# Patient Record
Sex: Male | Born: 1940 | Race: White | Hispanic: No | Smoking: Former smoker
Health system: Southern US, Community
[De-identification: ages and names within clinical notes are randomized; demographics above are authoritative.]

## PROBLEM LIST (undated history)

## (undated) DIAGNOSIS — I219 Acute myocardial infarction, unspecified: Secondary | ICD-10-CM

## (undated) DIAGNOSIS — I739 Peripheral vascular disease, unspecified: Secondary | ICD-10-CM

## (undated) DIAGNOSIS — I639 Cerebral infarction, unspecified: Secondary | ICD-10-CM

## (undated) DIAGNOSIS — T82218A Other mechanical complication of coronary artery bypass graft, initial encounter: Secondary | ICD-10-CM

## (undated) DIAGNOSIS — J449 Chronic obstructive pulmonary disease, unspecified: Secondary | ICD-10-CM

## (undated) DIAGNOSIS — F17200 Nicotine dependence, unspecified, uncomplicated: Secondary | ICD-10-CM

## (undated) DIAGNOSIS — J309 Allergic rhinitis, unspecified: Secondary | ICD-10-CM

## (undated) DIAGNOSIS — I10 Essential (primary) hypertension: Secondary | ICD-10-CM

## (undated) DIAGNOSIS — I4891 Unspecified atrial fibrillation: Secondary | ICD-10-CM

## (undated) DIAGNOSIS — R51 Headache: Secondary | ICD-10-CM

## (undated) DIAGNOSIS — K5909 Other constipation: Secondary | ICD-10-CM

## (undated) DIAGNOSIS — I499 Cardiac arrhythmia, unspecified: Secondary | ICD-10-CM

## (undated) DIAGNOSIS — E785 Hyperlipidemia, unspecified: Secondary | ICD-10-CM

## (undated) DIAGNOSIS — M1990 Unspecified osteoarthritis, unspecified site: Secondary | ICD-10-CM

## (undated) DIAGNOSIS — E871 Hypo-osmolality and hyponatremia: Secondary | ICD-10-CM

## (undated) DIAGNOSIS — R0602 Shortness of breath: Secondary | ICD-10-CM

## (undated) DIAGNOSIS — I2510 Atherosclerotic heart disease of native coronary artery without angina pectoris: Secondary | ICD-10-CM

## (undated) DIAGNOSIS — C801 Malignant (primary) neoplasm, unspecified: Secondary | ICD-10-CM

## (undated) DIAGNOSIS — M549 Dorsalgia, unspecified: Secondary | ICD-10-CM

## (undated) DIAGNOSIS — K219 Gastro-esophageal reflux disease without esophagitis: Secondary | ICD-10-CM

## (undated) DIAGNOSIS — N200 Calculus of kidney: Secondary | ICD-10-CM

## (undated) HISTORY — DX: Hypo-osmolality and hyponatremia: E87.1

## (undated) HISTORY — DX: Atherosclerotic heart disease of native coronary artery without angina pectoris: I25.10

## (undated) HISTORY — DX: Hyperlipidemia, unspecified: E78.5

## (undated) HISTORY — DX: Unspecified atrial fibrillation: I48.91

## (undated) HISTORY — DX: Other constipation: K59.09

## (undated) HISTORY — DX: Cerebral infarction, unspecified: I63.9

## (undated) HISTORY — DX: Peripheral vascular disease, unspecified: I73.9

## (undated) HISTORY — PX: HEMORROIDECTOMY: SUR656

## (undated) HISTORY — DX: Nicotine dependence, unspecified, uncomplicated: F17.200

## (undated) HISTORY — DX: Headache: R51

## (undated) HISTORY — DX: Allergic rhinitis, unspecified: J30.9

## (undated) HISTORY — DX: Dorsalgia, unspecified: M54.9

## (undated) HISTORY — DX: Calculus of kidney: N20.0

---

## 1979-06-19 HISTORY — PX: MULTIPLE TOOTH EXTRACTIONS: SHX2053

## 1989-06-18 HISTORY — PX: KNEE SURGERY: SHX244

## 2008-08-22 ENCOUNTER — Inpatient Hospital Stay: Payer: Self-pay | Admitting: Internal Medicine

## 2010-04-07 ENCOUNTER — Ambulatory Visit: Payer: Self-pay | Admitting: Family Medicine

## 2012-08-17 DIAGNOSIS — E7800 Pure hypercholesterolemia, unspecified: Secondary | ICD-10-CM | POA: Insufficient documentation

## 2012-08-17 DIAGNOSIS — R319 Hematuria, unspecified: Secondary | ICD-10-CM | POA: Insufficient documentation

## 2012-08-17 DIAGNOSIS — Z0189 Encounter for other specified special examinations: Secondary | ICD-10-CM | POA: Insufficient documentation

## 2012-08-17 DIAGNOSIS — Z720 Tobacco use: Secondary | ICD-10-CM | POA: Insufficient documentation

## 2012-08-17 DIAGNOSIS — M549 Dorsalgia, unspecified: Secondary | ICD-10-CM | POA: Insufficient documentation

## 2012-08-17 DIAGNOSIS — J301 Allergic rhinitis due to pollen: Secondary | ICD-10-CM | POA: Insufficient documentation

## 2012-08-17 DIAGNOSIS — J449 Chronic obstructive pulmonary disease, unspecified: Secondary | ICD-10-CM | POA: Insufficient documentation

## 2012-08-17 DIAGNOSIS — I10 Essential (primary) hypertension: Secondary | ICD-10-CM | POA: Insufficient documentation

## 2012-08-17 DIAGNOSIS — I639 Cerebral infarction, unspecified: Secondary | ICD-10-CM | POA: Insufficient documentation

## 2012-08-17 DIAGNOSIS — N529 Male erectile dysfunction, unspecified: Secondary | ICD-10-CM | POA: Insufficient documentation

## 2012-10-18 HISTORY — PX: CARDIAC CATHETERIZATION: SHX172

## 2013-03-07 ENCOUNTER — Ambulatory Visit: Payer: Self-pay | Admitting: Family Medicine

## 2013-07-16 ENCOUNTER — Ambulatory Visit (INDEPENDENT_AMBULATORY_CARE_PROVIDER_SITE_OTHER): Payer: Medicare Other | Admitting: Cardiovascular Disease

## 2013-07-16 ENCOUNTER — Encounter: Payer: Self-pay | Admitting: Cardiovascular Disease

## 2013-07-16 VITALS — BP 106/80 | HR 84 | Ht 69.0 in | Wt 160.5 lb

## 2013-07-16 DIAGNOSIS — I739 Peripheral vascular disease, unspecified: Secondary | ICD-10-CM | POA: Insufficient documentation

## 2013-07-16 DIAGNOSIS — F17200 Nicotine dependence, unspecified, uncomplicated: Secondary | ICD-10-CM

## 2013-07-16 DIAGNOSIS — R079 Chest pain, unspecified: Secondary | ICD-10-CM | POA: Insufficient documentation

## 2013-07-16 DIAGNOSIS — I10 Essential (primary) hypertension: Secondary | ICD-10-CM

## 2013-07-16 DIAGNOSIS — R0602 Shortness of breath: Secondary | ICD-10-CM

## 2013-07-16 DIAGNOSIS — E785 Hyperlipidemia, unspecified: Secondary | ICD-10-CM | POA: Insufficient documentation

## 2013-07-16 NOTE — Assessment & Plan Note (Signed)
Known lower extremity PAD. Followed by Dr. Lorretta Harp. Recommended smoking cessation

## 2013-07-16 NOTE — Patient Instructions (Addendum)
[facility] Cardiac Cath Instructions   You are scheduled for a Cardiac Cath on:_____THURSDAY, OCT 2____________________  Please arrive at __10:30___am on the day of your procedure  You will need to pre-register prior to the day of your procedure.  Enter through the [employer] at [facility].  Registration is the first desk on your right.  Please take the procedure order we have given you in order to be registered appropriately  Do not eat/drink anything after midnight  Someone will need to drive you home  It is recommended someone be with you for the first 24 hours after your procedure  Wear clothes that are easy to get on/off and wear slip on shoes if possible   Medications bring a current list of all medications with you   ___ Do not take these medications before your procedure:___LASIX_____   Day of your procedure: Arrive at the [facility] entrance.  Free valet service is available.  After entering the [facility] please check-in at the registration desk (1st desk on your right) to receive your armband. After receiving your armband someone will escort you to the cardiac cath/special procedures waiting area.  The usual length of stay after your procedure is about 2 to 3 hours.  This can vary.  If you have any questions, please call our office at [phone], or you may call the cardiac cath lab at [facility] directly at [phone]

## 2013-07-16 NOTE — Assessment & Plan Note (Signed)
We have encouraged him to continue to work on weaning his cigarettes and smoking cessation. He will continue to work on this and does not want any assistance with chantix.  

## 2013-07-16 NOTE — Assessment & Plan Note (Addendum)
Chest pain concerning for ischemia also in light of his long smoking history, known PAD, EKG abnormality. We have discussed the various options. He would prefer heart attack catheterization over a stress test. He is unable to treadmill. Catheterization will be scheduled for later this week. Risks and benefits were discussed with the patient. Chest x-ray lab work ordered today in preparation for catheterization.

## 2013-07-16 NOTE — Progress Notes (Signed)
Patient ID: Jerry Davenport, male    DOB: Sep 08, 1941, 72 y.o.   MRN: [id number]  HPI Comments: Jerry Davenport is a very pleasant 72 year old gentleman with a long history of smoking for more than 60 years,  hyperlipidemia, chronic headaches, PAD in the legs, works as a Hospital doctor, significant home stress (issues with his son ), who presents by referral from Dr. Juanetta Gosling for symptoms of chest pain and abnormal EKG.   Reports having episodes of chest pain lasting for a few seconds at a time dating back several weeks to months. He is uncertain if they come on with rest or stress. In general he is not very active secondary to underlying shortness of breath. He continues to smoke one pack per day. He is limited in his ability to exert himself secondary to PAD in his legs. He reports that his legs give out. He is followed by Dr. Lorretta Harp.   Chest pain is in his mediastinum, typically does not radiate down his arms or in his neck. Symptoms will resolve if he rests. 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 acetaminophen (TYLENOL) 500 MG tablet Take 500 mg by mouth as needed for pain.      Marland Kitchen aspirin 81 MG tablet Take 81 mg by mouth daily.      . furosemide (LASIX) 20 MG tablet Take 10 mg by mouth daily.      Marland Kitchen HYDROcodone-acetaminophen (NORCO/VICODIN) 5-325 MG per tablet Take 1 tablet by mouth as needed for pain.      . Loratadine (CLARITIN) 10 MG CAPS Take 5 mg by mouth daily.       . nitroGLYCERIN (NITROSTAT) 0.4 MG SL tablet Place 0.4 mg under  the tongue every 5 (five) minutes as needed for chest pain.      . pravastatin (PRAVACHOL) 40 MG tablet Take 40 mg by mouth daily.       No facility-administered encounter medications on file as of 07/16/2013.     Review of Systems  Constitutional: Negative.   HENT: Negative.   Eyes: Negative.   Respiratory: Positive for chest tightness and shortness of breath.   Cardiovascular: Positive for chest pain.  Gastrointestinal: Negative.   Endocrine: Negative.   Musculoskeletal: Negative.   Skin: Negative.   Allergic/Immunologic: Negative.   Neurological: Negative.   Hematological: Negative.   Psychiatric/Behavioral: Negative.   All other systems reviewed and are negative.    BP 106/80  Pulse 84  Ht 5\' 9"  (1.753 m)  Wt 160 lb 8 oz (72.802 kg)  BMI 23.69 kg/m2  Physical Exam  Nursing note and vitals reviewed. Constitutional: He is oriented to person, place, and time. He appears well-developed and well-nourished.  HENT:  Head: Normocephalic.  Nose: Nose normal.  Mouth/Throat: Oropharynx is clear and moist.  Eyes: Conjunctivae are normal. Pupils are equal, round, and reactive to light.  Neck: Normal range of motion. Neck supple. No JVD present.  Cardiovascular: Normal rate, regular rhythm, S1 normal, S2 normal,  normal heart sounds and intact distal pulses.  Exam reveals no gallop and no friction rub.   No murmur heard. Pulses:      Dorsalis pedis pulses are 0 on the right side, and 0 on the left side.       Posterior tibial pulses are 0 on the right side, and 0 on the left side.  Pulmonary/Chest: Effort normal. No respiratory distress. He has decreased breath sounds. He has no wheezes. He has no rales. He exhibits no tenderness.  Abdominal: Soft. Bowel sounds are normal. He exhibits no distension. There is no tenderness.  Musculoskeletal: Normal range of motion. He exhibits no edema and no tenderness.  Lymphadenopathy:    He has no cervical adenopathy.  Neurological: He is  alert and oriented to person, place, and time. Coordination normal.  Skin: Skin is warm and dry. No rash noted. No erythema.  Psychiatric: He has a normal mood and affect. His behavior is normal. Judgment and thought content normal.      Assessment and Plan

## 2013-07-16 NOTE — Assessment & Plan Note (Signed)
Encouraged him to stay on his pravastatin 

## 2013-07-17 ENCOUNTER — Other Ambulatory Visit: Payer: Self-pay

## 2013-07-17 DIAGNOSIS — R079 Chest pain, unspecified: Secondary | ICD-10-CM

## 2013-07-17 DIAGNOSIS — R0602 Shortness of breath: Secondary | ICD-10-CM

## 2013-07-17 LAB — CBC WITH DIFFERENTIAL/PLATELET
Basophils Absolute: 0.1 10*3/uL (ref 0.0–0.2)
Basos: 1 %
Immature Grans (Abs): 0 10*3/uL (ref 0.0–0.1)
Immature Granulocytes: 0 %
Lymphs: 24 %
MCHC: 35.7 g/dL (ref 31.5–35.7)
Monocytes: 4 %
Neutrophils Relative %: 66 %
RDW: 15.8 % — ABNORMAL HIGH (ref 12.3–15.4)

## 2013-07-17 LAB — BASIC METABOLIC PANEL
BUN/Creatinine Ratio: 16 (ref 10–22)
BUN: 15 mg/dL (ref 8–27)
CO2: 24 mmol/L (ref 18–29)
Calcium: 9.2 mg/dL (ref 8.6–10.2)
Chloride: 104 mmol/L (ref 97–108)
Creatinine, Ser: 0.93 mg/dL (ref 0.76–1.27)
GFR calc non Af Amer: 81 mL/min/{1.73_m2} (ref >59)
Glucose: 114 mg/dL — ABNORMAL HIGH (ref 65–99)

## 2013-07-17 LAB — PROTIME-INR
INR: 1.1 (ref 0.8–1.2)
Prothrombin Time: 10.8 s (ref 9.1–12.0)

## 2013-07-18 DIAGNOSIS — T82218A Other mechanical complication of coronary artery bypass graft, initial encounter: Secondary | ICD-10-CM

## 2013-07-18 HISTORY — DX: Other mechanical complication of coronary artery bypass graft, initial encounter: T82.218A

## 2013-07-19 ENCOUNTER — Ambulatory Visit: Payer: Self-pay | Admitting: Cardiovascular Disease

## 2013-07-19 ENCOUNTER — Encounter: Payer: Self-pay | Admitting: Cardiovascular Disease

## 2013-07-19 DIAGNOSIS — I2510 Atherosclerotic heart disease of native coronary artery without angina pectoris: Secondary | ICD-10-CM

## 2013-07-19 DIAGNOSIS — I739 Peripheral vascular disease, unspecified: Secondary | ICD-10-CM

## 2013-07-26 ENCOUNTER — Encounter: Payer: Self-pay | Admitting: Cardiovascular Disease

## 2013-07-26 ENCOUNTER — Ambulatory Visit (INDEPENDENT_AMBULATORY_CARE_PROVIDER_SITE_OTHER): Payer: Medicare Other | Admitting: Cardiovascular Disease

## 2013-07-26 VITALS — BP 120/74 | HR 72 | Ht 69.0 in | Wt 158.5 lb

## 2013-07-26 DIAGNOSIS — I2510 Atherosclerotic heart disease of native coronary artery without angina pectoris: Secondary | ICD-10-CM

## 2013-07-26 DIAGNOSIS — E785 Hyperlipidemia, unspecified: Secondary | ICD-10-CM

## 2013-07-26 DIAGNOSIS — I739 Peripheral vascular disease, unspecified: Secondary | ICD-10-CM

## 2013-07-26 DIAGNOSIS — R0602 Shortness of breath: Secondary | ICD-10-CM

## 2013-07-26 DIAGNOSIS — F17200 Nicotine dependence, unspecified, uncomplicated: Secondary | ICD-10-CM

## 2013-07-26 DIAGNOSIS — R079 Chest pain, unspecified: Secondary | ICD-10-CM

## 2013-07-26 MED ORDER — METOPROLOL TARTRATE 25 MG PO TABS: 25.0000 mg | ORAL_TABLET | Freq: Two times a day (BID) | ORAL | Status: DC

## 2013-07-26 NOTE — Assessment & Plan Note (Signed)
Recommended he continue on a statin. Goal LDL less than 70.

## 2013-07-26 NOTE — Assessment & Plan Note (Signed)
Severe CAD on recent cardiac catheterization. We have discussed this with him and his various options. He would likely best be served by evaluation from cardiothoracic surgery for bypass surgery. We'll try to obtain his most recent carotid ultrasound from Dr. Lorretta Harp. He is interested in getting back to work as he supports his family. We will start metoprolol 12.5 mg twice a day, continue aspirin and statin.

## 2013-07-26 NOTE — Patient Instructions (Addendum)
Please start metoprolol 1/2 pill twice a day  We will set up an appt with the cardiothorasic surgeons for possible bypass surgery  Please call us if you have new issues that need to be addressed before your next appt.  Your physician wants you to follow-up in: 6 weeks  You will receive a reminder letter in the mail two months in advance. If you don't receive a letter, please call our office to schedule the follow-up appointment.

## 2013-07-26 NOTE — Assessment & Plan Note (Signed)
Followed by Dr. Schneir 

## 2013-07-26 NOTE — Assessment & Plan Note (Addendum)
Strongly recommended he quit smoking. He realizes this and will continue to try.

## 2013-07-26 NOTE — Assessment & Plan Note (Addendum)
Underlying shortness of breath from COPD and underlying ischemia. Currently not on inhalers.

## 2013-07-26 NOTE — Progress Notes (Signed)
Patient ID: Jerry Davenport, male    DOB: November 19, 1940, 72 y.o.   MRN: [id number]  HPI Comments: Jerry Davenport is a very pleasant 72 year old gentleman with a long history of smoking for more than 60 years, who continues to smoke (though trying to quit),  hyperlipidemia, chronic headaches, PAD in the legs followed by Dr. 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 Juanetta Gosling who initially presented with symptoms of chest pain and abnormal EKG.   Jerry Davenport reported having episodes of chest pain lasting for a few seconds at a time dating back several weeks to months. In general Jerry Davenport is not very active secondary to underlying shortness of breath. Chest pain is in his mediastinum, typically does not radiate down his arms or in his neck. Symptoms will resolve if Jerry Davenport rests.   Recent cardiac catheterization for his symptoms confirmed severe three-vessel disease. Jerry Davenport had occluded mid RCA, occluded mid left circumflex, patent ramus and patent LAD with severe stenotic lesion in the proximal to mid LAD estimated at 80%. It collaterals extending to the distal RCA and left circumflex. Images were shown to Dr. Kirke Corin and after a long discussion, decision was made for referral to cardiothoracic surgery. At the time of the catheterization Jerry Davenport was unsure and wanted to think about the decision.  Jerry Davenport presents today for further discussion.   Jerry Davenport recovered well from his cardiac catheterization. No significant groin bruising.  EKG shows normal sinus rhythm with rate 72 beats per minute, nonspecific ST abnormality in V5, V6, T wave abnormality 2, 3, aVF    Outpatient Encounter Prescriptions as of 07/26/2013  Medication Sig Dispense Refill  . acetaminophen (TYLENOL) 500 MG tablet Take 500 mg by mouth as needed for pain.      Marland Kitchen aspirin 81 MG tablet Take 81 mg by mouth daily.      . furosemide (LASIX) 20 MG tablet Take 10 mg by mouth daily.      Marland Kitchen HYDROcodone-acetaminophen  (NORCO/VICODIN) 5-325 MG per tablet Take 1 tablet by mouth as needed for pain.      . Loratadine (CLARITIN) 10 MG CAPS Take 5 mg by mouth daily.       . nitroGLYCERIN (NITROSTAT) 0.4 MG SL tablet Place 0.4 mg under the tongue every 5 (five) minutes as needed for chest pain.      . pravastatin (PRAVACHOL) 40 MG tablet Take 40 mg by mouth daily.       No facility-administered encounter medications on file as of 07/26/2013.     Review of Systems  Constitutional: Negative.   HENT: Negative.   Eyes: Negative.   Respiratory: Positive for chest tightness and shortness of breath.   Cardiovascular: Positive for chest pain.  Gastrointestinal: Negative.   Endocrine: Negative.   Musculoskeletal: Negative.   Skin: Negative.   Allergic/Immunologic: Negative.   Neurological: Negative.   Hematological: Negative.   Psychiatric/Behavioral: Negative.   All other systems reviewed and are negative.    BP 120/74  Pulse 72  Ht 5\' 9"  (1.753 m)  Wt 158 lb 8 oz (71.895 kg)  BMI 23.4 kg/m2  Physical Exam  Nursing note and vitals reviewed. Constitutional: Jerry Davenport is oriented to person, place, and time. Jerry Davenport appears well-developed and well-nourished.  HENT:  Head: Normocephalic.  Nose: Nose normal.  Mouth/Throat: Oropharynx is clear and moist.  Eyes: Conjunctivae are normal. Pupils are equal, round, and reactive to light.  Neck: Normal range of motion. Neck supple.  No JVD present.  Cardiovascular: Normal rate, regular rhythm, S1 normal, S2 normal, normal heart sounds and intact distal pulses.  Exam reveals no gallop and no friction rub.   No murmur heard. Pulses:      Dorsalis pedis pulses are 0 on the right side, and 0 on the left side.       Posterior tibial pulses are 0 on the right side, and 0 on the left side.  Pulmonary/Chest: Effort normal. No respiratory distress. Jerry Davenport has decreased breath sounds. Jerry Davenport has no wheezes. Jerry Davenport has no rales. Jerry Davenport exhibits no tenderness.  Abdominal: Soft. Bowel sounds are  normal. Jerry Davenport exhibits no distension. There is no tenderness.  Musculoskeletal: Normal range of motion. Jerry Davenport exhibits no edema and no tenderness.  Lymphadenopathy:    Jerry Davenport has no cervical adenopathy.  Neurological: Jerry Davenport is alert and oriented to person, place, and time. Coordination normal.  Skin: Skin is warm and dry. No rash noted. No erythema.  Psychiatric: Jerry Davenport has a normal mood and affect. His behavior is normal. Judgment and thought content normal.      Assessment and Plan

## 2013-07-26 NOTE — Assessment & Plan Note (Signed)
He continues to have chest pain symptoms from underlying ischemia. Will refer to CT surgery.

## 2013-08-07 ENCOUNTER — Encounter: Payer: Self-pay | Admitting: Thoracic Surgery (Cardiothoracic Vascular Surgery)

## 2013-08-07 ENCOUNTER — Other Ambulatory Visit: Payer: Self-pay

## 2013-08-07 ENCOUNTER — Institutional Professional Consult (permissible substitution) (INDEPENDENT_AMBULATORY_CARE_PROVIDER_SITE_OTHER): Payer: Medicare Other | Admitting: Thoracic Surgery (Cardiothoracic Vascular Surgery)

## 2013-08-07 VITALS — BP 113/72 | HR 71 | Resp 16 | Ht 69.0 in | Wt 158.0 lb

## 2013-08-07 DIAGNOSIS — I2510 Atherosclerotic heart disease of native coronary artery without angina pectoris: Secondary | ICD-10-CM

## 2013-08-07 DIAGNOSIS — R319 Hematuria, unspecified: Secondary | ICD-10-CM | POA: Insufficient documentation

## 2013-08-07 DIAGNOSIS — J449 Chronic obstructive pulmonary disease, unspecified: Secondary | ICD-10-CM

## 2013-08-07 NOTE — Progress Notes (Signed)
PCP is HAWKINS,JAMES H, MD Referring Provider is Gollan, Timothy J, MD  Chief Complaint  Patient presents with  . Coronary Artery Disease    Surgical eval for severe 3 vessel disease, Cardiac Cath 07/19/13    HPI: 72-year-old gentleman presents for evaluation of three-vessel coronary disease.  Jerry Davenport is a 72-year-old gentleman who recently had a DOT physical and was found to have been initiated on. He went to Dr. Hawkins and as part of his evaluation had an EKG. 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 He was referred to Dr. Gollan and a cardiac catheterization. That revealed severe three-vessel coronary disease with an 80% LAD stenosis and total occlusion of the right coronary and left circumflex, which filled via collaterals from the LAD. Left ventriculogram showed inferior akinesis.  Jerry Davenport is a poor historian. He says that he has occasional chest pain. He describes this as a sharp pain just to the left of the sternum that comes on unpredictably in the last up to 15 minutes. He does get short of breath with exertion and sometimes has to stop while walking into the grocery store. He smoked about a pack a day for 60 years but says he has been trying to cut back. He currently is smoking 4-5 cigarettes a day.     Past Medical History  Diagnosis Date  . Hyposmolality and/or hyponatremia   . Other and unspecified hyperlipidemia   . Headache(784.0)   . Backache, unspecified   . Allergic rhinitis, cause unspecified   . Peripheral vascular disease, unspecified   . Tobacco use disorder   . Other constipation   . Stroke   . Coronary artery disease     Past Surgical History  Procedure Laterality Date  . Hemorroidectomy    . Cardiac catheterization  2014    [facility]    Family History  Problem Relation Age of Onset  . Hypertension Mother     Social History History  Substance Use Topics  . Smoking status: Current Every Day Smoker -- 1.00 packs/day for 60 years    Types: Cigarettes  .  Smokeless tobacco: Never Used     Comment: down to 2-4 cigs a day now  . Alcohol Use: No    Current Outpatient Prescriptions  Medication Sig Dispense Refill  . acetaminophen (TYLENOL) 500 MG tablet Take 500 mg by mouth as needed for pain.      . aspirin 81 MG tablet Take 81 mg by mouth daily.      . furosemide (LASIX) 20 MG tablet Take 10 mg by mouth daily.      . HYDROcodone-acetaminophen (NORCO/VICODIN) 5-325 MG per tablet Take 1 tablet by mouth as needed for pain.      . Loratadine (CLARITIN) 10 MG CAPS Take 5 mg by mouth daily as needed.       . metoprolol tartrate (LOPRESSOR) 25 MG tablet Take 12.5 mg by mouth 2 (two) times daily.      . nitroGLYCERIN (NITROSTAT) 0.4 MG SL tablet Place 0.4 mg under the tongue every 5 (five) minutes as needed for chest pain.      . pravastatin (PRAVACHOL) 40 MG tablet Take 40 mg by mouth daily.       No current facility-administered medications for this visit.    No Known Allergies  Review of Systems  Constitutional: Positive for fatigue.  Respiratory: Positive for cough, shortness of breath and wheezing.   Cardiovascular: Positive for chest pain.       "blocked artery   right leg"  Genitourinary: Positive for hematuria.  Musculoskeletal:       Right hip pain  Neurological: Negative.   Hematological: Does not bruise/bleed easily.  All other systems reviewed and are negative.    BP 113/72  Pulse 71  Resp 16  Ht 5' 9" (1.753 m)  Wt 158 lb (71.668 kg)  BMI 23.32 kg/m2  SpO2 98% Physical Exam  Vitals reviewed. Constitutional: He is oriented to person, place, and time. He appears well-developed and well-nourished. No distress.  HENT:  Head: Normocephalic and atraumatic.  Eyes: Pupils are equal, round, and reactive to light.  Neck: Normal range of motion. Neck supple. No thyromegaly present.  No carotid bruits  Cardiovascular: Normal rate and regular rhythm.   Difficult to auscultate HS, no murmur noted  Pulmonary/Chest: Effort normal.  He has no wheezes. He has no rales.  Diminished BS bilaterally  Abdominal: Soft. There is no tenderness.  Musculoskeletal:  +clubbing, no cyanosis or edema  Lymphadenopathy:    He has no cervical adenopathy.  Neurological: He is alert and oriented to person, place, and time. No cranial nerve deficit.  No focal motor deficit  Skin: Skin is warm and dry.     Diagnostic Tests: Cardiac catheterization as previously noted  CBC    Component Value Date/Time   WBC 6.0 07/16/2013 0907   RBC 3.35* 07/16/2013 0907   HGB 13.0 07/16/2013 0907   HCT 36.4* 07/16/2013 0907   MCV 109* 07/16/2013 0907   MCH 38.8* 07/16/2013 0907   MCHC 35.7 07/16/2013 0907   RDW 15.8* 07/16/2013 0907   LYMPHSABS 1.4 07/16/2013 0907   EOSABS 0.3 07/16/2013 0907   BASOSABS 0.1 07/16/2013 0907    BMET    Component Value Date/Time   NA 140 07/16/2013 0907   K 4.8 07/16/2013 0907   CL 104 07/16/2013 0907   CO2 24 07/16/2013 0907   GLUCOSE 114* 07/16/2013 0907   BUN 15 07/16/2013 0907   CREATININE 0.93 07/16/2013 0907   CALCIUM 9.2 07/16/2013 0907   GFRNONAA 81 07/16/2013 0907   GFRAA 94 07/16/2013 0907     Impression: Jerry Davenport is a 72-year-old gentleman who has a history of tobacco abuse and hypertension and who has severe three-vessel coronary disease with total occlusion of the circumflex and right coronary and 80% stenosis in his LAD. 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  Coronary bypass grafting is indicated for survival benefit. Relief of symptoms is a little harder to predict as he is a poor historian. I'm not sure that the chest pain he describes anginal in nature. There is a high likelihood however that his coronary disease is contributing to his shortness of breath.  I did counsel him on smoking cessation.  I discussed with the patient and his wife the general nature of the procedure, the need for general anesthesia, and the incisions to be used. I discussed the expected hospital stay,  overall recovery and short and long term outcomes. They understand the risks include, but are not limited to, death, stroke, MI, DVT/PE, bleeding, possible need for transfusion, infections, cardiac arrhythmias, and other organ system dysfunction including respiratory, renal, or GI complications. He accepts these risks and agrees to proceed.   We will attempt to harvest the vein from his left leg. I'm not sure the symptoms he describes with his left hip his claudication, so if necessary we will take vein from the right leg as well.      Plan: Coronary bypass grafting x4 on Monday, October 27  He does need pulmonary function tests preoperatively so we can assess his COPD.  

## 2013-08-08 ENCOUNTER — Encounter (HOSPITAL_COMMUNITY): Payer: Self-pay | Admitting: Pharmacy Technician

## 2013-08-09 ENCOUNTER — Encounter (HOSPITAL_COMMUNITY)
Admission: RE | Admit: 2013-08-09 | Discharge: 2013-08-09 | Disposition: A | Discharge status: Home / Self Care | Payer: Medicare Other | Source: Ambulatory Visit | Attending: Thoracic Surgery (Cardiothoracic Vascular Surgery) | Admitting: Thoracic Surgery (Cardiothoracic Vascular Surgery)

## 2013-08-09 ENCOUNTER — Ambulatory Visit (HOSPITAL_COMMUNITY)
Admission: RE | Admit: 2013-08-09 | Discharge: 2013-08-09 | Disposition: A | Discharge status: Home / Self Care | Payer: Medicare Other | Source: Ambulatory Visit | Attending: Thoracic Surgery (Cardiothoracic Vascular Surgery) | Admitting: Thoracic Surgery (Cardiothoracic Vascular Surgery)

## 2013-08-09 ENCOUNTER — Encounter (HOSPITAL_COMMUNITY): Payer: Self-pay

## 2013-08-09 VITALS — BP 130/81 | HR 66 | Temp 97.7°F | Resp 20 | Ht 69.0 in | Wt 156.5 lb

## 2013-08-09 DIAGNOSIS — Z01818 Encounter for other preprocedural examination: Secondary | ICD-10-CM | POA: Insufficient documentation

## 2013-08-09 DIAGNOSIS — Z01810 Encounter for preprocedural cardiovascular examination: Secondary | ICD-10-CM | POA: Insufficient documentation

## 2013-08-09 DIAGNOSIS — R0609 Other forms of dyspnea: Secondary | ICD-10-CM | POA: Insufficient documentation

## 2013-08-09 DIAGNOSIS — I2510 Atherosclerotic heart disease of native coronary artery without angina pectoris: Secondary | ICD-10-CM | POA: Insufficient documentation

## 2013-08-09 DIAGNOSIS — R0989 Other specified symptoms and signs involving the circulatory and respiratory systems: Secondary | ICD-10-CM | POA: Insufficient documentation

## 2013-08-09 DIAGNOSIS — R9431 Abnormal electrocardiogram [ECG] [EKG]: Secondary | ICD-10-CM | POA: Insufficient documentation

## 2013-08-09 DIAGNOSIS — I6529 Occlusion and stenosis of unspecified carotid artery: Secondary | ICD-10-CM | POA: Insufficient documentation

## 2013-08-09 DIAGNOSIS — I739 Peripheral vascular disease, unspecified: Secondary | ICD-10-CM | POA: Insufficient documentation

## 2013-08-09 DIAGNOSIS — F17200 Nicotine dependence, unspecified, uncomplicated: Secondary | ICD-10-CM | POA: Insufficient documentation

## 2013-08-09 DIAGNOSIS — R079 Chest pain, unspecified: Secondary | ICD-10-CM | POA: Insufficient documentation

## 2013-08-09 DIAGNOSIS — K219 Gastro-esophageal reflux disease without esophagitis: Secondary | ICD-10-CM | POA: Insufficient documentation

## 2013-08-09 DIAGNOSIS — I10 Essential (primary) hypertension: Secondary | ICD-10-CM | POA: Insufficient documentation

## 2013-08-09 DIAGNOSIS — J438 Other emphysema: Secondary | ICD-10-CM | POA: Insufficient documentation

## 2013-08-09 DIAGNOSIS — Z01812 Encounter for preprocedural laboratory examination: Secondary | ICD-10-CM | POA: Insufficient documentation

## 2013-08-09 DIAGNOSIS — E785 Hyperlipidemia, unspecified: Secondary | ICD-10-CM | POA: Insufficient documentation

## 2013-08-09 HISTORY — DX: Acute myocardial infarction, unspecified: I21.9

## 2013-08-09 HISTORY — DX: Shortness of breath: R06.02

## 2013-08-09 HISTORY — DX: Unspecified osteoarthritis, unspecified site: M19.90

## 2013-08-09 HISTORY — DX: Essential (primary) hypertension: I10

## 2013-08-09 HISTORY — DX: Gastro-esophageal reflux disease without esophagitis: K21.9

## 2013-08-09 LAB — PULMONARY FUNCTION TEST

## 2013-08-09 LAB — URINALYSIS, ROUTINE W REFLEX MICROSCOPIC
Bilirubin Urine: NEGATIVE
Glucose, UA: NEGATIVE mg/dL
Ketones, ur: NEGATIVE mg/dL
Leukocytes, UA: NEGATIVE
Nitrite: NEGATIVE
Protein, ur: NEGATIVE mg/dL
Specific Gravity, Urine: 1.007 (ref 1.005–1.030)
Urobilinogen, UA: 1 mg/dL (ref 0.0–1.0)
pH: 7 (ref 5.0–8.0)

## 2013-08-09 LAB — COMPREHENSIVE METABOLIC PANEL
ALT: 11 U/L (ref 0–53)
AST: 13 U/L (ref 0–37)
Albumin: 4 g/dL (ref 3.5–5.2)
Alkaline Phosphatase: 90 U/L (ref 39–117)
CO2: 23 mEq/L (ref 19–32)
Chloride: 101 mEq/L (ref 96–112)
GFR calc non Af Amer: 69 mL/min — ABNORMAL LOW (ref >90)
Potassium: 4 mEq/L (ref 3.5–5.1)
Sodium: 137 mEq/L (ref 135–145)
Total Bilirubin: 0.5 mg/dL (ref 0.3–1.2)

## 2013-08-09 LAB — BLOOD GAS, ARTERIAL
Acid-base deficit: 1 mmol/L (ref 0.0–2.0)
Bicarbonate: 22.5 meq/L (ref 20.0–24.0)
Drawn by: 344381
FIO2: 0.21 %
O2 Saturation: 80.3 %
Patient temperature: 98.6
TCO2: 23.6 mmol/L (ref 0–100)
pCO2 arterial: 33.4 mmHg — ABNORMAL LOW (ref 35.0–45.0)
pH, Arterial: 7.444 (ref 7.350–7.450)
pO2, Arterial: 42.7 mmHg — ABNORMAL LOW (ref 80.0–100.0)

## 2013-08-09 LAB — APTT: aPTT: 37 seconds (ref 24–37)

## 2013-08-09 LAB — PROTIME-INR: INR: 1.01 (ref 0.00–1.49)

## 2013-08-09 LAB — CBC
Hemoglobin: 13.4 g/dL (ref 13.0–17.0)
MCH: 40.6 pg — ABNORMAL HIGH (ref 26.0–34.0)
Platelets: 120 10*3/uL — ABNORMAL LOW (ref 150–400)
RBC: 3.3 MIL/uL — ABNORMAL LOW (ref 4.22–5.81)
RDW: 14 % (ref 11.5–15.5)
WBC: 6.5 10*3/uL (ref 4.0–10.5)

## 2013-08-09 LAB — URINE MICROSCOPIC-ADD ON

## 2013-08-09 LAB — SURGICAL PCR SCREEN: Staphylococcus aureus: NEGATIVE

## 2013-08-09 LAB — ABO/RH: ABO/RH(D): A POS

## 2013-08-09 MED ORDER — ALBUTEROL SULFATE (5 MG/ML) 0.5% IN NEBU
2.5000 mg | INHALATION_SOLUTION | Freq: Once | RESPIRATORY_TRACT | Status: AC
  Administered 2013-08-09: 2.5 mg via RESPIRATORY_TRACT

## 2013-08-09 NOTE — Pre-Procedure Instructions (Signed)
Kyden Potash  08/09/2013   Your procedure is scheduled on:  08/13/2013, MONDAY  Report to [facility]  2 * 3  ENTRANCE A at   5:30 AM.  Call this number if you have problems the morning of surgery: [phone]   Remember:   Do not eat food or drink liquids after midnight.  On Sunday   Take these medicines the morning of surgery with A SIP OF WATER:  Metoprolol   Do not wear jewelry   Do not wear lotions, powders, or perfumes. You may wear deodorant.   Men may shave face and neck.   Do not bring valuables to the hospital.  [facility] is not responsible                  for any belongings or valuables.               Contacts, dentures or bridgework may not be worn into surgery.  Leave suitcase in the car. After surgery it may be brought to your room.  For patients admitted to the hospital, discharge time is determined by your                treatment team.               Patients discharged the day of surgery will not be allowed to drive  home.  Name and phone number of your driver:  family  Special Instructions: Shower using CHG 2 nights before surgery and the night before surgery.  If you shower the day of surgery use CHG.  Use special wash - you have one bottle of CHG for all showers.  You should use approximately 1/3 of the bottle for each shower.   Please read over the following fact sheets that you were given: Pain Booklet, Coughing and Deep Breathing, Blood Transfusion Information, Open Heart Packet, MRSA Information and Surgical Site Infection Prevention

## 2013-08-09 NOTE — Progress Notes (Addendum)
Anesthesia PAT Evaluation:  Patient is a 72 year old male scheduled for CABG on 08/13/13 by Dr. Dorris Fetch.  History includes CAD, smoking,HLD, HTN, CVA, PAD (Dr. Lorretta Harp), GERD, arthritis, hyponatremia. He was recently referred to cardiologist Dr. Mariah Milling for chest pain with an abnormal EKG.  Cardiac cath showed 3VCAD and patient was referred to [facility] for CABG.  PCP is listed as Dr. Venora Maples. I evaluated him during his PAT visit on 08/09/13.  Cardiac cath on 07/19/13 [facility]; scanned under Media tab) showed severe three-vessel disease. He had occluded mid RCA, occluded mid left circumflex, patent ramus and patent LAD with severe stenotic lesion in the proximal to mid LAD estimated at 80%. It collaterals extending to the distal RCA and left circumflex. Images were shown to Dr. Kirke Corin and after a long discussion, decision was made for referral to cardiothoracic surgery.  EKG on 08/09/13 showed SB, inferior infract (age undetermined), cannot rule out anterior infarct (age undetermined).  He had two EKGs that were done minutes prior but he was shivering and baseline artifact made those too difficult to accurately interpret.     Patient denied current or recent chest pain.  No new SOB.  Heart RRR with distant heart sounds.  Lungs clear but diminished.  He carries his Nitro with him, but has not had to use it.   CXR on 08/09/13 showed: Advanced emphysematous changes but no acute overlying pulmonary process.   PFTs on 08/09/13 showed FVC 3.40 (82%), FEV1 1.98 (65%), DLCOunc 13.26 (42%).  Carotid duplex on 08/09/13 showed bilateral 1-39% ICA stenosis, antegrade vertebral flow.    ABI's on 08/09/13 showed: Patient has known PAD. ABI indicates a moderate reduction in arterial flow on the right and a severe reduction on the left. The anterior tibial artery bilaterally could not be evaluated due to barely audible signals  Preoperative labs noted.  ABG and PFT results called to [facility] RN Ryan.  She will have Dr.  Dorris Fetch review for additional recommendations, if any. (Update 4:30 PM:  Dr. Dorris Fetch felt ABG was likely a venous stick.  He plans to proceed with surgery as planned.)  Velna Ochs [facility] Short Stay Center/Anesthesiology Phone [phone] 08/10/2013 9:40 AM

## 2013-08-09 NOTE — Progress Notes (Signed)
VASCULAR LAB PRELIMINARY  PRELIMINARY  PRELIMINARY  PRELIMINARY  Pre-op Cardiac Surgery  Carotid Findings:  Bilateral 1% to 39% ICA stenosis. Vertebral artery flow is antegrade.  Upper Extremity Right Left  Brachial Pressures 125 Triphasic 135 Triphasic  Radial Waveforms Triphasic Triphasic  Ulnar Waveforms Triphasic Triphasic  Palmar Arch (Allen's Test) Normal Normal   Findings:  Doppler waveforms remained normal bilaterally with both radial and ulnar compressions    Lower  Extremity Right Left  Anterior Tibial Barely audible Barely audible  Posterior Tibial 80 Monophasic 52 Dampened monophasic  Ankle/Brachial Indices 0.59 0.39    Findings: Patient has a known PAD with rest pain. ABI on the right indicates a moderate reduction in artery flow. Left ABI indicates a severe reduction in arterial flow. Posterior tibial artery bilaterally could not be evaluated due to barely audible Doppler signals.   Reggie Welge, RVS 08/09/2013, 6:46 PM

## 2013-08-09 NOTE — Progress Notes (Signed)
Call to A. Zelenak,PAC to review ekg.  First 2 tracings of  EKG recorded while pt. was shivering & feeling very cold.

## 2013-08-10 LAB — HEMOGLOBIN A1C: Mean Plasma Glucose: 111 mg/dL (ref <117)

## 2013-08-12 MED ORDER — CHLORHEXIDINE GLUCONATE 4 % EX LIQD: 30.0000 mL | CUTANEOUS | Status: DC

## 2013-08-12 MED ORDER — PAPAVERINE HCL 30 MG/ML IJ SOLN
INTRAMUSCULAR | Status: AC
  Administered 2013-08-13: 07:00:00
  Filled 2013-08-12: qty 2.5

## 2013-08-12 MED ORDER — POTASSIUM CHLORIDE 2 MEQ/ML IV SOLN
80.0000 meq | INTRAVENOUS | Status: DC
  Filled 2013-08-12: qty 40

## 2013-08-12 MED ORDER — VANCOMYCIN HCL 10 G IV SOLR
1250.0000 mg | INTRAVENOUS | Status: AC
  Administered 2013-08-13: 1250 mg via INTRAVENOUS
  Filled 2013-08-12: qty 1250

## 2013-08-12 MED ORDER — METOPROLOL TARTRATE 12.5 MG HALF TABLET: 12.5000 mg | ORAL_TABLET | Freq: Once | ORAL | Status: DC

## 2013-08-12 MED ORDER — HEPARIN SODIUM (PORCINE) 1000 UNIT/ML IJ SOLN
INTRAMUSCULAR | Status: DC
  Filled 2013-08-12: qty 30

## 2013-08-12 MED ORDER — DEXTROSE 5 % IV SOLN
1.5000 g | INTRAVENOUS | Status: AC
  Administered 2013-08-13: .75 g via INTRAVENOUS
  Administered 2013-08-13: 1.5 g via INTRAVENOUS
  Filled 2013-08-12: qty 1.5

## 2013-08-12 MED ORDER — DEXTROSE 5 % IV SOLN
750.0000 mg | INTRAVENOUS | Status: DC
  Filled 2013-08-12: qty 750

## 2013-08-12 MED ORDER — MAGNESIUM SULFATE 50 % IJ SOLN
40.0000 meq | INTRAMUSCULAR | Status: DC
  Filled 2013-08-12: qty 10

## 2013-08-12 MED ORDER — NITROGLYCERIN IN D5W 200-5 MCG/ML-% IV SOLN
2.0000 ug/min | INTRAVENOUS | Status: AC
  Administered 2013-08-13: 16 ug/min via INTRAVENOUS
  Filled 2013-08-12: qty 250

## 2013-08-12 MED ORDER — DEXMEDETOMIDINE HCL IN NACL 400 MCG/100ML IV SOLN
0.1000 ug/kg/h | INTRAVENOUS | Status: AC
  Administered 2013-08-13: 0.2 ug/kg/h via INTRAVENOUS
  Filled 2013-08-12: qty 100

## 2013-08-12 MED ORDER — EPINEPHRINE HCL 1 MG/ML IJ SOLN
0.5000 ug/min | INTRAVENOUS | Status: DC
  Filled 2013-08-12: qty 4

## 2013-08-12 MED ORDER — DOPAMINE-DEXTROSE 3.2-5 MG/ML-% IV SOLN
2.0000 ug/kg/min | INTRAVENOUS | Status: AC
  Administered 2013-08-13: 3 ug/kg/min via INTRAVENOUS
  Filled 2013-08-12 (×2): qty 250

## 2013-08-12 MED ORDER — AMINOCAPROIC ACID 250 MG/ML IV SOLN
INTRAVENOUS | Status: AC
  Administered 2013-08-13: 69 mL/h via INTRAVENOUS
  Filled 2013-08-12: qty 40

## 2013-08-12 MED ORDER — SODIUM CHLORIDE 0.9 % IV SOLN
INTRAVENOUS | Status: AC
  Administered 2013-08-13: 1.4 [IU]/h via INTRAVENOUS
  Filled 2013-08-12: qty 1

## 2013-08-12 MED ORDER — PHENYLEPHRINE HCL 10 MG/ML IJ SOLN
30.0000 ug/min | INTRAVENOUS | Status: AC
  Administered 2013-08-13: 5 ug/min via INTRAVENOUS
  Filled 2013-08-12: qty 2

## 2013-08-13 ENCOUNTER — Inpatient Hospital Stay (HOSPITAL_COMMUNITY): Payer: Medicare Other | Admitting: Anesthesiology

## 2013-08-13 ENCOUNTER — Inpatient Hospital Stay (HOSPITAL_COMMUNITY): Payer: Medicare Other

## 2013-08-13 ENCOUNTER — Encounter (HOSPITAL_COMMUNITY): Payer: Medicare Other | Admitting: Vascular Surgery

## 2013-08-13 ENCOUNTER — Encounter (HOSPITAL_COMMUNITY): Payer: Self-pay | Admitting: *Deleted

## 2013-08-13 ENCOUNTER — Encounter (HOSPITAL_COMMUNITY)
Admission: RE | Disposition: A | Discharge status: Home / Self Care | Payer: Medicare Other | Source: Ambulatory Visit | Attending: Thoracic Surgery (Cardiothoracic Vascular Surgery)

## 2013-08-13 ENCOUNTER — Inpatient Hospital Stay (HOSPITAL_COMMUNITY)
Admission: RE | Admit: 2013-08-13 | Discharge: 2013-08-20 | DRG: 236 | Disposition: A | Discharge status: Home / Self Care | Payer: Medicare Other | Source: Ambulatory Visit | Attending: Thoracic Surgery (Cardiothoracic Vascular Surgery) | Admitting: Thoracic Surgery (Cardiothoracic Vascular Surgery)

## 2013-08-13 DIAGNOSIS — I252 Old myocardial infarction: Secondary | ICD-10-CM

## 2013-08-13 DIAGNOSIS — D62 Acute posthemorrhagic anemia: Secondary | ICD-10-CM | POA: Diagnosis not present

## 2013-08-13 DIAGNOSIS — R0602 Shortness of breath: Secondary | ICD-10-CM | POA: Diagnosis present

## 2013-08-13 DIAGNOSIS — I739 Peripheral vascular disease, unspecified: Secondary | ICD-10-CM | POA: Diagnosis present

## 2013-08-13 DIAGNOSIS — E8779 Other fluid overload: Secondary | ICD-10-CM | POA: Diagnosis not present

## 2013-08-13 DIAGNOSIS — D6959 Other secondary thrombocytopenia: Secondary | ICD-10-CM | POA: Diagnosis not present

## 2013-08-13 DIAGNOSIS — I2510 Atherosclerotic heart disease of native coronary artery without angina pectoris: Secondary | ICD-10-CM

## 2013-08-13 DIAGNOSIS — I4891 Unspecified atrial fibrillation: Secondary | ICD-10-CM | POA: Diagnosis not present

## 2013-08-13 DIAGNOSIS — I498 Other specified cardiac arrhythmias: Secondary | ICD-10-CM | POA: Diagnosis not present

## 2013-08-13 DIAGNOSIS — R0902 Hypoxemia: Secondary | ICD-10-CM | POA: Diagnosis not present

## 2013-08-13 DIAGNOSIS — I517 Cardiomegaly: Secondary | ICD-10-CM | POA: Diagnosis present

## 2013-08-13 DIAGNOSIS — Z8673 Personal history of transient ischemic attack (TIA), and cerebral infarction without residual deficits: Secondary | ICD-10-CM

## 2013-08-13 DIAGNOSIS — I10 Essential (primary) hypertension: Secondary | ICD-10-CM | POA: Diagnosis present

## 2013-08-13 DIAGNOSIS — Z951 Presence of aortocoronary bypass graft: Secondary | ICD-10-CM

## 2013-08-13 DIAGNOSIS — I059 Rheumatic mitral valve disease, unspecified: Secondary | ICD-10-CM | POA: Diagnosis present

## 2013-08-13 DIAGNOSIS — E785 Hyperlipidemia, unspecified: Secondary | ICD-10-CM | POA: Diagnosis present

## 2013-08-13 DIAGNOSIS — F17200 Nicotine dependence, unspecified, uncomplicated: Secondary | ICD-10-CM | POA: Diagnosis present

## 2013-08-13 DIAGNOSIS — J9819 Other pulmonary collapse: Secondary | ICD-10-CM | POA: Diagnosis not present

## 2013-08-13 HISTORY — PX: CORONARY ARTERY BYPASS GRAFT: SHX141

## 2013-08-13 LAB — CBC
HCT: 26.3 % — ABNORMAL LOW (ref 39.0–52.0)
Hemoglobin: 9.5 g/dL — ABNORMAL LOW (ref 13.0–17.0)
MCH: 40.1 pg — ABNORMAL HIGH (ref 26.0–34.0)
MCH: 40.7 pg — ABNORMAL HIGH (ref 26.0–34.0)
MCHC: 36.8 g/dL — ABNORMAL HIGH (ref 30.0–36.0)
MCV: 111 fL — ABNORMAL HIGH (ref 78.0–100.0)
Platelets: 104 10*3/uL — ABNORMAL LOW (ref 150–400)
RBC: 2.09 MIL/uL — ABNORMAL LOW (ref 4.22–5.81)
RDW: 13.9 % (ref 11.5–15.5)
RDW: 13.9 % (ref 11.5–15.5)
WBC: 8.2 10*3/uL (ref 4.0–10.5)

## 2013-08-13 LAB — POCT I-STAT 4, (NA,K, GLUC, HGB,HCT)
Glucose, Bld: 106 mg/dL — ABNORMAL HIGH (ref 70–99)
Glucose, Bld: 91 mg/dL (ref 70–99)
Glucose, Bld: 100 mg/dL — ABNORMAL HIGH (ref 70–99)
Glucose, Bld: 136 mg/dL — ABNORMAL HIGH (ref 70–99)
Glucose, Bld: 125 mg/dL — ABNORMAL HIGH (ref 70–99)
HCT: 27 % — ABNORMAL LOW (ref 39.0–52.0)
HCT: 21 % — ABNORMAL LOW (ref 39.0–52.0)
HCT: 22 % — ABNORMAL LOW (ref 39.0–52.0)
HCT: 22 % — ABNORMAL LOW (ref 39.0–52.0)
HCT: 27 % — ABNORMAL LOW (ref 39.0–52.0)
Hemoglobin: 9.2 g/dL — ABNORMAL LOW (ref 13.0–17.0)
Hemoglobin: 7.1 g/dL — ABNORMAL LOW (ref 13.0–17.0)
Hemoglobin: 7.5 g/dL — ABNORMAL LOW (ref 13.0–17.0)
Hemoglobin: 9.2 g/dL — ABNORMAL LOW (ref 13.0–17.0)
Potassium: 4.1 mEq/L (ref 3.5–5.1)
Potassium: 4 mEq/L (ref 3.5–5.1)
Potassium: 4.2 mEq/L (ref 3.5–5.1)
Potassium: 5.5 mEq/L — ABNORMAL HIGH (ref 3.5–5.1)
Potassium: 4.8 mEq/L (ref 3.5–5.1)
Potassium: 3.9 mEq/L (ref 3.5–5.1)
Sodium: 140 mEq/L (ref 135–145)
Sodium: 138 mEq/L (ref 135–145)

## 2013-08-13 LAB — GLUCOSE, CAPILLARY
Glucose-Capillary: 99 mg/dL (ref 70–99)
Glucose-Capillary: 141 mg/dL — ABNORMAL HIGH (ref 70–99)
Glucose-Capillary: 126 mg/dL — ABNORMAL HIGH (ref 70–99)
Glucose-Capillary: 145 mg/dL — ABNORMAL HIGH (ref 70–99)
Glucose-Capillary: 129 mg/dL — ABNORMAL HIGH (ref 70–99)
Glucose-Capillary: 116 mg/dL — ABNORMAL HIGH (ref 70–99)
Glucose-Capillary: 104 mg/dL — ABNORMAL HIGH (ref 70–99)

## 2013-08-13 LAB — POCT I-STAT, CHEM 8
BUN: 10 mg/dL (ref 6–23)
Calcium, Ion: 1.19 mmol/L (ref 1.13–1.30)
Chloride: 105 mEq/L (ref 96–112)
Hemoglobin: 7.5 g/dL — ABNORMAL LOW (ref 13.0–17.0)
TCO2: 21 mmol/L (ref 0–100)

## 2013-08-13 LAB — POCT I-STAT 3, ART BLOOD GAS (G3+)
Acid-base deficit: 2 mmol/L (ref 0.0–2.0)
Acid-base deficit: 3 mmol/L — ABNORMAL HIGH (ref 0.0–2.0)
Bicarbonate: 23.7 mEq/L (ref 20.0–24.0)
Bicarbonate: 22.4 mEq/L (ref 20.0–24.0)
O2 Saturation: 91 %
O2 Saturation: 94 %
Patient temperature: 36.4
Patient temperature: 37.6
TCO2: 25 mmol/L (ref 0–100)
TCO2: 24 mmol/L (ref 0–100)
TCO2: 24 mmol/L (ref 0–100)
pCO2 arterial: 45.2 mmHg — ABNORMAL HIGH (ref 35.0–45.0)
pH, Arterial: 7.327 — ABNORMAL LOW (ref 7.350–7.450)
pH, Arterial: 7.353 (ref 7.350–7.450)
pO2, Arterial: 405 mmHg — ABNORMAL HIGH (ref 80.0–100.0)

## 2013-08-13 LAB — CREATININE, SERUM
Creatinine, Ser: 0.78 mg/dL (ref 0.50–1.35)
GFR calc Af Amer: >90 mL/min (ref >90)
GFR calc non Af Amer: 87 mL/min — ABNORMAL LOW (ref >90)

## 2013-08-13 LAB — PLATELET COUNT: Platelets: 79 10*3/uL — ABNORMAL LOW (ref 150–400)

## 2013-08-13 LAB — HEMOGLOBIN AND HEMATOCRIT, BLOOD: Hemoglobin: 8 g/dL — ABNORMAL LOW (ref 13.0–17.0)

## 2013-08-13 LAB — APTT: aPTT: 41 seconds — ABNORMAL HIGH (ref 24–37)

## 2013-08-13 LAB — PROTIME-INR
INR: 1.53 — ABNORMAL HIGH (ref 0.00–1.49)
Prothrombin Time: 18 seconds — ABNORMAL HIGH (ref 11.6–15.2)

## 2013-08-13 LAB — MAGNESIUM: Magnesium: 2.9 mg/dL — ABNORMAL HIGH (ref 1.5–2.5)

## 2013-08-13 SURGERY — CORONARY ARTERY BYPASS GRAFTING (CABG)
Anesthesia: General | Site: Chest | Wound class: Clean

## 2013-08-13 MED ORDER — INSULIN REGULAR BOLUS VIA INFUSION
0.0000 [IU] | Freq: Three times a day (TID) | INTRAVENOUS | Status: DC
  Filled 2013-08-13: qty 10

## 2013-08-13 MED ORDER — PHENYLEPHRINE HCL 10 MG/ML IJ SOLN
0.0000 ug/min | INTRAVENOUS | Status: DC
  Filled 2013-08-13: qty 2

## 2013-08-13 MED ORDER — DOCUSATE SODIUM 100 MG PO CAPS
200.0000 mg | ORAL_CAPSULE | Freq: Every day | ORAL | Status: DC
  Administered 2013-08-14 – 2013-08-20 (×6): 200 mg via ORAL
  Filled 2013-08-13 (×7): qty 2

## 2013-08-13 MED ORDER — METOPROLOL TARTRATE 1 MG/ML IV SOLN: 2.5000 mg | INTRAVENOUS | Status: DC | PRN

## 2013-08-13 MED ORDER — LIDOCAINE HCL (CARDIAC) 20 MG/ML IV SOLN
INTRAVENOUS | Status: DC | PRN
  Administered 2013-08-13: 100 mg via INTRAVENOUS

## 2013-08-13 MED ORDER — HEMOSTATIC AGENTS (NO CHARGE) OPTIME
TOPICAL | Status: DC | PRN
  Administered 2013-08-13: 1 via TOPICAL

## 2013-08-13 MED ORDER — FAMOTIDINE IN NACL 20-0.9 MG/50ML-% IV SOLN
20.0000 mg | Freq: Two times a day (BID) | INTRAVENOUS | Status: DC
  Administered 2013-08-13: 20 mg via INTRAVENOUS

## 2013-08-13 MED ORDER — OXYCODONE HCL 5 MG PO TABS
5.0000 mg | ORAL_TABLET | ORAL | Status: DC | PRN
  Administered 2013-08-14: 5 mg via ORAL
  Administered 2013-08-15: 10 mg via ORAL
  Administered 2013-08-20 (×2): 5 mg via ORAL
  Filled 2013-08-13 (×3): qty 1
  Filled 2013-08-13: qty 2

## 2013-08-13 MED ORDER — VANCOMYCIN HCL IN DEXTROSE 1-5 GM/200ML-% IV SOLN
1000.0000 mg | Freq: Once | INTRAVENOUS | Status: AC
  Administered 2013-08-13: 1000 mg via INTRAVENOUS
  Filled 2013-08-13: qty 200

## 2013-08-13 MED ORDER — LEVALBUTEROL HCL 0.63 MG/3ML IN NEBU
0.6300 mg | INHALATION_SOLUTION | Freq: Four times a day (QID) | RESPIRATORY_TRACT | Status: DC
  Administered 2013-08-13 – 2013-08-17 (×18): 0.63 mg via RESPIRATORY_TRACT
  Filled 2013-08-13 (×31): qty 3

## 2013-08-13 MED ORDER — DOPAMINE-DEXTROSE 3.2-5 MG/ML-% IV SOLN: 2.0000 ug/kg/min | INTRAVENOUS | Status: DC

## 2013-08-13 MED ORDER — DEXTROSE 5 % IV SOLN
1.5000 g | Freq: Two times a day (BID) | INTRAVENOUS | Status: AC
  Administered 2013-08-13 – 2013-08-15 (×4): 1.5 g via INTRAVENOUS
  Filled 2013-08-13 (×4): qty 1.5

## 2013-08-13 MED ORDER — MAGNESIUM SULFATE 40 MG/ML IJ SOLN
4.0000 g | Freq: Once | INTRAMUSCULAR | Status: AC
  Administered 2013-08-13: 4 g via INTRAVENOUS
  Filled 2013-08-13: qty 100

## 2013-08-13 MED ORDER — ACETAMINOPHEN 650 MG RE SUPP
650.0000 mg | Freq: Once | RECTAL | Status: AC
  Administered 2013-08-13: 650 mg via RECTAL

## 2013-08-13 MED ORDER — LACTATED RINGERS IV SOLN
INTRAVENOUS | Status: DC | PRN
  Administered 2013-08-13 (×2): via INTRAVENOUS

## 2013-08-13 MED ORDER — ACETAMINOPHEN 160 MG/5ML PO SOLN
1000.0000 mg | Freq: Four times a day (QID) | ORAL | Status: AC
  Filled 2013-08-13: qty 40

## 2013-08-13 MED ORDER — POTASSIUM CHLORIDE 10 MEQ/50ML IV SOLN
10.0000 meq | Freq: Once | INTRAVENOUS | Status: AC
  Administered 2013-08-13: 10 meq via INTRAVENOUS

## 2013-08-13 MED ORDER — PANTOPRAZOLE SODIUM 40 MG PO TBEC
40.0000 mg | DELAYED_RELEASE_TABLET | Freq: Every day | ORAL | Status: DC
  Administered 2013-08-15 – 2013-08-20 (×6): 40 mg via ORAL
  Filled 2013-08-13 (×6): qty 1

## 2013-08-13 MED ORDER — SODIUM CHLORIDE 0.9 % IJ SOLN: 3.0000 mL | INTRAMUSCULAR | Status: DC | PRN

## 2013-08-13 MED ORDER — SODIUM CHLORIDE 0.9 % IV SOLN
INTRAVENOUS | Status: DC
  Administered 2013-08-13 (×2): via INTRAVENOUS

## 2013-08-13 MED ORDER — SODIUM CHLORIDE 0.9 % IJ SOLN
3.0000 mL | Freq: Two times a day (BID) | INTRAMUSCULAR | Status: DC
  Administered 2013-08-14 – 2013-08-15 (×3): 3 mL via INTRAVENOUS

## 2013-08-13 MED ORDER — METOCLOPRAMIDE HCL 5 MG/ML IJ SOLN
10.0000 mg | Freq: Four times a day (QID) | INTRAMUSCULAR | Status: AC
  Administered 2013-08-13 – 2013-08-14 (×4): 10 mg via INTRAVENOUS
  Filled 2013-08-13 (×4): qty 2

## 2013-08-13 MED ORDER — SODIUM CHLORIDE 0.9 % IV SOLN
INTRAVENOUS | Status: DC
  Administered 2013-08-13: 20:00:00 via INTRAVENOUS
  Filled 2013-08-13 (×2): qty 1

## 2013-08-13 MED ORDER — METOPROLOL TARTRATE 25 MG/10 ML ORAL SUSPENSION
12.5000 mg | Freq: Two times a day (BID) | ORAL | Status: DC
  Filled 2013-08-13 (×7): qty 5

## 2013-08-13 MED ORDER — ARTIFICIAL TEARS OP OINT
TOPICAL_OINTMENT | OPHTHALMIC | Status: DC | PRN
  Administered 2013-08-13: 1 via OPHTHALMIC

## 2013-08-13 MED ORDER — FENTANYL CITRATE 0.05 MG/ML IJ SOLN
INTRAMUSCULAR | Status: DC | PRN
  Administered 2013-08-13: 250 ug via INTRAVENOUS
  Administered 2013-08-13 (×2): 50 ug via INTRAVENOUS
  Administered 2013-08-13: 250 ug via INTRAVENOUS
  Administered 2013-08-13: 50 ug via INTRAVENOUS
  Administered 2013-08-13: 100 ug via INTRAVENOUS
  Administered 2013-08-13 (×3): 250 ug via INTRAVENOUS

## 2013-08-13 MED ORDER — ACETAMINOPHEN 500 MG PO TABS
1000.0000 mg | ORAL_TABLET | Freq: Four times a day (QID) | ORAL | Status: AC
  Administered 2013-08-14 – 2013-08-18 (×15): 1000 mg via ORAL
  Filled 2013-08-13 (×21): qty 2

## 2013-08-13 MED ORDER — METOPROLOL TARTRATE 12.5 MG HALF TABLET
12.5000 mg | ORAL_TABLET | Freq: Two times a day (BID) | ORAL | Status: DC
  Administered 2013-08-14 – 2013-08-15 (×4): 12.5 mg via ORAL
  Filled 2013-08-13 (×7): qty 1

## 2013-08-13 MED ORDER — ROCURONIUM BROMIDE 100 MG/10ML IV SOLN
INTRAVENOUS | Status: DC | PRN
  Administered 2013-08-13 (×2): 50 mg via INTRAVENOUS

## 2013-08-13 MED ORDER — PROTAMINE SULFATE 10 MG/ML IV SOLN
INTRAVENOUS | Status: DC | PRN
  Administered 2013-08-13: 230 mg via INTRAVENOUS

## 2013-08-13 MED ORDER — 0.9 % SODIUM CHLORIDE (POUR BTL) OPTIME
TOPICAL | Status: DC | PRN
  Administered 2013-08-13: 6000 mL

## 2013-08-13 MED ORDER — SODIUM CHLORIDE 0.45 % IV SOLN
INTRAVENOUS | Status: DC
  Administered 2013-08-13: 14:00:00 via INTRAVENOUS

## 2013-08-13 MED ORDER — ACETAMINOPHEN 160 MG/5ML PO SOLN: 650.0000 mg | Freq: Once | ORAL | Status: AC

## 2013-08-13 MED ORDER — ASPIRIN EC 325 MG PO TBEC
325.0000 mg | DELAYED_RELEASE_TABLET | Freq: Every day | ORAL | Status: DC
  Administered 2013-08-15 – 2013-08-20 (×6): 325 mg via ORAL
  Filled 2013-08-13 (×7): qty 1

## 2013-08-13 MED ORDER — SODIUM CHLORIDE 0.9 % IV SOLN: 250.0000 mL | INTRAVENOUS | Status: DC

## 2013-08-13 MED ORDER — LACTATED RINGERS IV SOLN
INTRAVENOUS | Status: DC | PRN
  Administered 2013-08-13: 07:00:00 via INTRAVENOUS

## 2013-08-13 MED ORDER — ASPIRIN 81 MG PO CHEW
324.0000 mg | CHEWABLE_TABLET | Freq: Every day | ORAL | Status: DC
  Administered 2013-08-14: 324 mg
  Filled 2013-08-13: qty 4

## 2013-08-13 MED ORDER — VECURONIUM BROMIDE 10 MG IV SOLR
INTRAVENOUS | Status: DC | PRN
  Administered 2013-08-13 (×2): 5 mg via INTRAVENOUS

## 2013-08-13 MED ORDER — DEXMEDETOMIDINE HCL IN NACL 200 MCG/50ML IV SOLN
0.1000 ug/kg/h | INTRAVENOUS | Status: DC
  Administered 2013-08-13: 0.5 ug/kg/h via INTRAVENOUS
  Filled 2013-08-13: qty 50

## 2013-08-13 MED ORDER — NITROGLYCERIN IN D5W 200-5 MCG/ML-% IV SOLN: 0.0000 ug/min | INTRAVENOUS | Status: DC

## 2013-08-13 MED ORDER — ONDANSETRON HCL 4 MG/2ML IJ SOLN
4.0000 mg | Freq: Four times a day (QID) | INTRAMUSCULAR | Status: DC | PRN
  Administered 2013-08-13 – 2013-08-17 (×6): 4 mg via INTRAVENOUS
  Filled 2013-08-13 (×7): qty 2

## 2013-08-13 MED ORDER — PROPOFOL 10 MG/ML IV BOLUS
INTRAVENOUS | Status: DC | PRN
  Administered 2013-08-13: 100 mg via INTRAVENOUS

## 2013-08-13 MED ORDER — SODIUM CHLORIDE 0.9 % IJ SOLN
OROMUCOSAL | Status: DC | PRN
  Administered 2013-08-13 (×3): via TOPICAL

## 2013-08-13 MED ORDER — SIMVASTATIN 20 MG PO TABS
20.0000 mg | ORAL_TABLET | Freq: Every day | ORAL | Status: DC
  Administered 2013-08-14 – 2013-08-19 (×6): 20 mg via ORAL
  Filled 2013-08-13 (×8): qty 1

## 2013-08-13 MED ORDER — MIDAZOLAM HCL 2 MG/2ML IJ SOLN: 2.0000 mg | INTRAMUSCULAR | Status: DC | PRN

## 2013-08-13 MED ORDER — BISACODYL 5 MG PO TBEC
10.0000 mg | DELAYED_RELEASE_TABLET | Freq: Every day | ORAL | Status: DC
  Administered 2013-08-14 – 2013-08-20 (×5): 10 mg via ORAL
  Filled 2013-08-13 (×5): qty 2

## 2013-08-13 MED ORDER — MIDAZOLAM HCL 5 MG/5ML IJ SOLN
INTRAMUSCULAR | Status: DC | PRN
  Administered 2013-08-13 (×2): 2 mg via INTRAVENOUS
  Administered 2013-08-13: 3 mg via INTRAVENOUS
  Administered 2013-08-13: 2 mg via INTRAVENOUS
  Administered 2013-08-13 (×2): 4 mg via INTRAVENOUS

## 2013-08-13 MED ORDER — LACTATED RINGERS IV SOLN: 500.0000 mL | Freq: Once | INTRAVENOUS | Status: AC | PRN

## 2013-08-13 MED ORDER — LACTATED RINGERS IV SOLN
INTRAVENOUS | Status: DC
  Administered 2013-08-13: 18:00:00 via INTRAVENOUS

## 2013-08-13 MED ORDER — HEPARIN SODIUM (PORCINE) 1000 UNIT/ML IJ SOLN
INTRAMUSCULAR | Status: DC | PRN
  Administered 2013-08-13: 5000 [IU] via INTRAVENOUS
  Administered 2013-08-13: 8000 [IU] via INTRAVENOUS
  Administered 2013-08-13: 5000 [IU] via INTRAVENOUS
  Administered 2013-08-13: 2000 [IU] via INTRAVENOUS
  Administered 2013-08-13: 5000 [IU] via INTRAVENOUS

## 2013-08-13 MED ORDER — ALBUMIN HUMAN 5 % IV SOLN
250.0000 mL | INTRAVENOUS | Status: AC | PRN
  Administered 2013-08-13 – 2013-08-14 (×4): 250 mL via INTRAVENOUS
  Filled 2013-08-13 (×2): qty 250

## 2013-08-13 MED ORDER — MORPHINE SULFATE 2 MG/ML IJ SOLN
2.0000 mg | INTRAMUSCULAR | Status: DC | PRN
  Administered 2013-08-13 – 2013-08-14 (×3): 2 mg via INTRAVENOUS
  Filled 2013-08-13 (×5): qty 1

## 2013-08-13 MED ORDER — MORPHINE SULFATE 2 MG/ML IJ SOLN: 1.0000 mg | INTRAMUSCULAR | Status: AC | PRN

## 2013-08-13 MED ORDER — POTASSIUM CHLORIDE 10 MEQ/50ML IV SOLN
10.0000 meq | INTRAVENOUS | Status: AC
  Administered 2013-08-13 (×3): 10 meq via INTRAVENOUS

## 2013-08-13 MED ORDER — SODIUM CHLORIDE 0.9 % IV SOLN
INTRAVENOUS | Status: DC | PRN
  Administered 2013-08-13: 12:00:00 via INTRAVENOUS

## 2013-08-13 MED ORDER — BISACODYL 10 MG RE SUPP: 10.0000 mg | Freq: Every day | RECTAL | Status: DC

## 2013-08-13 MED FILL — Potassium Chloride Inj 2 mEq/ML: INTRAVENOUS | Qty: 40 | Status: AC

## 2013-08-13 MED FILL — Electrolyte-R (PH 7.4) Solution: INTRAVENOUS | Qty: 3000 | Status: AC

## 2013-08-13 MED FILL — Magnesium Sulfate Inj 50%: INTRAMUSCULAR | Qty: 10 | Status: AC

## 2013-08-13 MED FILL — Mannitol IV Soln 20%: INTRAVENOUS | Qty: 500 | Status: AC

## 2013-08-13 MED FILL — Heparin Sodium (Porcine) Inj 1000 Unit/ML: INTRAMUSCULAR | Qty: 30 | Status: AC

## 2013-08-13 MED FILL — Sodium Bicarbonate IV Soln 8.4%: INTRAVENOUS | Qty: 50 | Status: AC

## 2013-08-13 MED FILL — Lidocaine HCl IV Inj 20 MG/ML: INTRAVENOUS | Qty: 5 | Status: AC

## 2013-08-13 MED FILL — Sodium Chloride Irrigation Soln 0.9%: Qty: 3000 | Status: AC

## 2013-08-13 MED FILL — Sodium Chloride IV Soln 0.9%: INTRAVENOUS | Qty: 1000 | Status: AC

## 2013-08-13 MED FILL — Heparin Sodium (Porcine) Inj 1000 Unit/ML: INTRAMUSCULAR | Qty: 10 | Status: AC

## 2013-08-13 SURGICAL SUPPLY — 90 items
ATTRACTOMAT 16X20 MAGNETIC DRP (DRAPES) ×2 IMPLANT
BAG DECANTER FOR FLEXI CONT (MISCELLANEOUS) ×2 IMPLANT
BANDAGE ELASTIC 4 VELCRO ST LF (GAUZE/BANDAGES/DRESSINGS) ×2 IMPLANT
BANDAGE ELASTIC 6 VELCRO ST LF (GAUZE/BANDAGES/DRESSINGS) ×2 IMPLANT
BANDAGE GAUZE ELAST BULKY 4 IN (GAUZE/BANDAGES/DRESSINGS) ×2 IMPLANT
BASKET HEART (ORDER IN 25'S) (MISCELLANEOUS) ×1
BASKET HEART (ORDER IN 25S) (MISCELLANEOUS) ×1 IMPLANT
BENZOIN TINCTURE PRP APPL 2/3 (GAUZE/BANDAGES/DRESSINGS) ×2 IMPLANT
BLADE STERNUM SYSTEM 6 (BLADE) ×2 IMPLANT
BLADE SURG 11 STRL SS (BLADE) ×2 IMPLANT
CANISTER SUCTION 2500CC (MISCELLANEOUS) ×2 IMPLANT
CANNULA EZ GLIDE AORTIC 21FR (CANNULA) ×2 IMPLANT
CANNULA VENOUS LOW PROF 34X46 (CANNULA) IMPLANT
CARDIAC SUCTION (MISCELLANEOUS) ×2 IMPLANT
CATH CPB KIT HENDRICKSON (MISCELLANEOUS) ×2 IMPLANT
CATH ROBINSON RED A/P 18FR (CATHETERS) ×2 IMPLANT
CATH THORACIC 36FR (CATHETERS) ×2 IMPLANT
CATH THORACIC 36FR RT ANG (CATHETERS) ×2 IMPLANT
CLIP RETRACTION 3.0MM CORONARY (MISCELLANEOUS) ×2 IMPLANT
CLIP TI MEDIUM 24 (CLIP) IMPLANT
CLIP TI WIDE RED SMALL 24 (CLIP) ×6 IMPLANT
CLSR STERI-STRIP ANTIMIC 1/2X4 (GAUZE/BANDAGES/DRESSINGS) ×2 IMPLANT
COVER SURGICAL LIGHT HANDLE (MISCELLANEOUS) ×2 IMPLANT
CRADLE DONUT ADULT HEAD (MISCELLANEOUS) ×2 IMPLANT
DRAPE CARDIOVASCULAR INCISE (DRAPES) ×1
DRAPE SLUSH/WARMER DISC (DRAPES) ×2 IMPLANT
DRAPE SRG 135X102X78XABS (DRAPES) ×1 IMPLANT
DRSG COVADERM 4X14 (GAUZE/BANDAGES/DRESSINGS) ×2 IMPLANT
ELECT REM PT RETURN 9FT ADLT (ELECTROSURGICAL) ×4
ELECTRODE REM PT RTRN 9FT ADLT (ELECTROSURGICAL) ×2 IMPLANT
GLOVE BIO SURGEON STRL SZ 6.5 (GLOVE) ×14 IMPLANT
GLOVE EUDERMIC 7 POWDERFREE (GLOVE) ×6 IMPLANT
GOWN PREVENTION PLUS XLARGE (GOWN DISPOSABLE) ×4 IMPLANT
GOWN STRL NON-REIN LRG LVL3 (GOWN DISPOSABLE) ×12 IMPLANT
HEMOSTAT POWDER SURGIFOAM 1G (HEMOSTASIS) ×6 IMPLANT
HEMOSTAT SURGICEL 2X14 (HEMOSTASIS) ×2 IMPLANT
INSERT FOGARTY XLG (MISCELLANEOUS) ×2 IMPLANT
IV CATH 22GX1 FEP (IV SOLUTION) ×2 IMPLANT
KIT BASIN OR (CUSTOM PROCEDURE TRAY) ×2 IMPLANT
KIT ROOM TURNOVER OR (KITS) ×2 IMPLANT
KIT SUCTION CATH 14FR (SUCTIONS) ×4 IMPLANT
KIT VASOVIEW W/TROCAR VH 2000 (KITS) ×2 IMPLANT
MARKER GRAFT CORONARY BYPASS (MISCELLANEOUS) ×6 IMPLANT
NS IRRIG 1000ML POUR BTL (IV SOLUTION) ×12 IMPLANT
PACK OPEN HEART (CUSTOM PROCEDURE TRAY) ×2 IMPLANT
PAD ARMBOARD 7.5X6 YLW CONV (MISCELLANEOUS) ×4 IMPLANT
PAD ELECT DEFIB RADIOL ZOLL (MISCELLANEOUS) ×2 IMPLANT
PENCIL BUTTON HOLSTER BLD 10FT (ELECTRODE) ×2 IMPLANT
PUNCH AORTIC ROTATE 4.0MM (MISCELLANEOUS) ×2 IMPLANT
PUNCH AORTIC ROTATE 4.5MM 8IN (MISCELLANEOUS) IMPLANT
PUNCH AORTIC ROTATE 5MM 8IN (MISCELLANEOUS) IMPLANT
SET CARDIOPLEGIA MPS 5001102 (MISCELLANEOUS) ×2 IMPLANT
SPONGE GAUZE 4X4 12PLY (GAUZE/BANDAGES/DRESSINGS) ×4 IMPLANT
STRIP CLOSURE SKIN 1/2X4 (GAUZE/BANDAGES/DRESSINGS) ×2 IMPLANT
SUT BONE WAX W31G (SUTURE) ×2 IMPLANT
SUT MNCRL AB 4-0 PS2 18 (SUTURE) ×2 IMPLANT
SUT PROLENE 3 0 SH DA (SUTURE) ×2 IMPLANT
SUT PROLENE 4 0 RB 1 (SUTURE) ×1
SUT PROLENE 4 0 SH DA (SUTURE) IMPLANT
SUT PROLENE 4-0 RB1 .5 CRCL 36 (SUTURE) ×1 IMPLANT
SUT PROLENE 6 0 C 1 24 (SUTURE) ×2 IMPLANT
SUT PROLENE 6 0 C 1 30 (SUTURE) ×4 IMPLANT
SUT PROLENE 7 0 BV 1 (SUTURE) ×4 IMPLANT
SUT PROLENE 7 0 BV1 MDA (SUTURE) ×4 IMPLANT
SUT PROLENE 8 0 BV175 6 (SUTURE) IMPLANT
SUT SILK  1 MH (SUTURE)
SUT SILK 1 MH (SUTURE) IMPLANT
SUT STEEL 6MS V (SUTURE) ×2 IMPLANT
SUT STEEL STERNAL CCS#1 18IN (SUTURE) IMPLANT
SUT STEEL SZ 6 DBL 3X14 BALL (SUTURE) ×2 IMPLANT
SUT VIC AB 1 CTX 36 (SUTURE) ×2
SUT VIC AB 1 CTX36XBRD ANBCTR (SUTURE) ×2 IMPLANT
SUT VIC AB 2-0 CT1 27 (SUTURE) ×1
SUT VIC AB 2-0 CT1 TAPERPNT 27 (SUTURE) ×1 IMPLANT
SUT VIC AB 2-0 CTX 27 (SUTURE) IMPLANT
SUT VIC AB 3-0 SH 27 (SUTURE)
SUT VIC AB 3-0 SH 27X BRD (SUTURE) IMPLANT
SUT VIC AB 3-0 X1 27 (SUTURE) IMPLANT
SUT VICRYL 4-0 PS2 18IN ABS (SUTURE) IMPLANT
SUTURE E-PAK OPEN HEART (SUTURE) ×2 IMPLANT
SYSTEM SAHARA CHEST DRAIN ATS (WOUND CARE) ×2 IMPLANT
TAPE CLOTH SURG 4X10 WHT LF (GAUZE/BANDAGES/DRESSINGS) ×2 IMPLANT
TAPE PAPER 2X10 WHT MICROPORE (GAUZE/BANDAGES/DRESSINGS) ×2 IMPLANT
TOWEL OR 17X24 6PK STRL BLUE (TOWEL DISPOSABLE) ×4 IMPLANT
TOWEL OR 17X26 10 PK STRL BLUE (TOWEL DISPOSABLE) ×4 IMPLANT
TRAY FOLEY IC TEMP SENS 14FR (CATHETERS) ×2 IMPLANT
TUBING INSUFFLATION (TUBING) ×2 IMPLANT
TUBING INSUFFLATION 10FT LAP (TUBING) ×2 IMPLANT
UNDERPAD 30X30 INCONTINENT (UNDERPADS AND DIAPERS) ×2 IMPLANT
WATER STERILE IRR 1000ML POUR (IV SOLUTION) ×4 IMPLANT

## 2013-08-13 NOTE — Preoperative (Signed)
Beta Blockers   Reason not to administer Beta Blockers:Not Applicable 

## 2013-08-13 NOTE — Anesthesia Preprocedure Evaluation (Signed)
Anesthesia Evaluation  Patient identified by MRN, date of birth, ID band Patient awake    Reviewed: Allergy & Precautions, H&P , NPO status , Patient's Chart, lab work & pertinent test results  Airway Mallampati: II TM Distance: >3 FB Neck ROM: Full    Dental  (+) Edentulous Upper   Pulmonary  breath sounds clear to auscultation        Cardiovascular Rhythm:Regular Rate:Normal     Neuro/Psych    GI/Hepatic   Endo/Other    Renal/GU      Musculoskeletal   Abdominal   Peds  Hematology   Anesthesia Other Findings   Reproductive/Obstetrics                           Anesthesia Physical Anesthesia Plan  ASA: III  Anesthesia Plan: General   Post-op Pain Management:    Induction: Intravenous  Airway Management Planned: Oral ETT  Additional Equipment: Arterial line, 3D TEE and Ultrasound Guidance Line Placement  Intra-op Plan:   Post-operative Plan: Post-operative intubation/ventilation  Informed Consent: I have reviewed the patients History and Physical, chart, labs and discussed the procedure including the risks, benefits and alternatives for the proposed anesthesia with the patient or authorized representative who has indicated his/her understanding and acceptance.     Plan Discussed with: CRNA and Anesthesiologist  Anesthesia Plan Comments:         Anesthesia Quick Evaluation

## 2013-08-13 NOTE — Anesthesia Procedure Notes (Addendum)
Procedure Name: Intubation Date/Time: 08/13/2013 8:00 AM Performed by: Sherie Don Pre-anesthesia Checklist: Patient identified, Emergency Drugs available, Suction available, Patient being monitored and Timeout performed Patient Re-evaluated:Patient Re-evaluated prior to inductionOxygen Delivery Method: Circle system utilized Preoxygenation: Pre-oxygenation with 100% oxygen Intubation Type: IV induction Ventilation: Mask ventilation without difficulty and Oral airway inserted - appropriate to patient size Laryngoscope Size: Mac and 4 Grade View: Grade I Tube type: Oral Tube size: 8.0 mm Number of attempts: 1 Airway Equipment and Method: Stylet Placement Confirmation: ETT inserted through vocal cords under direct vision,  positive ETCO2 and breath sounds checked- equal and bilateral Secured at: 22 cm Tube secured with: Tape Dental Injury: Teeth and Oropharynx as per pre-operative assessment     The patient was identified and consent obtained.  TO was performed, and full barrier precautions were used.  The skin was anesthetized with lidocaine.  Once the vein was located with the 22 ga. needle using ultrasound guidance , the wire was inserted into the vein.  The wire location was confirmed with ultrasound.  The insertion site was dilated and the introducer was carefully inserted and sutured in place. The PAC was checked, and floated into the PA.  Once in the PA, the catheter was secured. The patient tolerated the procedure well.  CXR was ordered for PACU. Start: 0454 End: 0718 J. Claybon Jabs, MD

## 2013-08-13 NOTE — CV Procedure (Signed)
Intra-operative Transesophageal Echocardiography Report:  Mr. Avedis. 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 He subsequently underwent cardiac catheterization which revealed  severe three-vessel coronary disease with 80% LAD stenosis, 100% occlusion of both the right coronary artery and left circumflex coronary arteries. Both vessels  filled with collaterals. The left ventriculogram showed evidence of a previous inferior wall MI. He is now scheduled to undergo coronary artery bypass grafting by Dr. Dorris Fetch. Intraoperative transesophageal echocardiography was indicated to evaluate the right and left ventricular  function, to determine if any valvular pathology was present, and to serve as a monitor for intraoperative volume status and intracardiac air.  The patient was brought to the operating room at [facility] and general anesthesia was induced without difficulty. Following endotracheal intubation and orogastric suctioning, the transesophageal echocardiography probe was inserted into the esophagus without difficulty.  Impression: Pre-bypass findings:  1. Aortic valve: The aortic valve was trileaflet. The leaflets were thin and pliable and open normally and there was no aortic insufficiency.  2. Mitral valve: The mitral annulus was dilated and there was  apical displacement of the coaptation point. There was a central jet of mitral regurgitation which was graded as 1+. There were no prolapsing, flail, or billowing leaflets segments noted.  3. Left ventricle: Was moderate left ventricular dysfunction. 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 This area was akinetic and aneurysmal and thinned consistent with a previous myocardial infarction. There was moderate dilatation of the left ventricular cavity. Left ventricular end-diastolic diameter measured 5.1 cm at end  diastole at the mid-papillary level in the transgastric short axis view. There was moderate concentric left ventricular hypertrophy with left ventricular wall thickness measured 1.15-1.2 in the inferior and anterior walls. There was dyskinesis in the inferior basilar region there was mild hypokinesis noted in the remaining segments. The left ventricular ejection fraction was estimated at 45%. There was no thrombus noted in the left ventricular cavity or left ventricular apex.   3. Right ventricle: The right ventricular cavity  appeared normal in size. There was normal contractility of the right ventricular free wall.  5. Tricuspid valve: The tricuspid valve appeared structurally normal and there was trace tricuspid insufficiency.  6. Interatrial septum: The interatrial septum was intact without evidence of patent foramen ovale or atrial septal defect by color Doppler or bubble study.  7. Left atrium: There was no thrombus noted within the left atrial cavity or left atrial appendage.  8.Ascending aorta: There was a well-defined aortic root and sinotubular ridge. There was no aneurysmal dilatation of the aortic root or proximal ascending aorta. There was no effacement of the sinuses of Valsalva. However there was moderate calcification noted within the sinuses of Valsalva as well as  scattered calcification noted  within the walls of the proximal ascending aorta.  9. Descending aorta: There was severe atheromatous disease noted within the wall of the descending aorta. The plaques measured up to 0.65 cm in diameter and no mobile components were noted. There was no aneurysmal dilatation of the descending aorta. Descending aorta measured 2.2 cm in diameter.   Post bypass findings:  1. Aortic valve: The aortic valve appeared unchanged from the pre-bypass study. There was normal functioning of the valve and no aortic insufficiency.  2. Mitral valve: The mitral valve function appeared unchanged from the  pre-bypass study. There was 1+ mitral insufficiency with a central  jet.  3. 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 There was mild hypokinesis involving the remaining segments and the ejection fraction was estimated at 45-50%. There were no new regional  regional wall motion abnormalities noted. No intra-cardiac air was seen.  4. Right ventricle: The right ventricular cavity appeared unchanged from the pre-bypass study. There was good contractility of the right ventricular free wall.  5. Tricuspid valve: The tricuspid valve appeared structurally normal and there was trace tricuspid insufficiency.  Kipp Brood, M.D.

## 2013-08-13 NOTE — Anesthesia Postprocedure Evaluation (Signed)
  Anesthesia Post-op Note  Patient: Jerry Davenport  Procedure(s) Performed: Procedure(s) with comments: CORONARY ARTERY BYPASS GRAFTING (CABG) (N/A) - CABG x three,  using left internal mammary artery and right leg greater saphenous vein harvested endoscopically  Patient Location: PACU  Anesthesia Type:General  Level of Consciousness: sedated, unresponsive and Patient remains intubated per anesthesia plan  Airway and Oxygen Therapy: Patient remains intubated per anesthesia plan and Patient placed on Ventilator (see vital sign flow sheet for setting)  Post-op Pain: none  Post-op Assessment: Post-op Vital signs reviewed, Patient's Cardiovascular Status Stable and Respiratory Function Stable  Post-op Vital Signs: stable  Complications: No apparent anesthesia complications

## 2013-08-13 NOTE — Progress Notes (Signed)
Echocardiogram Echocardiogram Transesophageal has been performed.  Claribel Sachs 08/13/2013, 8:41 AM

## 2013-08-13 NOTE — H&P (View-Only) (Signed)
PCP is Park Pope, MD Referring Provider is Antonieta Iba, MD  Chief Complaint  Patient presents with  . Coronary Artery Disease    Surgical eval for severe 3 vessel disease, Cardiac Cath 07/19/13    HPI: 72 year old gentleman presents for evaluation of three-vessel coronary disease.  Jerry Davenport is a 72 year old gentleman who recently had a DOT physical and was found to have been initiated on. He went to Dr. Juanetta Gosling and as part of his evaluation had an EKG. 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 He was referred to Dr. Mariah Milling and a cardiac catheterization. That revealed severe three-vessel coronary disease with an 80% LAD stenosis and total occlusion of the right coronary and left circumflex, which filled via collaterals from the LAD. Left ventriculogram showed inferior akinesis.  Jerry Davenport is a poor historian. He says that he has occasional chest pain. He describes this as a sharp pain just to the left of the sternum that comes on unpredictably in the last up to 15 minutes. He does get short of breath with exertion and sometimes has to stop while walking into the grocery store. He smoked about a pack a day for 60 years but says he has been trying to cut back. He currently is smoking 4-5 cigarettes a day.     Past Medical History  Diagnosis Date  . Hyposmolality and/or hyponatremia   . Other and unspecified hyperlipidemia   . Headache(784.0)   . Backache, unspecified   . Allergic rhinitis, cause unspecified   . Peripheral vascular disease, unspecified   . Tobacco use disorder   . Other constipation   . Stroke   . Coronary artery disease     Past Surgical History  Procedure Laterality Date  . Hemorroidectomy    . Cardiac catheterization  2014    [facility]    Family History  Problem Relation Age of Onset  . Hypertension Mother     Social History History  Substance Use Topics  . Smoking status: Current Every Day Smoker -- 1.00 packs/day for 60 years    Types: Cigarettes  .  Smokeless tobacco: Never Used     Comment: down to 2-4 cigs a day now  . Alcohol Use: No    Current Outpatient Prescriptions  Medication Sig Dispense Refill  . acetaminophen (TYLENOL) 500 MG tablet Take 500 mg by mouth as needed for pain.      Marland Kitchen aspirin 81 MG tablet Take 81 mg by mouth daily.      . furosemide (LASIX) 20 MG tablet Take 10 mg by mouth daily.      Marland Kitchen HYDROcodone-acetaminophen (NORCO/VICODIN) 5-325 MG per tablet Take 1 tablet by mouth as needed for pain.      . Loratadine (CLARITIN) 10 MG CAPS Take 5 mg by mouth daily as needed.       . metoprolol tartrate (LOPRESSOR) 25 MG tablet Take 12.5 mg by mouth 2 (two) times daily.      . nitroGLYCERIN (NITROSTAT) 0.4 MG SL tablet Place 0.4 mg under the tongue every 5 (five) minutes as needed for chest pain.      . pravastatin (PRAVACHOL) 40 MG tablet Take 40 mg by mouth daily.       No current facility-administered medications for this visit.    No Known Allergies  Review of Systems  Constitutional: Positive for fatigue.  Respiratory: Positive for cough, shortness of breath and wheezing.   Cardiovascular: Positive for chest pain.       "blocked artery  right leg"  Genitourinary: Positive for hematuria.  Musculoskeletal:       Right hip pain  Neurological: Negative.   Hematological: Does not bruise/bleed easily.  All other systems reviewed and are negative.    BP 113/72  Pulse 71  Resp 16  Ht 5\' 9"  (1.753 m)  Wt 158 lb (71.668 kg)  BMI 23.32 kg/m2  SpO2 98% Physical Exam  Vitals reviewed. Constitutional: He is oriented to person, place, and time. He appears well-developed and well-nourished. No distress.  HENT:  Head: Normocephalic and atraumatic.  Eyes: Pupils are equal, round, and reactive to light.  Neck: Normal range of motion. Neck supple. No thyromegaly present.  No carotid bruits  Cardiovascular: Normal rate and regular rhythm.   Difficult to auscultate HS, no murmur noted  Pulmonary/Chest: Effort normal.  He has no wheezes. He has no rales.  Diminished BS bilaterally  Abdominal: Soft. There is no tenderness.  Musculoskeletal:  +clubbing, no cyanosis or edema  Lymphadenopathy:    He has no cervical adenopathy.  Neurological: He is alert and oriented to person, place, and time. No cranial nerve deficit.  No focal motor deficit  Skin: Skin is warm and dry.     Diagnostic Tests: Cardiac catheterization as previously noted  CBC    Component Value Date/Time   WBC 6.0 07/16/2013 0907   RBC 3.35* 07/16/2013 0907   HGB 13.0 07/16/2013 0907   HCT 36.4* 07/16/2013 0907   MCV 109* 07/16/2013 0907   MCH 38.8* 07/16/2013 0907   MCHC 35.7 07/16/2013 0907   RDW 15.8* 07/16/2013 0907   LYMPHSABS 1.4 07/16/2013 0907   EOSABS 0.3 07/16/2013 0907   BASOSABS 0.1 07/16/2013 0907    BMET    Component Value Date/Time   NA 140 07/16/2013 0907   K 4.8 07/16/2013 0907   CL 104 07/16/2013 0907   CO2 24 07/16/2013 0907   GLUCOSE 114* 07/16/2013 0907   BUN 15 07/16/2013 0907   CREATININE 0.93 07/16/2013 0907   CALCIUM 9.2 07/16/2013 0907   GFRNONAA 81 07/16/2013 0907   GFRAA 94 07/16/2013 0907     Impression: Jerry Davenport is a 72 year old gentleman who has a history of tobacco abuse and hypertension and who has severe three-vessel coronary disease with total occlusion of the circumflex and right coronary and 80% stenosis in his LAD. 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  Coronary bypass grafting is indicated for survival benefit. Relief of symptoms is a little harder to predict as he is a poor historian. I'm not sure that the chest pain he describes anginal in nature. There is a high likelihood however that his coronary disease is contributing to his shortness of breath.  I did counsel him on smoking cessation.  I discussed with the patient and his wife the general nature of the procedure, the need for general anesthesia, and the incisions to be used. I discussed the expected hospital stay,  overall recovery and short and long term outcomes. They understand the risks include, but are not limited to, death, stroke, MI, DVT/PE, bleeding, possible need for transfusion, infections, cardiac arrhythmias, and other organ system dysfunction including respiratory, renal, or GI complications. He accepts these risks and agrees to proceed.   We will attempt to harvest the vein from his left leg. I'm not sure the symptoms he describes with his left hip his claudication, so if necessary we will take vein from the right leg as well.  Plan: Coronary bypass grafting x4 on Monday, October 27  He does need pulmonary function tests preoperatively so we can assess his COPD.

## 2013-08-13 NOTE — Interval H&P Note (Signed)
History and Physical Interval Note: ABG showed hypoxemia, otherwise OK  08/13/2013 7:19 AM  Jerry Davenport  has presented today for surgery, with the diagnosis of CAD  The various methods of treatment have been discussed with the patient and family. After consideration of risks, benefits and other options for treatment, the patient has consented to  Procedure(s) with comments: CORONARY ARTERY BYPASS GRAFTING (CABG) (N/A) - CABG x   using left internal mammary artery and right leg greater saphenous vein harvested endoscopically as a surgical intervention .  The patient's history has been reviewed, patient examined, no change in status, stable for surgery.  I have reviewed the patient's chart and labs.  Questions were answered to the patient's satisfaction.     Raymundo Rout C

## 2013-08-13 NOTE — Transfer of Care (Signed)
Immediate Anesthesia Transfer of Care Note  Patient: Jerry Davenport  Procedure(s) Performed: Procedure(s) with comments: CORONARY ARTERY BYPASS GRAFTING (CABG) (N/A) - CABG x three,  using left internal mammary artery and right leg greater saphenous vein harvested endoscopically  Patient Location: PACU and SICU  Anesthesia Type:General  Level of Consciousness: Patient remains intubated per anesthesia plan  Airway & Oxygen Therapy: Patient remains intubated per anesthesia plan  Post-op Assessment: Report given to SICU RN  Post vital signs: Reviewed and stable  Complications: No apparent anesthesia complications

## 2013-08-13 NOTE — Progress Notes (Signed)
PM ROUNDS  Intubated  BP 105/59  Pulse 75  Temp(Src) 98.4 F (36.9 C) (Oral)  Resp 14  Ht 5\' 9"  (1.753 m)  Wt 156 lb 4.9 oz (70.9 kg)  BMI 23.07 kg/m2  SpO2 100%  CI= 2.5   Intake/Output Summary (Last 24 hours) at 08/13/13 1715 Last data filed at 08/13/13 1700  Gross per 24 hour  Intake 3888.52 ml  Output   3705 ml  Net 183.52 ml   Minimal CT output  Required increase in PEEP to 8 initially for hypoxemia, now down to 5- wean vent

## 2013-08-13 NOTE — OR Nursing (Signed)
11:55am - 1st call to SICU,  12:15 - call to vol. Desk to inform family off pump and closing , 12:10 - 2nd call to SICU.

## 2013-08-13 NOTE — Progress Notes (Signed)
Utilization Review Completed.Tatsuo Musial T10/27/2014  

## 2013-08-13 NOTE — Procedures (Signed)
Extubation Procedure Note  Patient Details:   Name: Jerry Davenport DOB: 1941-09-30 MRN: [id number]   Airway Documentation:  Pt extubated per Rapid Wean Protocol. Pre extubation: VC 1.1, NIF -25, positive cuff leak, ABG   PH 7.35, CO2 40.6, PaO2 75. Post Extubation: Pt is oriented and able to verbalize name/location. No stridor noted. Pt placed on 4L [place] Sats 95%. Pt resting at this time. RT will continue to monitor.  Evaluation  O2 sats: stable throughout Complications: No apparent complications Patient did tolerate procedure well. Bilateral Breath Sounds: Rhonchi Suctioning: Airway Yes  Elmer Picker 08/13/2013, 10:42 PM

## 2013-08-13 NOTE — Brief Op Note (Addendum)
08/13/2013  10:56 AM  PATIENT:  Jerry Davenport  72 y.o. male  PRE-OPERATIVE DIAGNOSIS:  CAD  POST-OPERATIVE DIAGNOSIS:  CAD  PROCEDURE:   CORONARY ARTERY BYPASS GRAFTING x 3 (LIMA-LAD, SVG-OM2, SVG-PD) ENDOSCOPIC VEIN HARVEST LEFT THIGH  SURGEON:  Loreli Slot, MD  ASSISTANT: Coral Ceo, PA-C  ANESTHESIA:   general  PATIENT CONDITION:  ICU - intubated and hemodynamically stable.  PRE-OPERATIVE WEIGHT: 70 kg  OM2 poor target, OM1 ungraftable LAD and PDA fair targets

## 2013-08-14 ENCOUNTER — Encounter (HOSPITAL_COMMUNITY): Payer: Self-pay | Admitting: Thoracic Surgery (Cardiothoracic Vascular Surgery)

## 2013-08-14 ENCOUNTER — Inpatient Hospital Stay (HOSPITAL_COMMUNITY): Payer: Medicare Other

## 2013-08-14 LAB — GLUCOSE, CAPILLARY
Glucose-Capillary: 113 mg/dL — ABNORMAL HIGH (ref 70–99)
Glucose-Capillary: 117 mg/dL — ABNORMAL HIGH (ref 70–99)
Glucose-Capillary: 122 mg/dL — ABNORMAL HIGH (ref 70–99)
Glucose-Capillary: 104 mg/dL — ABNORMAL HIGH (ref 70–99)
Glucose-Capillary: 108 mg/dL — ABNORMAL HIGH (ref 70–99)
Glucose-Capillary: 117 mg/dL — ABNORMAL HIGH (ref 70–99)
Glucose-Capillary: 120 mg/dL — ABNORMAL HIGH (ref 70–99)
Glucose-Capillary: 94 mg/dL (ref 70–99)
Glucose-Capillary: 97 mg/dL (ref 70–99)

## 2013-08-14 LAB — BASIC METABOLIC PANEL
CO2: 24 mEq/L (ref 19–32)
Calcium: 7.8 mg/dL — ABNORMAL LOW (ref 8.4–10.5)
Chloride: 105 mEq/L (ref 96–112)
Creatinine, Ser: 0.78 mg/dL (ref 0.50–1.35)
GFR calc non Af Amer: 87 mL/min — ABNORMAL LOW (ref >90)
Sodium: 134 mEq/L — ABNORMAL LOW (ref 135–145)

## 2013-08-14 LAB — CBC
HCT: 24.3 % — ABNORMAL LOW (ref 39.0–52.0)
Hemoglobin: 8.7 g/dL — ABNORMAL LOW (ref 13.0–17.0)
MCH: 39.9 pg — ABNORMAL HIGH (ref 26.0–34.0)
MCH: 37.2 pg — ABNORMAL HIGH (ref 26.0–34.0)
MCHC: 36.2 g/dL — ABNORMAL HIGH (ref 30.0–36.0)
MCV: 110.1 fL — ABNORMAL HIGH (ref 78.0–100.0)
MCV: 103.8 fL — ABNORMAL HIGH (ref 78.0–100.0)
Platelets: 79 10*3/uL — ABNORMAL LOW (ref 150–400)
Platelets: 80 10*3/uL — ABNORMAL LOW (ref 150–400)
RBC: 1.88 MIL/uL — ABNORMAL LOW (ref 4.22–5.81)
RDW: 13.8 % (ref 11.5–15.5)
RDW: 18.7 % — ABNORMAL HIGH (ref 11.5–15.5)
WBC: 5.3 10*3/uL (ref 4.0–10.5)
WBC: 6.3 10*3/uL (ref 4.0–10.5)

## 2013-08-14 LAB — POCT I-STAT 3, ART BLOOD GAS (G3+)
Bicarbonate: 25.2 mEq/L — ABNORMAL HIGH (ref 20.0–24.0)
Bicarbonate: 22.7 mEq/L (ref 20.0–24.0)
O2 Saturation: 90 %
Patient temperature: 37.4
Patient temperature: 37.3
pCO2 arterial: 40.8 mmHg (ref 35.0–45.0)
pH, Arterial: 7.37 (ref 7.350–7.450)
pO2, Arterial: 80 mmHg (ref 80.0–100.0)
pO2, Arterial: 61 mmHg — ABNORMAL LOW (ref 80.0–100.0)

## 2013-08-14 LAB — PREPARE PLATELET PHERESIS: Unit division: 0

## 2013-08-14 LAB — CREATININE, SERUM: GFR calc Af Amer: >90 mL/min (ref >90)

## 2013-08-14 LAB — POCT I-STAT, CHEM 8
Calcium, Ion: 1.21 mmol/L (ref 1.13–1.30)
Chloride: 102 mEq/L (ref 96–112)
Glucose, Bld: 126 mg/dL — ABNORMAL HIGH (ref 70–99)
HCT: 26 % — ABNORMAL LOW (ref 39.0–52.0)
Potassium: 4.2 mEq/L (ref 3.5–5.1)

## 2013-08-14 LAB — MAGNESIUM: Magnesium: 2.4 mg/dL (ref 1.5–2.5)

## 2013-08-14 LAB — PREPARE RBC (CROSSMATCH)

## 2013-08-14 MED ORDER — INSULIN ASPART 100 UNIT/ML ~~LOC~~ SOLN
0.0000 [IU] | SUBCUTANEOUS | Status: DC
  Administered 2013-08-14: 2 [IU] via SUBCUTANEOUS

## 2013-08-14 MED ORDER — FUROSEMIDE 10 MG/ML IJ SOLN
40.0000 mg | Freq: Once | INTRAMUSCULAR | Status: AC
  Administered 2013-08-14: 40 mg via INTRAVENOUS
  Filled 2013-08-14: qty 4

## 2013-08-14 MED ORDER — INSULIN DETEMIR 100 UNIT/ML ~~LOC~~ SOLN
25.0000 [IU] | Freq: Once | SUBCUTANEOUS | Status: AC
  Filled 2013-08-14 (×2): qty 0.25

## 2013-08-14 MED ORDER — INSULIN DETEMIR 100 UNIT/ML ~~LOC~~ SOLN
25.0000 [IU] | Freq: Every day | SUBCUTANEOUS | Status: DC
  Administered 2013-08-14: 25 [IU] via SUBCUTANEOUS
  Filled 2013-08-14 (×2): qty 0.25

## 2013-08-14 MED ORDER — GUAIFENESIN ER 600 MG PO TB12
1200.0000 mg | ORAL_TABLET | Freq: Two times a day (BID) | ORAL | Status: AC
  Administered 2013-08-14 – 2013-08-18 (×10): 1200 mg via ORAL
  Filled 2013-08-14 (×10): qty 2

## 2013-08-14 MED ORDER — POTASSIUM CHLORIDE 10 MEQ/50ML IV SOLN
10.0000 meq | INTRAVENOUS | Status: AC
  Administered 2013-08-14 (×3): 10 meq via INTRAVENOUS
  Filled 2013-08-14 (×3): qty 50

## 2013-08-14 NOTE — Progress Notes (Signed)
ANESTHESIA Follow-up  NOTE  Subjective: awake and alert sitting in chair, in good spirits, neuro intact, having incisional soreness with deep breathing.   Vital Signs:   08/14/13 1900  BP: 115/72  Pulse: 75 (SR)  Temp: 36.7  Resp: 24      Labs: Lab Results  Component Value Date   WBC 6.3 08/14/2013   HGB 8.8* 08/14/2013   HCT 26.0* 08/14/2013   MCV 103.8* 08/14/2013   PLT 80* 08/14/2013   Lab Results  Component Value Date   NA 139 08/14/2013   K 4.2 08/14/2013   CL 102 08/14/2013   CO2 24 08/14/2013   BUN 15 08/14/2013   CREATININE 1.10 08/14/2013   GLUCOSE 126* 08/14/2013   ABG    Component Value Date/Time   PHART 7.370 08/14/2013 0423   PCO2ART 39.3 08/14/2013 0423   PO2ART 61.0* 08/14/2013 0423   HCO3 22.7 08/14/2013 0423   TCO2 22 08/14/2013 1626   ACIDBASEDEF 2.0 08/14/2013 0423   O2SAT 90.0 08/14/2013 0423   Extubated 9 1/2 hours post-op.  72 y.o. male one day S/P CABG X 3  Now doing well with uneventtful post-op course   Kipp Brood, MD               Melonie Florida, MD 08/14/2013 7:47 PM

## 2013-08-14 NOTE — Care Management Note (Addendum)
    Page 1 of 2   08/20/2013     11:18:20 AM   CARE MANAGEMENT NOTE 08/20/2013  Patient:  Jerry Davenport,Jerry Davenport   Account Number:  [id number]  Date Initiated:  08/14/2013  Documentation initiated by:  MAYO,HENRIETTA  Subjective/Objective Assessment:   adm s/p CABG; lives with ex-wife, independent PTA     Action/Plan:   Anticipated DC Date:  08/19/2013   Anticipated DC Plan:  HOME W HOME HEALTH SERVICES      DC Planning Services  CM consult      [facility] Choice  HOME HEALTH   Choice offered to / List presented to:  C-1 Patient   DME arranged  [facility]      DME agency  [facility]     [facility] arranged  HH-1 RN      [facility] agency  [facility]   Status of service:  Completed, signed off Medicare Important Message given?   (If response is "NO", the following Medicare IM given date fields will be blank) Date Medicare IM given:   Date Additional Medicare IM given:    Discharge Disposition:  HOME W HOME HEALTH SERVICES  Per UR Regulation:  Reviewed for med. necessity/level of care/duration of stay  If discussed at Long Length of Stay Meetings, dates discussed:    Comments:  08/20/13 Waunetta Riggle,RN,BSN [phone] PT FOR DC HOME TODAY.  WILL NEED HH FOLLOW UP, RW AND HOME O2, AS HAS DESATURATED ON RA TO 84% LAST PM AT REST. REFERRAL TO [facility] FOR DME NEEDS.  REFERRAL TO [facility] FOR HH FOLLOW UP, PER PT CHOICE.  START OF CARE 24-48H POST DC DATE.  08/17/13 Reid Regas,RN,BSN [phone] NOTED ORDER FOR HOME O2, HOWEVER, AT THIS POINT, PT DOES NOT QUALIFY FOR HOME OXYGEN.  PT MAINTAINED ROOM AIR SATS TODAY ABOVE 93% WHILE AMBULATING AROUND UNIT.  NURSE STATES SHE WILL KEEP ON RA TODAY AND DO SPOT CHECKS.  WILL FOLLOW UP.  08/14/13 1138 Henrietta Mayo RN MSN BSN CCM Discussed discharge options with pt and ex-wife.  Pt was working PTA and very active, ex-wife will provide 24/7 assistance when pt discharged.

## 2013-08-14 NOTE — Op Note (Signed)
Jerry, Davenport NO.:  [id number]  MEDICAL RECORD NO.:  [id number]  LOCATION:  2S07C                        FACILITY:  [facility]  PHYSICIAN:  Jerry Davenport, M.D.DATE OF BIRTH:  10/09/1941  DATE OF PROCEDURE:  08/13/2013 DATE OF DISCHARGE:                              OPERATIVE REPORT   PREOPERATIVE DIAGNOSIS:  Three-vessel coronary artery disease.  POSTOPERATIVE DIAGNOSIS:  Three-vessel coronary artery disease.  PROCEDURES:  Median sternotomy, extracorporeal circulation, coronary artery bypass grafting x3 (left internal mammary artery to the left anterior descending, saphenous vein graft to obtuse marginal 2, saphenous vein graft to posterior descending), endoscopic vein harvest left thigh.  SURGEON:  Jerry Decent. Dorris Fetch, MD  ASSISTANTS:  Jerry Ceo, PA  ANESTHESIA:  General.  FINDINGS:  Large bleb in left upper lobe.  LAD and posterior descending fair quality targets.  OM-2 poor quality target.  OM-1 too small to graft.  CLINICAL NOTE:  Jerry Davenport is a 72 year old gentleman who has a history of heavy tobacco abuse and COPD.  He also has occasional chest pain as well as shortness of breath with exertion.  He recently had a physical where an EKG showed an inferior MI.  He was referred to Jerry Davenport, and underwent cardiac catheterization which revealed severe three-vessel coronary artery disease with an 80% LAD stenosis and total occlusion of the right coronary and left circumflex.  He was referred for coronary artery bypass grafting.  The indications, risks, benefits, and alternatives were discussed in detail with the patient.  He understood the risks, accepted them, and agreed to proceed.  OPERATIVE NOTE:  Jerry Davenport was brought to the preoperative holding area on August 13, 2013, there Anesthesia placed a Swan-Ganz catheter and an arterial blood pressure monitoring line.  Intravenous antibiotics were administered.  The patient was taken  to the operating room, anesthetized, and intubated.  A Foley catheter was placed. Transesophageal echocardiography was performed.  This showed an area of basal inferior akinesis.  The remainder of the left ventricular function was well preserved.  There was no significant valvular pathology.  The chest, abdomen, and legs were prepped and draped in usual sterile fashion.  Median sternotomy was performed and the left internal mammary artery was harvested using standard technique.  Simultaneously, an incision made in the medial aspect of the left leg at the level of the knee.  The greater saphenous vein was harvested from just below the knee to the groin.  It was a good quality conduit.  2000 units of Heparin  was administered during the vessel harvest.  Remainder of the full heparin dose was given prior to opening the pericardium.  After harvesting the conduits, the pericardium was opened.  After confirming adequate anticoagulation with the ACT measurement, the aorta was cannulated via concentric 2-0 Ethibond pledgeted pursestring sutures.  A dual-stage venous cannula was placed via a pursestring suture in the right atrial appendage.  Cardiopulmonary bypass was instituted and the patient was cooled to 32 degrees Celsius.  The coronary arteries were inspected and anastomotic sites were chosen.  Of note, OM-2 was noted to be very small vessel.  OM-1 was far too small to graft.  The posterior descending and LAD were good quality targets at the sites of anastomosis. The LAD was grafted more distally than planned due to plaque just proximal to the anastomosis, a probe did however pass this plaque after the vessel was opened.  The conduits were inspected and cut to length. A foam pad was placed in the pericardium to insulate the heart and protect the left phrenic nerve.  A temperature probe was placed in myocardial septum and a cardioplegia cannula was placed in the ascending aorta.  The  aorta was crossclamped.  The left ventricle was emptied via the aortic root vent.  Cardiac arrest then was achieved with a combination of cold antegrade blood cardioplegia and topical iced saline.  1.2 L of cardioplegia was administered.  There was a good diastolic arrest and septal cooling to 9 degrees Celsius.  The following distal anastomoses were performed.  First, a reversed saphenous vein graft was placed end-to-side to the posterior descending branch of the right coronary, this was a 1.5-mm target vessel, a probe passed easily distally.  The vein was of good quality, was anastomosed end-to-side with a running 7-0 Prolene suture. The probe passed easily proximally and distally.  At the completion of anastomosis, cardioplegia was administered through the graft, there was good flow and good hemostasis.  Next, a reversed saphenous vein graft was placed end-to-side to the obtuse marginal #2, this was difficult due to the vessel being very Small. It only accepted a 1-mm probe.  It was a poor quality target.  The vein was anastomosed end-to-side with a running 7-0 Prolene suture.  A 1- mm probe did pass easily proximally and distally through the anastomosis.  With cardioplegia administration, there was satisfactory flow and good hemostasis.  Additional cardioplegia was administered down the aortic root.  Next, the pericardium was incised to allow passage of the left mammary artery. The distal end of the LIMA was beveled.  It was then anastomosed end-to-side to the distal LAD.  Both of these vessels were 1.5 mm thin-walled vessels, both were good quality.  The end-to-side anastomosis was performed with a running 8-0 Prolene suture.  At the completion of the mammary to LAD anastomosis, the bulldog clamp was briefly removed.  Septal rewarming was noted.  The bulldog clamp was replaced and the mammary pedicle was tacked to the epicardial surface of the heart with 6-0 Prolene  sutures.  The cardioplegia cannula was removed from the ascending aorta.  The proximal vein graft anastomoses then were performed with 4.5-mm punch aortotomies with running 6-0 Prolene sutures.  At the completion of the final proximal anastomosis, lidocaine was administered.  The patient was placed in Trendelenburg position.  The bulldog clamp was removed from the left mammary artery.  The aortic root was de-aired and the aortic crossclamp was removed.  Total crossclamp time was 76 minutes.  The patient initially fibrillated, but spontaneously converted to a heart block and did not require defibrillation.  While rewarming was completed, all proximal and distal anastomoses were inspected for hemostasis.  Epicardial pacing wires were placed on the right ventricle and right atrium.  DDD pacing was initiated at 90 beats per minute.  A dopamine infusion was started at 3 mcg/kg/minute.  When the patient had rewarmed to 37 degrees Celsius, he was weaned from cardiopulmonary bypass on the first attempt without difficulty.  Total bypass time was 125 minutes.  The initial cardiac index was greater than 2 liters/minute/meter squared, and the patient remained hemodynamically stable throughout postbypass.  Postbypass transesophageal echocardiography initially showed septal dyskinesis with pacing. Later, after the patient converted to sinus rhythm and there was improvement, and the wall motion was unchanged from the prebypass study.  A test dose of protamine was administered and was well tolerated.  The atrial and aortic cannulae were removed.  The remainder of the protamine was administered without incident.  The chest was irrigated with warm saline.  Hemostasis was achieved.  Chest tubes were placed into the left pleura and mediastinum.  The pericardium was not closed.  The sternum was closed with interrupted heavy gauge stainless steel wires.  The pectoralis fascia, subcutaneous tissue, and  skin were closed in standard fashion.  All sponge, needle, and instrument counts were correct at the end of the procedure.  The patient was taken from the operating room to the surgical intensive care unit in good condition.     Jerry Decent Dorris Davenport, M.D.     SCH/MEDQ  D:  08/13/2013  T:  08/14/2013  Job:  [id number]

## 2013-08-14 NOTE — Progress Notes (Addendum)
TCTS DAILY ICU PROGRESS NOTE                   [address]            [place] [address]          [phone]   1 Day Post-Op Procedure(s) (LRB): CORONARY ARTERY BYPASS GRAFTING (CABG) (N/A)  Total Length of Stay:  LOS: 1 day   Subjective: Feels "rough" this am.  Sore at chest tube sites, some nausea overnight.   Objective: Vital signs in last 24 hours: Temp:  [97 F (36.1 C)-99.7 F (37.6 C)] 98.8 F (37.1 C) (10/28 0730) Pulse Rate:  [69-92] 90 (10/28 0730) Cardiac Rhythm:  [-] Atrial paced (10/28 0700) Resp:  [11-27] 19 (10/28 0730) BP: (88-131)/(50-63) 111/52 mmHg (10/28 0700) SpO2:  [91 %-100 %] 95 % (10/28 0730) FiO2 (%):  [40 %-60 %] 40 % (10/27 2215) Weight:  [156 lb 4.9 oz (70.9 kg)-166 lb 14.2 oz (75.7 kg)] 166 lb 14.2 oz (75.7 kg) (10/28 0500)  Filed Weights   08/12/13 1500 08/13/13 1305 08/14/13 0500  Weight: 156 lb 8 oz (70.988 kg) 156 lb 4.9 oz (70.9 kg) 166 lb 14.2 oz (75.7 kg)  PRE-OPERATIVE WEIGHT: 70 kg   Weight change: -3.1 oz (-0.088 kg)   Hemodynamic parameters for last 24 hours: PAP: (20-42)/(4-24) 34/20 mmHg CO:  [4.3 L/min-7.3 L/min] 6.1 L/min CI:  [2.3 L/min/m2-3.9 L/min/m2] 3.3 L/min/m2  Intake/Output from previous day: 10/27 0701 - 10/28 0700 In: 5778.1 [I.V.:3448.1; Blood:650; NG/GT:30; IV Piggyback:1650] Out: 5245 [Urine:3665; Blood:1050; Chest Tube:530]  CBGs 116-123-141-145-129-104-94-113-118-117-112-155-122    Current Meds: Scheduled Meds: . acetaminophen  1,000 mg Oral Q6H   Or  . acetaminophen (TYLENOL) oral liquid 160 mg/5 mL  1,000 mg Per Tube Q6H  . aspirin EC  325 mg Oral Daily   Or  . aspirin  324 mg Per Tube Daily  . bisacodyl  10 mg Oral Daily   Or  . bisacodyl  10 mg Rectal Daily  . cefUROXime (ZINACEF)  IV  1.5 g Intravenous Q12H  . docusate sodium  200 mg Oral Daily  . insulin regular  0-10 Units Intravenous TID WC  . levalbuterol  0.63 mg Nebulization Q6H  . metoCLOPramide (REGLAN)  injection  10 mg Intravenous Q6H  . metoprolol tartrate  12.5 mg Oral BID   Or  . metoprolol tartrate  12.5 mg Per Tube BID  . [START ON 08/15/2013] pantoprazole  40 mg Oral Daily  . simvastatin  20 mg Oral q1800  . sodium chloride  3 mL Intravenous Q12H   Continuous Infusions: . sodium chloride 20 mL/hr at 08/13/13 1400  . sodium chloride 20 mL/hr at 08/13/13 1805  . sodium chloride    . dexmedetomidine Stopped (08/13/13 1800)  . DOPamine 3 mcg/kg/min (08/13/13 2300)  . insulin (NOVOLIN-R) infusion 1.9 Units/hr (08/14/13 0500)  . lactated ringers 20 mL/hr at 08/13/13 1818  . nitroGLYCERIN Stopped (08/13/13 1400)  . phenylephrine (NEO-SYNEPHRINE) Adult infusion Stopped (08/14/13 0330)   PRN Meds:.metoprolol, morphine injection, ondansetron (ZOFRAN) IV, oxyCODONE, sodium chloride    Physical Exam: General appearance: alert, cooperative and no distress Heart: regular rate and rhythm Lungs: Coarse bilateral BS Extremities: No edema Wound: Dressed and dry    Lab Results: CBC: Recent Labs  08/13/13 1845 08/13/13 1849 08/14/13 0425  WBC 7.4  --  5.3  HGB 8.5* 7.5* 7.5*  HCT 23.1* 22.0* 20.7*  PLT 104*  --  79*  BMET:  Recent Labs  08/13/13 1849 08/14/13 0425  NA 139 134*  K 4.6 3.9  CL 105 105  CO2  --  24  GLUCOSE 146* 148*  BUN 10 12  CREATININE 0.90 0.78  CALCIUM  --  7.8*    PT/INR:  Recent Labs  08/13/13 1308  LABPROT 18.0*  INR 1.53*   Radiology: Dg Chest Portable 1 View  08/13/2013   CLINICAL DATA:  Coronary artery disease.  EXAM: PORTABLE CHEST - 1 VIEW  COMPARISON:  08/09/2013  FINDINGS: Endotracheal tube, NG tube, chest tubes, and Swan-Ganz catheter appear in good position. New CABG. Slight bilateral pulmonary edema. Small left effusion and left base atelectasis. Very tiny left apical medial pneumothorax.  IMPRESSION: Slight bilateral pulmonary edema. Left base atelectasis and small effusion. Very tiny left apical pneumothorax.   Electronically  Signed   By: Geanie Cooley M.D.   On: 08/13/2013 13:39     Assessment/Plan: S/P Procedure(s) (LRB): CORONARY ARTERY BYPASS GRAFTING (CABG) (N/A)  CV- SR, 85 under pacer.  Hopefully can d/c pacer this am.  On low dose Dopamine, SBPs around 100-110- will wean and d/c as able.  Endo- CBGs generally stable.  Will wean insulin gtt. No h/o DM (A1C=5.5).  Expected postop blood loss anemia- watch H/H.  Pulm- aggressive pulm toilet, IS.   Mobilize, d/c lines and tubes, routine POD#1 progression.    COLLINS,GINA H 08/14/2013 7:43 AM  Patient seen and examined, agree with above Will need aggressive pulmonary hygiene Dc CT, swan Symptomatic anemia- will transfuse

## 2013-08-14 NOTE — Clinical Documentation Improvement (Signed)
THIS DOCUMENT IS NOT A PERMANENT PART OF THE MEDICAL RECORD  Please update your documentation with the medical record to reflect your response to this query. If you need help knowing how to do this please call [phone].  08/14/13   Dear Dr. Dorris Fetch / Associates,  In a better effort to capture your patient's severity of illness, reflect appropriate length of stay and utilization of resources, a review of the patient medical record has revealed the following indicators.    Based on your clinical judgment, please clarify and document in a progress note and/or discharge summary the clinical condition associated with the following supporting information:  In responding to this query please exercise your independent judgment.  The fact that a query is asked, does not imply that any particular answer is desired or expected.   Possible Clinical Conditions?  "      Atelectasis   "      Pleural effusion  "      Other Condition  "      Cannot Clinically Determine     Diagnostics: CXR: 10/27:  Left base atelectasis and small effusion, very tiny left apical pneumothorax. 10/28:  Increased basilar atelectasis, stable interstitial edema.    You may use possible, probable, or suspect with inpatient documentation. possible, probable, suspected diagnoses MUST be documented at the time of discharge  Reviewed: additional documentation in the medical record  Thank You,  Marciano Sequin,  Clinical Documentation Specialist: [phone] Health Information Management [place]

## 2013-08-14 NOTE — Progress Notes (Signed)
Patient ID: Jerry Davenport, male   DOB: August 31, 1941, 72 y.o.   MRN: [id number]   SICU Evening Rounds:   Hemodynamically stable   Urine output good    CBC    Component Value Date/Time   WBC 6.3 08/14/2013 1600   WBC 6.0 07/16/2013 0907   RBC 2.34* 08/14/2013 1600   RBC 3.35* 07/16/2013 0907   HGB 8.8* 08/14/2013 1626   HCT 26.0* 08/14/2013 1626   PLT 80* 08/14/2013 1600   MCV 103.8* 08/14/2013 1600   MCH 37.2* 08/14/2013 1600   MCH 38.8* 07/16/2013 0907   MCHC 35.8 08/14/2013 1600   MCHC 35.7 07/16/2013 0907   RDW 18.7* 08/14/2013 1600   RDW 15.8* 07/16/2013 0907   LYMPHSABS 1.4 07/16/2013 0907   EOSABS 0.3 07/16/2013 0907   BASOSABS 0.1 07/16/2013 0907     BMET    Component Value Date/Time   NA 139 08/14/2013 1626   NA 140 07/16/2013 0907   K 4.2 08/14/2013 1626   CL 102 08/14/2013 1626   CO2 24 08/14/2013 0425   GLUCOSE 126* 08/14/2013 1626   GLUCOSE 114* 07/16/2013 0907   BUN 15 08/14/2013 1626   BUN 15 07/16/2013 0907   CREATININE 1.10 08/14/2013 1626   CALCIUM 7.8* 08/14/2013 0425   GFRNONAA 82* 08/14/2013 1600   GFRAA >90 08/14/2013 1600     A/P:  Stable day. Continue current plans

## 2013-08-15 ENCOUNTER — Inpatient Hospital Stay (HOSPITAL_COMMUNITY): Payer: Medicare Other

## 2013-08-15 ENCOUNTER — Other Ambulatory Visit: Payer: Self-pay

## 2013-08-15 LAB — CBC
HCT: 22.8 % — ABNORMAL LOW (ref 39.0–52.0)
MCV: 106 fL — ABNORMAL HIGH (ref 78.0–100.0)
Platelets: 74 10*3/uL — ABNORMAL LOW (ref 150–400)
RBC: 2.15 MIL/uL — ABNORMAL LOW (ref 4.22–5.81)
RDW: 18.8 % — ABNORMAL HIGH (ref 11.5–15.5)
WBC: 5.1 10*3/uL (ref 4.0–10.5)

## 2013-08-15 LAB — TYPE AND SCREEN
ABO/RH(D): A POS
Antibody Screen: NEGATIVE

## 2013-08-15 LAB — BASIC METABOLIC PANEL
CO2: 25 mEq/L (ref 19–32)
Chloride: 101 mEq/L (ref 96–112)
Creatinine, Ser: 0.87 mg/dL (ref 0.50–1.35)
GFR calc non Af Amer: 84 mL/min — ABNORMAL LOW (ref >90)

## 2013-08-15 LAB — GLUCOSE, CAPILLARY
Glucose-Capillary: 106 mg/dL — ABNORMAL HIGH (ref 70–99)
Glucose-Capillary: 107 mg/dL — ABNORMAL HIGH (ref 70–99)
Glucose-Capillary: 114 mg/dL — ABNORMAL HIGH (ref 70–99)
Glucose-Capillary: 128 mg/dL — ABNORMAL HIGH (ref 70–99)
Glucose-Capillary: 96 mg/dL (ref 70–99)

## 2013-08-15 MED ORDER — ALUM & MAG HYDROXIDE-SIMETH 200-200-20 MG/5ML PO SUSP: 15.0000 mL | ORAL | Status: DC | PRN

## 2013-08-15 MED ORDER — FUROSEMIDE 40 MG PO TABS
40.0000 mg | ORAL_TABLET | Freq: Every day | ORAL | Status: DC
  Administered 2013-08-15 – 2013-08-20 (×6): 40 mg via ORAL
  Filled 2013-08-15 (×6): qty 1

## 2013-08-15 MED ORDER — MAGNESIUM HYDROXIDE 400 MG/5ML PO SUSP: 30.0000 mL | Freq: Every day | ORAL | Status: DC | PRN

## 2013-08-15 MED ORDER — INSULIN ASPART 100 UNIT/ML ~~LOC~~ SOLN
0.0000 [IU] | Freq: Three times a day (TID) | SUBCUTANEOUS | Status: DC
  Administered 2013-08-16: 2 [IU] via SUBCUTANEOUS

## 2013-08-15 MED ORDER — AMIODARONE LOAD VIA INFUSION
150.0000 mg | Freq: Once | INTRAVENOUS | Status: AC
  Administered 2013-08-15: 150 mg via INTRAVENOUS
  Filled 2013-08-15: qty 83.34

## 2013-08-15 MED ORDER — AMIODARONE HCL IN DEXTROSE 360-4.14 MG/200ML-% IV SOLN
30.0000 mg/h | INTRAVENOUS | Status: DC
  Filled 2013-08-15 (×3): qty 200

## 2013-08-15 MED ORDER — SODIUM CHLORIDE 0.9 % IJ SOLN: 3.0000 mL | INTRAMUSCULAR | Status: DC | PRN

## 2013-08-15 MED ORDER — POTASSIUM CHLORIDE CRYS ER 20 MEQ PO TBCR
20.0000 meq | EXTENDED_RELEASE_TABLET | Freq: Two times a day (BID) | ORAL | Status: DC
  Administered 2013-08-15 – 2013-08-20 (×10): 20 meq via ORAL
  Filled 2013-08-15 (×13): qty 1

## 2013-08-15 MED ORDER — AMIODARONE HCL IN DEXTROSE 360-4.14 MG/200ML-% IV SOLN
60.0000 mg/h | INTRAVENOUS | Status: AC
  Administered 2013-08-15 – 2013-08-16 (×2): 60 mg/h via INTRAVENOUS
  Filled 2013-08-15 (×2): qty 200

## 2013-08-15 MED ORDER — SODIUM CHLORIDE 0.9 % IJ SOLN
3.0000 mL | Freq: Two times a day (BID) | INTRAMUSCULAR | Status: DC
  Administered 2013-08-15 – 2013-08-20 (×7): 3 mL via INTRAVENOUS

## 2013-08-15 MED ORDER — ALPRAZOLAM 0.25 MG PO TABS: 0.2500 mg | ORAL_TABLET | Freq: Four times a day (QID) | ORAL | Status: DC | PRN

## 2013-08-15 MED ORDER — MOVING RIGHT ALONG BOOK
Freq: Once | Status: AC
  Administered 2013-08-15: 12:00:00
  Filled 2013-08-15: qty 1

## 2013-08-15 MED ORDER — SODIUM CHLORIDE 0.9 % IV SOLN: 250.0000 mL | INTRAVENOUS | Status: DC | PRN

## 2013-08-15 NOTE — Progress Notes (Signed)
Received pt from [facility]. Pt is stable and resting in bed. Pt ambulated approx. 150 feet on way over here with rolling walker and 4L [place].

## 2013-08-15 NOTE — Progress Notes (Signed)
CARDIAC REHAB PHASE I   PRE:  Rate/Rhythm: 78 SR    BP: sitting 130/70    SaO2: 93 3L  MODE:  Ambulation: 350 ft   POST:  Rate/Rhythm: 83 SR    BP: sitting 120/70     SaO2: 93 4L Pt moving well but fatigues easily. DOE, SaO2 low normal on 4L. Very tired after long walk, return to bed. Used RW and 4L O2. Will f/u. Very congested, no production attempt. [id number]   Jerry Davenport CES, ACSM 08/15/2013 2:44 PM

## 2013-08-15 NOTE — Progress Notes (Addendum)
TCTS DAILY ICU PROGRESS NOTE                   [address]            [place] [address]          [phone]   2 Days Post-Op Procedure(s) (LRB): CORONARY ARTERY BYPASS GRAFTING (CABG) (N/A)  Total Length of Stay:  LOS: 2 days   Subjective: OOB in chair.  + cough, some thick sputum.  Not eating much. Nausea resolved.   Objective: Vital signs in last 24 hours: Temp:  [98 F (36.7 C)-98.6 F (37 C)] 98 F (36.7 C) (10/29 0749) Pulse Rate:  [31-279] 77 (10/29 0600) Cardiac Rhythm:  [-] Normal sinus rhythm (10/29 0600) Resp:  [15-31] 19 (10/29 0600) BP: (108-139)/(48-73) 128/73 mmHg (10/29 0600) SpO2:  [88 %-99 %] 90 % (10/29 0600) Weight:  [165 lb 2 oz (74.9 kg)] 165 lb 2 oz (74.9 kg) (10/29 0500)  Filed Weights   08/13/13 1305 08/14/13 0500 08/15/13 0500  Weight: 156 lb 4.9 oz (70.9 kg) 166 lb 14.2 oz (75.7 kg) 165 lb 2 oz (74.9 kg)  PRE-OPERATIVE WEIGHT: 70 kg   Weight change: 8 lb 13.1 oz (4 kg)   Hemodynamic parameters for last 24 hours: PAP: (34)/(17) 34/17 mmHg  Intake/Output from previous day: 10/28 0701 - 10/29 0700 In: 1198.1 [P.O.:60; I.V.:550.6; Blood:337.5; IV Piggyback:250] Out: 2225 [Urine:2085; Chest Tube:140]  CBGs  108-119-102-107-106-111-92    Current Meds: Scheduled Meds: . acetaminophen  1,000 mg Oral Q6H   Or  . acetaminophen (TYLENOL) oral liquid 160 mg/5 mL  1,000 mg Per Tube Q6H  . aspirin EC  325 mg Oral Daily   Or  . aspirin  324 mg Per Tube Daily  . bisacodyl  10 mg Oral Daily   Or  . bisacodyl  10 mg Rectal Daily  . docusate sodium  200 mg Oral Daily  . guaiFENesin  1,200 mg Oral BID  . insulin aspart  0-24 Units Subcutaneous Q4H  . insulin detemir  25 Units Subcutaneous Daily  . insulin regular  0-10 Units Intravenous TID WC  . levalbuterol  0.63 mg Nebulization Q6H  . metoprolol tartrate  12.5 mg Oral BID   Or  . metoprolol tartrate  12.5 mg Per Tube BID  . pantoprazole  40 mg Oral Daily  . simvastatin   20 mg Oral q1800  . sodium chloride  3 mL Intravenous Q12H   Continuous Infusions: . sodium chloride Stopped (08/14/13 1000)  . sodium chloride 20 mL/hr at 08/13/13 1805  . sodium chloride    . dexmedetomidine Stopped (08/13/13 1800)  . DOPamine Stopped (08/14/13 1000)  . insulin (NOVOLIN-R) infusion Stopped (08/14/13 1700)  . lactated ringers 20 mL/hr at 08/14/13 1600  . nitroGLYCERIN Stopped (08/13/13 1400)  . phenylephrine (NEO-SYNEPHRINE) Adult infusion Stopped (08/14/13 0330)   PRN Meds:.metoprolol, morphine injection, ondansetron (ZOFRAN) IV, oxyCODONE, sodium chloride   Physical Exam: General appearance: alert, cooperative and no distress Heart: regular rate and rhythm Lungs: few coarse BS bilaterally  Extremities: No significant LE edema Wound: Dressed and dry  Lab Results: CBC: Recent Labs  08/14/13 1600 08/14/13 1626 08/15/13 0440  WBC 6.3  --  5.1  HGB 8.7* 8.8* 8.3*  HCT 24.3* 26.0* 22.8*  PLT 80*  --  74*   BMET:  Recent Labs  08/14/13 0425  08/14/13 1626 08/15/13 0440  NA 134*  --  139 133*  K 3.9  --  4.2 4.1  CL 105  --  102 101  CO2 24  --   --  25  GLUCOSE 148*  --  126* 111*  BUN 12  --  15 17  CREATININE 0.78  < > 1.10 0.87  CALCIUM 7.8*  --   --  8.3*  < > = values in this interval not displayed.  PT/INR:  Recent Labs  08/13/13 1308  LABPROT 18.0*  INR 1.53*   Radiology: Dg Chest Port 1 View  08/15/2013   CLINICAL DATA:  Postop cardiac surgery, CABG.  EXAM: PORTABLE CHEST - 1 VIEW  COMPARISON:  08/14/2013.  FINDINGS: Right IJ Swan-Ganz catheter has been removed with catheter sheath remaining in place. Mediastinal drain and left chest tube have been removed as well. Epicardial pacer wires remain in place. Six intact sternotomy wires are unchanged in position.  There are changes of bullous emphysema with collapse/ consolidation in the left lower lobe. Improving aeration at the right lung base. Small bilateral pleural effusions. No  definite pneumothorax.  IMPRESSION: 1. Persistent left lower lobe collapse/ consolidation. 2. Residual, but improved, right basilar airspace disease. 3. Small bilateral effusions. 4. No definite pneumothorax. 5. Bullous emphysema.   Electronically Signed   By: Leanna Battles M.D.   On: 08/15/2013 07:51   Dg Chest Portable 1 View In Am  08/14/2013   CLINICAL DATA:  Coronary artery disease  EXAM: PORTABLE CHEST - 1 VIEW  COMPARISON:  Jeneen Montgomery  FINDINGS: Endotracheal and NG tubes removed. Left pneumothorax resolved. Lungs are less aerated with increase bibasilar atelectasis. Interstitial edema persists. Stable chest tubes. Stable Swan-Ganz catheter.  IMPRESSION: Extubated.  Increased basilar atelectasis.  Stable interstitial edema.  Left pneumothorax resolved.   Electronically Signed   By: Maryclare Bean M.D.   On: 08/14/2013 07:50   Dg Chest Portable 1 View  08/13/2013   CLINICAL DATA:  Coronary artery disease.  EXAM: PORTABLE CHEST - 1 VIEW  COMPARISON:  08/09/2013  FINDINGS: Endotracheal tube, NG tube, chest tubes, and Swan-Ganz catheter appear in good position. New CABG. Slight bilateral pulmonary edema. Small left effusion and left base atelectasis. Very tiny left apical medial pneumothorax.  IMPRESSION: Slight bilateral pulmonary edema. Left base atelectasis and small effusion. Very tiny left apical pneumothorax.   Electronically Signed   By: Geanie Cooley M.D.   On: 08/13/2013 13:39     Assessment/Plan: S/P Procedure(s) (LRB): CORONARY ARTERY BYPASS GRAFTING (CABG) (N/A)  CV- Maintaining SR, BPs stable.  Back on home dose of Lopressor.  Pulm- on 4L O2, coughing up thick sputum. CXR shows LLL consolidation/atelectasis.  Continue IS, Mucinex, nebs, will add flutter valve.  Vol overload- Continue diuresis.  Elevated CBGs- no h/o DM.  Continue low dose Levemir, SSI.  Expected postop blood loss anemia- H/H decreased slightly after transfusion yesterday.  Will watch.  Postoperative thrombocytopenia-  plts decreased today.  Watch.  If they decrease further, may need to consider HIT panel.  CRPI, ambulation.  Possibly can d/c Foley today and tx to stepdown.   COLLINS,GINA H 08/15/2013 8:16 AM  Bibasilar atelectasis and probable small left pleural effusion in setting of severe COPD/ bullous emphysema- continue nebs and IS, add flutter  No heparin secondary to thrombocytopenia  transfer to PTCU if bed available

## 2013-08-15 NOTE — Progress Notes (Signed)
Pt transferred to 2W23. Pt ambulated part of distance, then transferred via wheelchair. Pt on 4LNC and portable tele monitor for transfer. Tolerated well. Receiving RN at bedside on arrival, placed on receiving units tele monitor and 4LNC. Family present for transfer. Pt belongings sent with pt, will continue to monitor. Koren Bound

## 2013-08-15 NOTE — Plan of Care (Signed)
Problem: Phase III Progression Outcomes Goal: Time patient transferred to PCTU/Telemetry POD Outcome: Completed/Met Date Met:  08/15/13 Transfer ~08/15/13 @ 1120

## 2013-08-15 NOTE — Progress Notes (Signed)
Chaplain offered spiritual/emotional care to pt and family. Pt's family impressed by how "good" the patient looks, but pt says he feels "terrible." Pt expressed satisfaction to be moving to stepdown unit. Chaplain explained that chaplains are available for emotional support to patients and families and be contacted through pt's nurses.   Chaplain provided emotional/spiritual support, presence, and empathic listening.   Maurene Capes, [place] [phone]

## 2013-08-16 DIAGNOSIS — Z951 Presence of aortocoronary bypass graft: Secondary | ICD-10-CM

## 2013-08-16 LAB — BASIC METABOLIC PANEL
BUN: 18 mg/dL (ref 6–23)
CO2: 24 mEq/L (ref 19–32)
Chloride: 98 mEq/L (ref 96–112)
Creatinine, Ser: 0.8 mg/dL (ref 0.50–1.35)
GFR calc Af Amer: >90 mL/min (ref >90)
Glucose, Bld: 133 mg/dL — ABNORMAL HIGH (ref 70–99)
Potassium: 4.4 mEq/L (ref 3.5–5.1)

## 2013-08-16 LAB — CBC
HCT: 25.5 % — ABNORMAL LOW (ref 39.0–52.0)
Hemoglobin: 9.1 g/dL — ABNORMAL LOW (ref 13.0–17.0)
MCV: 105.8 fL — ABNORMAL HIGH (ref 78.0–100.0)
RBC: 2.41 MIL/uL — ABNORMAL LOW (ref 4.22–5.81)
RDW: 17.7 % — ABNORMAL HIGH (ref 11.5–15.5)
WBC: 6.5 10*3/uL (ref 4.0–10.5)

## 2013-08-16 MED ORDER — METOPROLOL TARTRATE 25 MG PO TABS
25.0000 mg | ORAL_TABLET | Freq: Two times a day (BID) | ORAL | Status: DC
  Administered 2013-08-16 – 2013-08-20 (×9): 25 mg via ORAL
  Filled 2013-08-16 (×11): qty 1

## 2013-08-16 MED ORDER — AMIODARONE HCL 200 MG PO TABS
400.0000 mg | ORAL_TABLET | Freq: Two times a day (BID) | ORAL | Status: DC
  Administered 2013-08-16 – 2013-08-20 (×9): 400 mg via ORAL
  Filled 2013-08-16 (×11): qty 2

## 2013-08-16 MED ORDER — LACTULOSE 10 GM/15ML PO SOLN
20.0000 g | Freq: Every day | ORAL | Status: DC | PRN
  Filled 2013-08-16: qty 30

## 2013-08-16 NOTE — Progress Notes (Signed)
CARDIAC REHAB PHASE I   PRE:  Rate/Rhythm: 66 sR    BP: sitting 128/66    SaO2: 98 4L  MODE:  Ambulation: 230 ft   POST:  Rate/Rhythm: 76 SR    BP: sitting 109/61     SaO2: 92-93 4L  First walk today. DOE, several rest stops. SaO2 hard to register, esp while walking. Began on 3L. Pt had episode of dizziness where he felt like he might pass out however he did not mention this at the time, just that he didn't feel good. Increased to 4L and pt able to return to room. To recliner, encouraged IS and flutter. Pt exerted. [phone]   Elissa Lovett [place] CES, ACSM 08/16/2013 2:22 PM

## 2013-08-16 NOTE — Progress Notes (Signed)
Patient on Atrial fibrillation with RVR as confirmed with 12 leads EKG,B/P is 138/76 , HR at 130s to 140s,asymptomatic.Dr. Cornelius Moras made aware with order to start amiodarone drip with bolus.will continue to monitor.

## 2013-08-16 NOTE — Progress Notes (Addendum)
      [address]       [place] [address]             [phone]      3 Days Post-Op Procedure(s) (LRB): CORONARY ARTERY BYPASS GRAFTING (CABG) (N/A)  Subjective:  Mr. Lua states he had a rough night last night.  He states he did not sleep very much.  He developed A. Fib last night but has since converted with Amiodarone drip. + Productive cough with clear sputum  No BM  Objective: Vital signs in last 24 hours: Temp:  [97.8 F (36.6 C)-98.7 F (37.1 C)] 98 F (36.7 C) (10/30 0437) Pulse Rate:  [73-142] 114 (10/30 0437) Cardiac Rhythm:  [-] Atrial fibrillation (10/29 2200) Resp:  [16-27] 20 (10/30 0437) BP: (110-156)/(58-77) 110/58 mmHg (10/30 0437) SpO2:  [91 %-98 %] 94 % (10/30 0437) Weight:  [164 lb 14.5 oz (74.8 kg)] 164 lb 14.5 oz (74.8 kg) (10/30 0437)  Intake/Output from previous day: 10/29 0701 - 10/30 0700 In: 1010 [P.O.:960; I.V.:50] Out: 925 [Urine:925]  General appearance: alert, cooperative and no distress Heart: regular rate and rhythm Lungs: clear to auscultation bilaterally Abdomen: soft, non-tender; bowel sounds normal; no masses,  no organomegaly Extremities: edema trace Wound: clean and dry  Lab Results:  Recent Labs  08/15/13 0440 08/16/13 0441  WBC 5.1 6.5  HGB 8.3* 9.1*  HCT 22.8* 25.5*  PLT 74* 84*   BMET:  Recent Labs  08/15/13 0440 08/16/13 0441  NA 133* 135  K 4.1 4.4  CL 101 98  CO2 25 24  GLUCOSE 111* 133*  BUN 17 18  CREATININE 0.87 0.80  CALCIUM 8.3* 8.8    PT/INR:  Recent Labs  08/13/13 1308  LABPROT 18.0*  INR 1.53*   ABG    Component Value Date/Time   PHART 7.370 08/14/2013 0423   HCO3 22.7 08/14/2013 0423   TCO2 22 08/14/2013 1626   ACIDBASEDEF 2.0 08/14/2013 0423   O2SAT 90.0 08/14/2013 0423   CBG (last 3)   Recent Labs  08/15/13 1151 08/15/13 1602 08/15/13 2055  GLUCAP 104* 96 114*    Assessment/Plan: S/P Procedure(s) (LRB): CORONARY ARTERY BYPASS GRAFTING (CABG)  (N/A)  1. CV- A. Fib with RVR last night- currently Sinus tach- will continue Lopressor at 12.5 mg BID, switch to PO Amiodarone 2. Pulm- wean oxygen as tolerated, + chest congestion encouraged use of flutter valve 3. Renal- creatinine WNL, mildly volume overloaded continue diuresis 4. Thrombocytopenia- remains stable 5. Dispo- patient with rough night, transition Amiodarone to PO, would benefit from increase in Lopressor dose once pressure allows, continue current care, hopefully discharge over the weekend   LOS: 3 days    BARRETT, ERIN 08/16/2013  Patient seen and examined. Agree with above i think he can tolerate an increase of his lopressor to 25 mg BID

## 2013-08-16 NOTE — Discharge Summary (Signed)
Physician Discharge Summary  Patient ID: Jerry Davenport MRN: [id number] DOB/AGE: 1941-09-25 72 y.o.  Admit date: 08/13/2013 Discharge date: 08/20/2013  Admission Diagnoses:  Patient Active Problem List   Diagnosis Date Noted  . 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 Hematuria 08/07/2013  . CAD (coronary artery disease) 07/26/2013  . Chest pain 07/16/2013  . SOB (shortness of breath) 07/16/2013  . Smoker 07/16/2013  . PAD (peripheral artery disease) 07/16/2013  . Hyperlipidemia 07/16/2013   Discharge Diagnoses:   Patient Active Problem List   Diagnosis Date Noted  . 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 Hematuria 08/07/2013  . CAD (coronary artery disease) 07/26/2013  . Chest pain 07/16/2013  . SOB (shortness of breath) 07/16/2013  . Smoker 07/16/2013  . PAD (peripheral artery disease) 07/16/2013  . Hyperlipidemia 07/16/2013   Discharged Condition: good  History of Present Illness:   Mr. Jerry Davenport is a 72 year old gentleman who recently had to undergo a DOT physical.  He went to Dr. Juanetta Davenport and as part of his evaluation had an EKG. 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  Due to this finding he was referred to Dr. Mariah Davenport and a cardiac catheterization. That revealed severe three-vessel coronary disease with an 80% LAD stenosis and total occlusion of the right coronary and left circumflex, which filled via collaterals from the LAD. Left ventriculogram showed inferior akinesis.  The patient was subsequently referred to Dr. Dorris Davenport for possible Coronary Bypass Grafting.  He was evaluated on 08/07/2013 at which time the patient admitted to experiencing episodic chest pain in the past.  He describes the pain as being sharp and can last up to 15 minutes.  He also states he has episodes of exertional of shortness of breath prompting him to stop walking to regain his breath.  It was felt the patient would benefit from Coronary Bypass Grafting procedure.  The risks and benefits of the procedure were explained to the  patient and he was agreeable to proceed.  Due to his long standing history of smoking he would require Pulmonary function studies prior to proceeding with surgery.  He was also counseled extensively on smoking cessation.   Hospital Course:   The patient presented to [facility] on 08/13/2013.  He was taken to the operating room and underwent CABG x 3 utilizing LIMA to LAD, SVG to OM2 and SVG to PDA.  The patient also underwent Endoscopic Saphenous vein harvest of his left thigh.  He tolerated the procedure well and was taken to the SICU in stable condition.  The patient was extubated the evening of surgery.  During his stay in the ICU the patient was weaned off all cardiac drips.  He was transfused for expected acute blood loss anemia.  His chest tubes and arterial lines were removed without difficulty.  The patient has severe COPD/Bullous Emphysema.   He was producing large amounts of thick sputum and was placed on nebulizer treatments in addition to aggressive flutter valve use.  He was transferred to the step down unit in stable condition.  The patient developed Atrial Fibrillation with RVR.  He was placed on IV Amiodarone with successful conversion to NSR.  The patient has continued to progress.  He has a history of tobacco abuse and COPD. He will require 2 liters of oxygen via [place]. He is ambulating with assistance of a walker.  He is tolerating a regular diet.  Should no issues arise we anticipate discharge home in the next 24-48 hours.  He will  need to follow up with Dr. Dorris Davenport on 09/11/2013 with a CXR prior to his appointment.  He will also need to follow up with Dr. Mariah Davenport in 2-4 weeks.  Significant Diagnostic Studies: angiography:   Severe three-vessel disease. He had occluded mid RCA, occluded mid left circumflex, patent ramus and patent LAD with severe stenotic lesion in the proximal to mid LAD estimated at 80%. It collaterals extending to the distal RCA and left  circumflex.  Treatments: surgery:   Median sternotomy, extracorporeal circulation, coronary artery bypass grafting x3 (left internal mammary artery to the left anterior descending, saphenous vein graft to obtuse marginal 2, saphenous vein graft to posterior descending), endoscopic vein harvest, left thigh.  Disposition: Home  Discharge Medications:    Medication List    STOP taking these medications       aspirin 81 MG tablet  Replaced by:  aspirin 325 MG EC tablet     nitroGLYCERIN 0.4 MG SL tablet  Commonly known as:  NITROSTAT      TAKE these medications       acetaminophen 500 MG tablet  Commonly known as:  TYLENOL  Take 1,000 mg by mouth every 6 (six) hours as needed for pain.     amiodarone 200 MG tablet  Commonly known as:  PACERONE  Take 1 tablet (200 mg total) by mouth 2 (two) times daily.     aspirin 325 MG EC tablet  Take 1 tablet (325 mg total) by mouth daily.     furosemide 40 MG tablet  Commonly known as:  LASIX  Take 1 tablet (40 mg total) by mouth daily. For 5 days then stop.     lisinopril 5 MG tablet  Commonly known as:  PRINIVIL,ZESTRIL  Take 1 tablet (5 mg total) by mouth daily.     metoprolol tartrate 25 MG tablet  Commonly known as:  LOPRESSOR  Take 1 tablet (25 mg total) by mouth 2 (two) times daily.     oxyCODONE 5 MG immediate release tablet  Commonly known as:  Oxy IR/ROXICODONE  Take 1 tablet (5 mg total) by mouth every 4 (four) hours as needed for pain.     potassium chloride 10 MEQ tablet  Commonly known as:  K-DUR  Take 1 tablet (10 mEq total) by mouth daily. For 5 days then stop.     pravastatin 40 MG tablet  Commonly known as:  PRAVACHOL  Take 40 mg by mouth daily with supper.        The patient has been discharged on:   1.Beta Blocker:  Yes [x   ]                              No   [   ]                              If No, reason:  2.Ace Inhibitor/ARB: Yes [  x ]                                     No  [   ]  If No, reason: Labile Blood Pressure  3.Statin:   Yes [ x  ]                  No  [   ]                  If No, reason:  4.Ecasa:  Yes  [ x  ]                  No   [   ]                  If No, reason:     Discharge Orders   Future Appointments Provider Department Dept Phone   09/07/2013 1:30 PM Antonieta Iba, MD [facility] [place] [phone]   09/11/2013 10:45 AM Loreli Slot, MD [facility] [phone]   Future Orders Complete By Expires   Amb Referral to Cardiac Rehabilitation  As directed    Comments:     To [place]     Follow-up Information   Follow up with Loreli Slot, MD On 09/11/2013. (Appointment is at 10:45)    Specialty:  Cardiothoracic Surgery   Contact information:   [address] [place] [address] [place] [address] [phone]       Follow up with  [facility] On 09/11/2013. (Please get CXR at 9:45)    Contact information:   [place]       Schedule an appointment as soon as possible for a visit with Julien Nordmann, MD. (Please contact office to set up 2- 4 week follow up)    Specialty:  Cardiology   Contact information:   [facility] [address] [place] [address] [phone]       Signed: Ardelle Balls 08/20/2013, 8:35 AM

## 2013-08-17 LAB — GLUCOSE, CAPILLARY
Glucose-Capillary: 102 mg/dL — ABNORMAL HIGH (ref 70–99)
Glucose-Capillary: 81 mg/dL (ref 70–99)

## 2013-08-17 MED ORDER — LISINOPRIL 5 MG PO TABS
5.0000 mg | ORAL_TABLET | Freq: Every day | ORAL | Status: DC
Start: 2013-08-17 — End: 2013-08-20
  Administered 2013-08-17 – 2013-08-20 (×4): 5 mg via ORAL
  Filled 2013-08-17 (×5): qty 1

## 2013-08-17 NOTE — Progress Notes (Signed)
Pt. assisted back to bed after large bowl movement.  Pt appeared short of breath and stated "I do not feel well, it is going to be a bad night".  RN assessed vital signs BP 106/66, heart rate 109, and Oxygen 88.  Pt put on 2L of Oxygen and oxygen level increased to 96.  Pt resting, call light in reach, will continue to monitor.   Thane Edu, RN

## 2013-08-17 NOTE — Progress Notes (Signed)
Ed completed with pt and family. Voiced understanding. Pt wants to quit smoking and sts he quit before with lemon drops. Discussed options and plan, gave resources. Needs significant diet change. Interested in [facility] and will send referral to [facility]. To watch video later.  [id number] Ethelda Chick CES, ACSM 12:21 PM 08/17/2013

## 2013-08-17 NOTE — Progress Notes (Signed)
EPW pulled per protocol. VSS. Pt informed of bedrest for one hour. Verbalized understanding. Will continue to monitor pt closely.

## 2013-08-17 NOTE — Progress Notes (Addendum)
      [address]       [place] [address]             [phone]      4 Days Post-Op Procedure(s) (LRB): CORONARY ARTERY BYPASS GRAFTING (CABG) (N/A)  Subjective:  Mr. Iorio states he is doing okay.  He ambulated yesterday with some dyspnea and decrease in oxygen saturations.  No BM  Objective: Vital signs in last 24 hours: Temp:  [98.1 F (36.7 C)-98.6 F (37 C)] 98.6 F (37 C) (10/31 0607) Pulse Rate:  [66-78] 73 (10/31 0607) Cardiac Rhythm:  [-] Normal sinus rhythm (10/30 1955) Resp:  [20] 20 (10/31 0607) BP: (119-136)/(63-76) 136/64 mmHg (10/31 0607) SpO2:  [94 %-100 %] 94 % (10/31 0607) Weight:  [162 lb 7.7 oz (73.7 kg)] 162 lb 7.7 oz (73.7 kg) (10/31 0607)  Intake/Output from previous day: 10/30 0701 - 10/31 0700 In: 480 [P.O.:480] Out: 1500 [Urine:1500]  General appearance: alert, cooperative and no distress Heart: regular rate and rhythm Lungs: diminished breath sounds bibasilar Abdomen: soft, non-tender; bowel sounds normal; no masses,  no organomegaly Extremities: edema none appreciated Wound: clean and dry  Lab Results:  Recent Labs  08/15/13 0440 08/16/13 0441  WBC 5.1 6.5  HGB 8.3* 9.1*  HCT 22.8* 25.5*  PLT 74* 84*   BMET:  Recent Labs  08/15/13 0440 08/16/13 0441  NA 133* 135  K 4.1 4.4  CL 101 98  CO2 25 24  GLUCOSE 111* 133*  BUN 17 18  CREATININE 0.87 0.80  CALCIUM 8.3* 8.8    PT/INR: No results found for this basename: LABPROT, INR,  in the last 72 hours ABG    Component Value Date/Time   PHART 7.370 08/14/2013 0423   HCO3 22.7 08/14/2013 0423   TCO2 22 08/14/2013 1626   ACIDBASEDEF 2.0 08/14/2013 0423   O2SAT 90.0 08/14/2013 0423   CBG (last 3)   Recent Labs  08/16/13 0556 08/16/13 2033 08/17/13 0605  GLUCAP 121* 102* 100*    Assessment/Plan: S/P Procedure(s) (LRB): CORONARY ARTERY BYPASS GRAFTING (CABG) (N/A)  1. CV- Previous A. Fib, currently NSR- continue Amiodarone, Lopressor, will  start low dose ACE for mild hypertension, decreased EF 2. Pulm- + COPD/Emphysema, hypoxemia currently on 3L with resting sats of 90 % will continue to attempt and wean oxygen, however may ultimately require home use, continue IS 3. Renal- mildly volume overloaded, continue diuresis 4. Dispo- will d/c EPW today, work on weaning oxygen, however will place care management consult for likely home use, still hopeful to d/c over the weekend    LOS: 4 days    BARRETT, ERIN 08/17/2013  Patient seen and examined, agree with above. He looks much better today. Continue to try to wean O2 Hopefully home in AM May need home O2

## 2013-08-17 NOTE — Progress Notes (Addendum)
Pt up in restroom upon checking on pt. Pt reeducated on importance of maintaining bedrest. Verbalized understanding. No arrhthymias noted on tele. VSS.PA notified. No new orders received. Will continue to monitor pt closely.

## 2013-08-17 NOTE — Progress Notes (Signed)
CARDIAC REHAB PHASE I   PRE:  Rate/Rhythm: 70 SR    BP: sitting 117/61    SaO2: 98 3L, 94 RA on forehead  MODE:  Ambulation: 350 ft   POST:  Rate/Rhythm: 86 SR    BP: sitting 109/60     SaO2: 94-95 Ra on forehead  Pt able to walk entire distance with RW on RA. Probe on forehead registered very well and SaO2 maintained >93. Pt doesn't seem as DOE or fatigued today although did require x2 rest for leg fatigue. Sts he does have PVD. Left on RA in room and notified RN. Will ed with wife later.  [id number]   Harriet Masson CES, ACSM 08/17/2013 9:53 AM

## 2013-08-17 NOTE — Progress Notes (Signed)
Pt walked 250 ft wiith a rolling walker and O2 @ 3L. Pt did not show any sign of distress. Pt said he felt much better than the previous walk. Pt returned to room and made comfortable in bed, call bell placed within reach, will continue plan of care.

## 2013-08-18 LAB — BASIC METABOLIC PANEL
BUN: 20 mg/dL (ref 6–23)
CO2: 24 mEq/L (ref 19–32)
Calcium: 8.3 mg/dL — ABNORMAL LOW (ref 8.4–10.5)
Chloride: 94 mEq/L — ABNORMAL LOW (ref 96–112)
Creatinine, Ser: 0.86 mg/dL (ref 0.50–1.35)
GFR calc Af Amer: >90 mL/min (ref >90)
GFR calc non Af Amer: 84 mL/min — ABNORMAL LOW (ref >90)
Glucose, Bld: 99 mg/dL (ref 70–99)
Potassium: 4.3 mEq/L (ref 3.5–5.1)
Sodium: 130 mEq/L — ABNORMAL LOW (ref 135–145)

## 2013-08-18 LAB — MAGNESIUM: Magnesium: 1.9 mg/dL (ref 1.5–2.5)

## 2013-08-18 MED ORDER — AMIODARONE HCL 200 MG PO TABS
200.0000 mg | ORAL_TABLET | Freq: Once | ORAL | Status: AC
  Administered 2013-08-18: 200 mg via ORAL
  Filled 2013-08-18: qty 1

## 2013-08-18 MED ORDER — LEVALBUTEROL HCL 0.63 MG/3ML IN NEBU
0.6300 mg | INHALATION_SOLUTION | Freq: Three times a day (TID) | RESPIRATORY_TRACT | Status: DC
  Administered 2013-08-18 – 2013-08-19 (×4): 0.63 mg via RESPIRATORY_TRACT
  Filled 2013-08-18 (×8): qty 3

## 2013-08-18 NOTE — Progress Notes (Signed)
Removed ct sutures. Pt tolerated well.steri- strips placed.

## 2013-08-18 NOTE — Progress Notes (Signed)
Pt's rhythm converted to atrial fib at 2115.  Pt stated that his chest "felt funny" but denies chest pain or shob.  He converted while taking his evening meds including amiodarone and beta blocker.  After 45 min pt still in atrial fib, rate 90- 110's. BP- 124/74, O2 sat 88% on room air.  O2 2L per nasal cannula applied with O2 sat up to 95%.  Dr. Donata Clay notified, order received for additional 200 mg Amiodarone to be given at 2300.

## 2013-08-18 NOTE — Progress Notes (Signed)
CARDIAC REHAB PHASE I   PRE:  Rate/Rhythm: 71 SR  BP:  Sitting: 121/60     SaO2: 96 3L, 95 RA, finger  MODE:  Ambulation: 400 ft   POST:  Rate/Rhythm: 81 SR  BP:  Sitting: 102/58    SaO2: 92 RA, finger  Pt walked 400 ft with RW at a steady pace and rest x2.  No c/o of pain or SOB with ambulation.  Pts SaO2 remained stable and >91% RA during ambulation. [id number]  Marvene Staff MS, ACSM RCEP 10:17 AM 08/18/2013

## 2013-08-18 NOTE — Progress Notes (Signed)
MD paged and made aware of pt. Rhythm change.  New orders given, pt. Resting with 2L of  oxygen, call light in reach. RN will continue to monitor.     Thane Edu, RN

## 2013-08-18 NOTE — Progress Notes (Signed)
Pt ambulated in hallway 350 ft with rolling walker with standby assist on room air. Pt tolerated activity well. Will continue to montior

## 2013-08-18 NOTE — Progress Notes (Addendum)
       [address]       [place] [address]             [phone]          5 Days Post-Op Procedure(s) (LRB): CORONARY ARTERY BYPASS GRAFTING (CABG) (N/A)  Subjective: Comfortable, no complaints.  +productive cough.   Objective: Vital signs in last 24 hours: Patient Vitals for the past 24 hrs:  BP Temp Temp src Pulse Resp SpO2 Weight  08/18/13 0851 - - - - - 95 % -  08/18/13 0516 111/80 mmHg 97.7 F (36.5 C) Oral 103 20 95 % 162 lb 7.7 oz (73.7 kg)  08/17/13 2232 - - - - 20 96 % -  08/17/13 2230 106/66 mmHg - - 109 20 88 % -  08/17/13 1957 127/77 mmHg 98 F (36.7 C) Oral 75 19 90 % -  08/17/13 1933 - - - - - 90 % -  08/17/13 1438 - - - - - 90 % -  08/17/13 1432 122/66 mmHg - - - - - -  08/17/13 1230 120/93 mmHg - - 67 - - -  08/17/13 1220 105/66 mmHg - - 71 - - -  08/17/13 1049 119/75 mmHg - - 71 - - -   Current Weight  08/18/13 162 lb 7.7 oz (73.7 kg)   PRE-OPERATIVE WEIGHT: 70 kg   Intake/Output from previous day: 10/31 0701 - 11/01 0700 In: 200 [P.O.:200] Out: 401 [Urine:401]    PHYSICAL EXAM:  Heart: RRR Lungs: Few exp wheezes bilaterally Wound: Clean and dry Extremities: Trace LE edema    Lab Results: CBC: Recent Labs  08/16/13 0441  WBC 6.5  HGB 9.1*  HCT 25.5*  PLT 84*      BMET:  Recent Labs  08/16/13 0441 08/18/13 0427  NA 135 130*  K 4.4 4.3  CL 98 94*  CO2 24 24  GLUCOSE 133* 99  BUN 18 20  CREATININE 0.80 0.86  CALCIUM 8.8 8.3*    PT/INR: No results found for this basename: LABPROT, INR,  in the last 72 hours    Assessment/Plan: S/P Procedure(s) (LRB): CORONARY ARTERY BYPASS GRAFTING (CABG) (N/A)  CV- Telemetry with short bursts AF overnight and this am, nonsustained, few beats VT.  BPs stable.  Continue Amio, Lopressor, ACE-I.  May need to increase Lopressor if he continues to have runs AF/VT, but currently SBPs borderline.  Pulm- still requiring 2L O2, so will arrange home O2.  Continue  pulm toilet, wean as able.  Vol overload- diurese.  Pt hoping to go home today,but with continued rhythm issues and hypoxemia, will keep one more day and watch. Hopefully home in am if remains stable.   LOS: 5 days    COLLINS,GINA H 08/18/2013  Wean oxygen Keep in  the hospital overnight do to recent recurrence of rapid atrial fibrillation patient examined and medical record reviewed,agree with above note. VAN TRIGT III,Jorrell Kuster 08/18/2013

## 2013-08-19 MED ORDER — DILTIAZEM HCL 100 MG IV SOLR
5.0000 mg/h | INTRAVENOUS | Status: DC
  Filled 2013-08-19: qty 100

## 2013-08-19 MED ORDER — DILTIAZEM HCL ER COATED BEADS 120 MG PO CP24
120.0000 mg | ORAL_CAPSULE | Freq: Every day | ORAL | Status: DC
  Administered 2013-08-19: 120 mg via ORAL
  Filled 2013-08-19: qty 1

## 2013-08-19 NOTE — Progress Notes (Signed)
Pt converted back to NSR. Dr Donata Clay on floor made aware. Will hold off on Cardizem gtt for right now per Dr. Donata Clay. Pt's vital signs stable. Will continue to monitor.

## 2013-08-19 NOTE — Progress Notes (Signed)
Pt ambulated 500 ft in hall with O2 and rolling walker. HR 72, NSR before and after walk. HR 80, NSR during walk. No shob during walk.

## 2013-08-19 NOTE — Progress Notes (Signed)
Pt ambulated in hallway 350 ft with rolling walker on 2 liters of oxygen. Pt tolerated activity well. Will continue to monitor.  

## 2013-08-19 NOTE — Progress Notes (Addendum)
       [address]       [place] [address]             [phone]          6 Days Post-Op Procedure(s) (LRB): CORONARY ARTERY BYPASS GRAFTING (CABG) (N/A)  Subjective: Went back into AF overnight. Given an additional dose of Amiodarone.  Currently in AF, rates 100s on telemetry. Denies symptoms.   Objective: Vital signs in last 24 hours: Patient Vitals for the past 24 hrs:  BP Temp Temp src Pulse Resp SpO2 Weight  08/19/13 0824 107/69 mmHg - - 110 - 93 % -  08/19/13 0820 - - - - - 84 % -  08/19/13 0745 108/51 mmHg - - 130 - 97 % -  08/19/13 0418 112/78 mmHg 98.1 F (36.7 C) Oral 109 18 98 % 158 lb 11.7 oz (72 kg)  08/18/13 2204 - - - - - 95 % -  08/18/13 2200 124/74 mmHg - - 95 - 88 % -  08/18/13 2123 - - - 90 18 - -  08/18/13 1953 113/62 mmHg 97.9 F (36.6 C) Oral 70 18 100 % -  08/18/13 1408 122/69 mmHg 97.4 F (36.3 C) Oral 65 20 96 % -  08/18/13 1016 102/58 mmHg - - 74 - - -  08/18/13 0851 - - - - - 95 % -   Current Weight  08/19/13 158 lb 11.7 oz (72 kg)  PRE-OPERATIVE WEIGHT: 70 kg    Intake/Output from previous day: 11/01 0701 - 11/02 0700 In: 1200 [P.O.:1200] Out: 930 [Urine:930]    PHYSICAL EXAM:  Heart: Irr irr Lungs:Exp wheezes bilaterally Wound: Clean and dry Extremities: Trace LE edema    Lab Results: CBC:No results found for this basename: WBC, HGB, HCT, PLT,  in the last 72 hours BMET:  Recent Labs  08/18/13 0427  NA 130*  K 4.3  CL 94*  CO2 24  GLUCOSE 99  BUN 20  CREATININE 0.86  CALCIUM 8.3*    PT/INR: No results found for this basename: LABPROT, INR,  in the last 72 hours    Assessment/Plan: S/P Procedure(s) (LRB): CORONARY ARTERY BYPASS GRAFTING (CABG) (N/A) CV- AF overnight and this am, rates 100-110s.  BPs stable. Continue Amio, Lopressor, ACE-I. Discussed with MD- will start po Cardizem and watch. Pulm- still requiring 2L O2, home O2 arranged. Continue pulm toilet, wean as able.  Vol  overload- diurese.  Home once rhythm issues resolved.   LOS: 6 days    COLLINS,GINA H 08/19/2013  Persistent recurrent episodes of atrial fibrillation We'll start IV Cardizem in order to convert to sinus and continue oral metoprolol and amiodarone  patient examined and medical record reviewed,agree with above note. VAN TRIGT III,Donnabelle Blanchard 08/19/2013

## 2013-08-20 LAB — BASIC METABOLIC PANEL
BUN: 19 mg/dL (ref 6–23)
CO2: 26 mEq/L (ref 19–32)
Calcium: 8.5 mg/dL (ref 8.4–10.5)
Chloride: 97 mEq/L (ref 96–112)
Creatinine, Ser: 0.95 mg/dL (ref 0.50–1.35)
GFR calc Af Amer: >90 mL/min (ref >90)
GFR calc non Af Amer: 81 mL/min — ABNORMAL LOW (ref >90)
Glucose, Bld: 101 mg/dL — ABNORMAL HIGH (ref 70–99)
Potassium: 4.4 mEq/L (ref 3.5–5.1)
Sodium: 133 mEq/L — ABNORMAL LOW (ref 135–145)

## 2013-08-20 LAB — CBC
HCT: 25 % — ABNORMAL LOW (ref 39.0–52.0)
Hemoglobin: 8.9 g/dL — ABNORMAL LOW (ref 13.0–17.0)
MCH: 38.5 pg — ABNORMAL HIGH (ref 26.0–34.0)
MCHC: 35.6 g/dL (ref 30.0–36.0)
MCV: 108.2 fL — ABNORMAL HIGH (ref 78.0–100.0)
Platelets: 149 10*3/uL — ABNORMAL LOW (ref 150–400)
RBC: 2.31 MIL/uL — ABNORMAL LOW (ref 4.22–5.81)
RDW: 18.1 % — ABNORMAL HIGH (ref 11.5–15.5)
WBC: 4.5 10*3/uL (ref 4.0–10.5)

## 2013-08-20 MED ORDER — POTASSIUM CHLORIDE ER 10 MEQ PO TBCR: 10.0000 meq | EXTENDED_RELEASE_TABLET | Freq: Every day | ORAL | Status: DC

## 2013-08-20 MED ORDER — METOPROLOL TARTRATE 25 MG PO TABS: 25.0000 mg | ORAL_TABLET | Freq: Two times a day (BID) | ORAL | Status: DC

## 2013-08-20 MED ORDER — AMIODARONE HCL 200 MG PO TABS: 200.0000 mg | ORAL_TABLET | Freq: Two times a day (BID) | ORAL | Status: DC

## 2013-08-20 MED ORDER — OXYCODONE HCL 5 MG PO TABS: 5.0000 mg | ORAL_TABLET | ORAL | Status: DC | PRN

## 2013-08-20 MED ORDER — AMIODARONE HCL 400 MG PO TABS: ORAL_TABLET | ORAL | Status: DC

## 2013-08-20 MED ORDER — FUROSEMIDE 40 MG PO TABS: 40.0000 mg | ORAL_TABLET | Freq: Every day | ORAL | Status: DC

## 2013-08-20 MED ORDER — LISINOPRIL 5 MG PO TABS: 5.0000 mg | ORAL_TABLET | Freq: Every day | ORAL | Status: DC

## 2013-08-20 MED ORDER — ASPIRIN 325 MG PO TBEC: 325.0000 mg | DELAYED_RELEASE_TABLET | Freq: Every day | ORAL | Status: DC

## 2013-08-20 NOTE — Progress Notes (Addendum)
SATURATION QUALIFICATIONS: (This note is used to comply with regulatory documentation for home oxygen)  Patient Saturations on Room Air at Rest = 84%   Patient Saturations on Room Air while Ambulating = NA%  Patient Saturations on 0 Liters of oxygen while Ambulating = NA%  Please briefly explain why patient needs home oxygen:

## 2013-08-20 NOTE — Progress Notes (Signed)
CARDIAC REHAB PHASE I   PRE:  Rate/Rhythm: 69 SR    BP: sitting 98/58    SaO2: 97 2L, 94 RA on finger  MODE:  Ambulation: 550 ft   POST:  Rate/Rhythm: 84     BP: sitting 110/60     SaO2: 100 RA forehead entire walk, 95 RA finger after walk  Pt on O2 upon entering room, apparently SaO2 low at night. Did well on RA walking and at rest. Only c/o is fatigued legs which required rest x1. Breathing sounds much better. To recliner. Pt would like RW for home.  [id number]   Harriet Masson CES, ACSM 08/20/2013 9:05 AM

## 2013-08-20 NOTE — Progress Notes (Addendum)
      [address]       [place] [address]             [phone]        7 Days Post-Op Procedure(s) (LRB): CORONARY ARTERY BYPASS GRAFTING (CABG) (N/A)  Subjective: Patient states he feels fairly well and he wants to go home.  Objective: Vital signs in last 24 hours: Temp:  [98 F (36.7 C)-98.4 F (36.9 C)] 98.3 F (36.8 C) (11/03 0437) Pulse Rate:  [66-118] 67 (11/03 0437) Cardiac Rhythm:  [-] Sinus bradycardia (11/03 0050) Resp:  [18-20] 20 (11/03 0437) BP: (95-118)/(58-74) 118/74 mmHg (11/03 0437) SpO2:  [84 %-98 %] 94 % (11/03 0437) Weight:  [72.53 kg (159 lb 14.4 oz)] 72.53 kg (159 lb 14.4 oz) (11/03 0617)  Pre op weight  70 kg Current Weight  08/20/13 72.53 kg (159 lb 14.4 oz)      Intake/Output from previous day: 11/02 0701 - 11/03 0700 In: 840 [P.O.:840] Out: -    Physical Exam:  Cardiovascular: RRR, no murmurs, gallops, or rubs. Pulmonary: Clear to auscultation bilaterally; no rales, wheezes, or rhonchi. Abdomen: Soft, non tender, bowel sounds present. Extremities: Mild bilateral lower extremity edema. Wounds: Clean and dry.  No erythema or signs of infection.  Lab Results: CBC: Recent Labs  08/20/13 0430  WBC 4.5  HGB 8.9*  HCT 25.0*  PLT 149*   BMET:  Recent Labs  08/18/13 0427 08/20/13 0430  NA 130* 133*  K 4.3 4.4  CL 94* 97  CO2 24 26  GLUCOSE 99 101*  BUN 20 19  CREATININE 0.86 0.95  CALCIUM 8.3* 8.5    PT/INR:  Lab Results  Component Value Date   INR 1.53* 08/13/2013   INR 1.01 08/09/2013   INR 1.1 07/16/2013   ABG:  INR: Will add last result for INR, ABG once components are confirmed Will add last 4 CBG results once components are confirmed  Assessment/Plan:  1. CV - A fib with RVR again yesterday. Cardizem gttp ordered, but converted to SR prior to starting gttp. On Amiodarone 400 bid, Lopressor 25 bid, and Lisinopril 5 daily. 2.  Pulmonary - Long history of tobacco abuse and has COPD.On 2  liters of oxygen via [place] as desat's with ambulation. Will arrange for home oxygen.Encourage incentive spirometer 3. Volume Overload - Continue lasix 40 daily 4.  Acute blood loss anemia - H and H 8.9 and 25 5. Discharge later today  ZIMMERMAN,DONIELLE MPA-C 08/20/2013,8:01 AM  I have seen and examined the patient and agree with the assessment and plan as outlined.  OWEN,CLARENCE H 08/20/2013 8:44 AM

## 2013-08-24 ENCOUNTER — Inpatient Hospital Stay: Payer: Self-pay | Admitting: Family Medicine

## 2013-08-24 ENCOUNTER — Telehealth: Payer: Self-pay

## 2013-08-24 LAB — TROPONIN I
Troponin-I: <0.02 ng/mL
Troponin-I: <0.02 ng/mL

## 2013-08-24 LAB — COMPREHENSIVE METABOLIC PANEL
Albumin: 3.6 g/dL (ref 3.4–5.0)
Bilirubin,Total: 0.9 mg/dL (ref 0.2–1.0)
Calcium, Total: 9 mg/dL (ref 8.5–10.1)
Creatinine: 1.2 mg/dL (ref 0.60–1.30)
EGFR (African American): >60
Glucose: 111 mg/dL — ABNORMAL HIGH (ref 65–99)
Osmolality: 265 (ref 275–301)
Potassium: 4.5 mmol/L (ref 3.5–5.1)
SGOT(AST): 19 U/L (ref 15–37)
SGPT (ALT): 18 U/L (ref 12–78)
Sodium: 131 mmol/L — ABNORMAL LOW (ref 136–145)
Total Protein: 7.2 g/dL (ref 6.4–8.2)

## 2013-08-24 LAB — URINALYSIS, COMPLETE
Bilirubin,UR: NEGATIVE
Hyaline Cast: 6
Ketone: NEGATIVE
Leukocyte Esterase: NEGATIVE
Nitrite: NEGATIVE
Ph: 7 (ref 4.5–8.0)
RBC,UR: 1 /HPF (ref 0–5)
Squamous Epithelial: 1

## 2013-08-24 LAB — CK TOTAL AND CKMB (NOT AT ARMC)
CK, Total: 25 U/L — ABNORMAL LOW (ref 35–232)
CK, Total: 20 U/L — ABNORMAL LOW (ref 35–232)

## 2013-08-24 LAB — CBC
HGB: 12.7 g/dL — ABNORMAL LOW (ref 13.0–18.0)
MCHC: 35.1 g/dL (ref 32.0–36.0)
Platelet: 177 10*3/uL (ref 150–440)
RDW: 19.7 % — ABNORMAL HIGH (ref 11.5–14.5)

## 2013-08-24 LAB — PRO B NATRIURETIC PEPTIDE: B-Type Natriuretic Peptide: 4627 pg/mL — ABNORMAL HIGH (ref 0–125)

## 2013-08-24 NOTE — Telephone Encounter (Signed)
Pt's wife called again stating that pt is still "sick on his stomach". C/o being "hot one minute and cold the next". Wife would like to give him dulcolax, as he "needs to move his bowels, but can't". Advised pt that I would make Dr. Mariah Milling aware. Advised wife to call PCP in the meantime to address GI symptoms. She would appreciate a call before the end of the day, as she is worried about him not sleeping all weekend. Pt to call Dr. Juanetta Gosling' office, but would still like Dr. Mariah Milling to address pt's sob.

## 2013-08-24 NOTE — Telephone Encounter (Signed)
Spoke w/ pt's wife.  She states that pt was feeling so bad that he asked her to call EMS, who transported him to [facility] for eval. Instructed her that if they did not keep him for observation, he would need an appt on Monday, and I will all and check on him at that time.

## 2013-08-24 NOTE — Telephone Encounter (Signed)
Pt wife called and states pt had a rough night, states he was vomiting, and couldn't get comfortable. States he hasnt had any CP, SOB, but ever since his bypass surgery has not been able to sleep lying down, he has to sleep in a recliner, Please call.

## 2013-08-24 NOTE — Telephone Encounter (Signed)
Spoke w/ pt's wife.  She states that pt was up at 4am w/ stomach discomfort and didn't feel right. She called EMS who checked pt's vitals that were all WNL. Pt took his meds and has since "settled down". Denies chest pain, reports some sob this am.  Pt is on O2 at home.  Reports that "he can't get comfortable" and has not been sleeping. Reports that since his cath, he has been unable to lay down flat, as it is "heard to breathe". He has been sleeping sitting up in his recliner, but due to hip problems, this is uncomfortable.  Dyspnea lying down is new onset since cath.  Please advise.  Thank you!

## 2013-08-25 DIAGNOSIS — I059 Rheumatic mitral valve disease, unspecified: Secondary | ICD-10-CM

## 2013-08-25 DIAGNOSIS — I4891 Unspecified atrial fibrillation: Secondary | ICD-10-CM

## 2013-08-25 DIAGNOSIS — I509 Heart failure, unspecified: Secondary | ICD-10-CM

## 2013-08-25 LAB — TROPONIN I: Troponin-I: <0.02 ng/mL

## 2013-08-25 LAB — CK TOTAL AND CKMB (NOT AT ARMC)
CK, Total: 21 U/L — ABNORMAL LOW (ref 35–232)
CK-MB: 1 ng/mL (ref 0.5–3.6)

## 2013-08-26 LAB — POTASSIUM: Potassium: 4.1 mmol/L (ref 3.5–5.1)

## 2013-08-26 LAB — CREATININE, SERUM
Creatinine: 1.27 mg/dL (ref 0.60–1.30)
EGFR (African American): >60
EGFR (Non-African Amer.): 56 — ABNORMAL LOW

## 2013-08-27 NOTE — Telephone Encounter (Signed)
Spoke w/ pt's wife.  States that pt went to ED on Friday and was told he has CHF. States that he had bypass surgery previously, but would like more info on how to manage CHF. Pt sched to see Dr. Emmaline Kluver 08/30/13.

## 2013-08-30 ENCOUNTER — Encounter: Payer: Self-pay | Admitting: Cardiovascular Disease

## 2013-08-30 ENCOUNTER — Ambulatory Visit (INDEPENDENT_AMBULATORY_CARE_PROVIDER_SITE_OTHER): Payer: Medicare Other | Admitting: Cardiovascular Disease

## 2013-08-30 ENCOUNTER — Telehealth: Payer: Self-pay

## 2013-08-30 VITALS — BP 84/58 | HR 61 | Ht 69.0 in | Wt 145.5 lb

## 2013-08-30 DIAGNOSIS — I951 Orthostatic hypotension: Secondary | ICD-10-CM

## 2013-08-30 DIAGNOSIS — R0602 Shortness of breath: Secondary | ICD-10-CM

## 2013-08-30 DIAGNOSIS — F17200 Nicotine dependence, unspecified, uncomplicated: Secondary | ICD-10-CM

## 2013-08-30 DIAGNOSIS — R079 Chest pain, unspecified: Secondary | ICD-10-CM

## 2013-08-30 DIAGNOSIS — I4891 Unspecified atrial fibrillation: Secondary | ICD-10-CM | POA: Insufficient documentation

## 2013-08-30 DIAGNOSIS — Z951 Presence of aortocoronary bypass graft: Secondary | ICD-10-CM

## 2013-08-30 NOTE — Telephone Encounter (Signed)
2nd attempt to contact the patient. No answer. Will try again later.

## 2013-08-30 NOTE — Telephone Encounter (Signed)
Attempt to call the patient. No answer and no voice mail. Will call back.

## 2013-08-30 NOTE — Assessment & Plan Note (Signed)
He stop smoking at the end of October 2014

## 2013-08-30 NOTE — Assessment & Plan Note (Signed)
We will change his Lasix to every other day, decrease lisinopril down to one half pill daily. Dose was decreased from 5 mg down to 2.5 mg daily If blood pressure continues to run low, we'll decrease the metoprolol down to 12.5 mg twice a day

## 2013-08-30 NOTE — Assessment & Plan Note (Signed)
Appears to be doing well after bypass surgery. Episode of recurrent atrial fibrillation and admission to the hospital. Maintaining normal sinus rhythm today.

## 2013-08-30 NOTE — Telephone Encounter (Signed)
Pt wife would like to know how many baby aspirin pt is to take. Please call

## 2013-08-30 NOTE — Patient Instructions (Addendum)
You are doing well. Blood pressure is low Please take lasix/furosemide every other day Take lisinopril 1/2 daily  Monitor your blood pressures at home  If blood pressure continues to run low, would decrease the metoprolol down to one half pill twice a day  Please call us if you have new issues that need to be addressed before your next appt.  Your physician wants you to follow-up in: 3 months.  You will receive a reminder letter in the mail two months in advance. If you don't receive a letter, please call our office to schedule the follow-up appointment.

## 2013-08-30 NOTE — Telephone Encounter (Signed)
Spoke w/ pt and his wife. Instructed them not to decrease dose of metoprolol, but to continue taking whole pill, per Dr. Mariah Milling and see if decreasing lasix & lisinsopril will help. Pt to call with any questions or concerns.

## 2013-08-30 NOTE — Assessment & Plan Note (Signed)
We'll continue him on metoprolol 25 mg twice a day with amiodarone 200 mg twice a day. Amiodarone dosing could be decreased in followup with Dr. Dorris Fetch down to one a day if he maintains normal sinus rhythm. We'll need to watch blood pressure as he is running low.

## 2013-08-30 NOTE — Assessment & Plan Note (Signed)
Chronic mild shortness of breath likely from underlying COPD. Appears relatively stable

## 2013-08-30 NOTE — Progress Notes (Addendum)
Patient ID: Jerry Davenport, male    DOB: Dec 09, 1940, 72 y.o.   MRN: [id number]  HPI Comments: Mr. Walthall is a very pleasant 72 year old gentleman with a long history of smoking for more than 60 years, who recently quit smoking October 2014, CAD, bypass surgery x4 08/13/2013, hyperlipidemia, chronic headaches, PAD in the legs followed by Dr. Lorretta Harp in [place], who works as a Hospital doctor, significant home stress (issues with his son ), patient of Dr. Juanetta Gosling who follows up after recent bypass surgery.  Coronary artery bypass grafting x3 (left internal mammary artery to the left anterior descending, saphenous vein graft to obtuse marginal 2, saphenous vein graft to posterior descending  In general he reports that he is doing well following bypass surgery. He did have postoperative atrial fibrillation treated with amiodarone.  He has lost significant weight through the past month. He reports 20 pound weight loss. His wife has been very strict with his diet and only letting him eat steamed vegetables. He was admitted to the hospital 08/24/2013 for recurrent atrial fibrillation, shortness of breath. With medical management, he converted back to normal sinus rhythm. Continued on his amiodarone 200 mg twice a day. He presents today after recent hospital admission. He was given IV Lasix in the hospital. He was discharged on 08/26/2013 Chest x-ray in the hospital 08/24/2013 showed small bilateral pleural effusions left lower lobe atelectasis  Since his discharge, he reports having low blood pressure, occasional dizziness. Blood pressure in the office today 80 systolic. 90s systolic by my check.  Echocardiogram in the hospital showed ejection fraction 40-45%, mild LVH, mildly dilated left atrium, mild to moderate MR  In followup today, he feels that he is doing better, energy is better. Numbness on the left side of his chest. No significant chest pain. No significant shortness of breath apart from mild breathing issues  from long history of smoking.   Previous cardiac catheterization  confirmed severe three-vessel disease. He had occluded mid RCA, occluded mid left circumflex, patent ramus and patent LAD with severe stenotic lesion in the proximal to mid LAD estimated at 80%. It collaterals extending to the distal RCA and left circumflex. Images were shown to Dr. Kirke Corin and after a long discussion, decision was made for referral to cardiothoracic surgery. At the time of the catheterization he was unsure and wanted to think about the decision.  He presents today for further discussion.   EKG shows normal sinus rhythm with rate 61 beats per minute, nonspecific ST abnormality in V3, V6, T wave abnormality 2, 3, aVF    Outpatient Encounter Prescriptions as of 08/30/2013  Medication Sig  . acetaminophen (TYLENOL) 500 MG tablet Take 1,000 mg by mouth every 6 (six) hours as needed for pain.   Marland Kitchen amiodarone (PACERONE) 200 MG tablet Take 1 tablet (200 mg total) by mouth 2 (two) times daily.  Marland Kitchen aspirin EC 325 MG EC tablet Take 1 tablet (325 mg total) by mouth daily.  Marland Kitchen docusate sodium (COLACE) 100 MG capsule Take 100 mg by mouth daily.  . furosemide (LASIX) 40 MG tablet Take 40 mg by mouth daily.  Marland Kitchen lisinopril (PRINIVIL,ZESTRIL) 5 MG tablet Take 1 tablet (5 mg total) by mouth daily.  . metoprolol tartrate (LOPRESSOR) 25 MG tablet Take 1 tablet (25 mg total) by mouth 2 (two) times daily.  Marland Kitchen NITROSTAT 0.4 MG SL tablet Place 0.4 mg under the tongue every 5 (five) minutes as needed.   Marland Kitchen oxyCODONE (OXY IR/ROXICODONE) 5 MG immediate release tablet Take  1 tablet (5 mg total) by mouth every 4 (four) hours as needed for pain.  . polyethylene glycol powder (GLYCOLAX/MIRALAX) powder Take 1 Container by mouth as needed.   . potassium chloride (K-DUR) 10 MEQ tablet Take 10 mEq by mouth daily.  . pravastatin (PRAVACHOL) 40 MG tablet Take 40 mg by mouth daily with supper.   . [DISCONTINUED] furosemide (LASIX) 40 MG tablet Take 1 tablet  (40 mg total) by mouth daily. For 5 days then stop.  . [DISCONTINUED] potassium chloride (K-DUR) 10 MEQ tablet Take 1 tablet (10 mEq total) by mouth daily. For 5 days then stop.     Review of Systems  Constitutional: Negative.   HENT: Negative.   Eyes: Negative.   Gastrointestinal: Negative.   Endocrine: Negative.   Musculoskeletal: Negative.   Skin: Negative.   Allergic/Immunologic: Negative.   Neurological: Negative.   Hematological: Negative.   Psychiatric/Behavioral: Negative.   All other systems reviewed and are negative.    BP 84/58  Pulse 61  Ht 5\' 9"  (1.753 m)  Wt 145 lb 8 oz (65.998 kg)  BMI 21.48 kg/m2 Blood pressure 90/60 on recheck Physical Exam  Nursing note and vitals reviewed. Constitutional: He is oriented to person, place, and time. He appears well-developed and well-nourished.  HENT:  Head: Normocephalic.  Nose: Nose normal.  Mouth/Throat: Oropharynx is clear and moist.  Eyes: Conjunctivae are normal. Pupils are equal, round, and reactive to light.  Neck: Normal range of motion. Neck supple. No JVD present.  Cardiovascular: Normal rate, regular rhythm, S1 normal, S2 normal, normal heart sounds and intact distal pulses.  Exam reveals no gallop and no friction rub.   No murmur heard. Pulses:      Dorsalis pedis pulses are 0 on the right side, and 0 on the left side.       Posterior tibial pulses are 0 on the right side, and 0 on the left side.  Pulmonary/Chest: Effort normal. No respiratory distress. He has decreased breath sounds. He has no wheezes. He has no rales. He exhibits no tenderness.  Abdominal: Soft. Bowel sounds are normal. He exhibits no distension. There is no tenderness.  Musculoskeletal: Normal range of motion. He exhibits no edema and no tenderness.  Lymphadenopathy:    He has no cervical adenopathy.  Neurological: He is alert and oriented to person, place, and time. Coordination normal.  Skin: Skin is warm and dry. No rash noted. No  erythema.  Psychiatric: He has a normal mood and affect. His behavior is normal. Judgment and thought content normal.      Assessment and Plan

## 2013-09-07 ENCOUNTER — Other Ambulatory Visit: Payer: Self-pay | Admitting: *Deleted

## 2013-09-07 ENCOUNTER — Ambulatory Visit: Payer: Medicare Other | Admitting: Cardiovascular Disease

## 2013-09-07 DIAGNOSIS — I2510 Atherosclerotic heart disease of native coronary artery without angina pectoris: Secondary | ICD-10-CM

## 2013-09-11 ENCOUNTER — Encounter: Payer: Self-pay | Admitting: Thoracic Surgery (Cardiothoracic Vascular Surgery)

## 2013-09-11 ENCOUNTER — Ambulatory Visit
Admission: RE | Admit: 2013-09-11 | Discharge: 2013-09-11 | Disposition: A | Discharge status: Home / Self Care | Payer: Medicare Other | Source: Ambulatory Visit | Attending: Thoracic Surgery (Cardiothoracic Vascular Surgery) | Admitting: Thoracic Surgery (Cardiothoracic Vascular Surgery)

## 2013-09-11 ENCOUNTER — Ambulatory Visit (INDEPENDENT_AMBULATORY_CARE_PROVIDER_SITE_OTHER): Payer: Medicare Other | Admitting: Thoracic Surgery (Cardiothoracic Vascular Surgery)

## 2013-09-11 VITALS — BP 103/72 | HR 81 | Resp 16 | Ht 69.0 in | Wt 144.0 lb

## 2013-09-11 DIAGNOSIS — Z951 Presence of aortocoronary bypass graft: Secondary | ICD-10-CM

## 2013-09-11 DIAGNOSIS — I2510 Atherosclerotic heart disease of native coronary artery without angina pectoris: Secondary | ICD-10-CM

## 2013-09-11 NOTE — Progress Notes (Signed)
HPI:  Mr. Crookshanks returns today for a scheduled postoperative followup visit. He has a 72 year old gentleman with history of tobacco abuse and COPD. He underwent coronary artery bypass grafting x3 on October 27. His postoperative course was complicated by bronchitis and atrial fibrillation. He converted to sinus rhythm with amiodarone prior to discharge. He was readmitted over [place] on November 7 again with atrial fibrillation.  Since then he's been doing well. He's complains of some pain and limitation of motion in his left shoulder. He's not having much incisional pain. He is taking one Percocet about every other night before he goes to bed. He drove over here today. He is anxious to return to work. He has not smoked since his surgery. He was having some problems with dizziness and low blood pressure. His Lasix was changed every other day and lisinopril dose was cut in half by Dr. Mariah Milling. His symptoms have resolved since his medication changes were made. His appetite has been poor and he says that food tastes "funny".  Past Medical History  Diagnosis Date  . Hyposmolality and/or hyponatremia   . Other and unspecified hyperlipidemia   . Headache(784.0)   . Backache, unspecified   . Allergic rhinitis, cause unspecified   . Tobacco use disorder   . Other constipation   . Coronary artery disease   . Myocardial infarction   . Hypertension   . Shortness of breath     with exertion, pt. remarks that he "gives out more from his legs than his breathing"   . Stroke     [facility]- pt. told he had a stroke, but he doesn't think he did   . Peripheral vascular disease, unspecified   . GERD (gastroesophageal reflux disease)     indigestion on occasion   . Arthritis     back, legs, knees   . A-fib     Current Outpatient Prescriptions  Medication Sig Dispense Refill  . acetaminophen (TYLENOL) 500 MG tablet Take 1,000 mg by mouth every 6 (six) hours as needed for pain.       Marland Kitchen amiodarone (PACERONE)  200 MG tablet Take 200 mg by mouth daily.      Marland Kitchen aspirin EC 325 MG EC tablet Take 1 tablet (325 mg total) by mouth daily.  30 tablet  0  . docusate sodium (COLACE) 100 MG capsule Take 100 mg by mouth daily.      . furosemide (LASIX) 40 MG tablet Take 40 mg by mouth every other day.       . lisinopril (PRINIVIL,ZESTRIL) 5 MG tablet Take 2.5 mg by mouth daily.      Marland Kitchen loratadine (CLARITIN) 10 MG tablet Take 10 mg by mouth daily.      . metoprolol tartrate (LOPRESSOR) 25 MG tablet Take 12.5 mg by mouth 2 (two) times daily.      Marland Kitchen NITROSTAT 0.4 MG SL tablet Place 0.4 mg under the tongue every 5 (five) minutes as needed.       Marland Kitchen oxyCODONE (OXY IR/ROXICODONE) 5 MG immediate release tablet Take 1 tablet (5 mg total) by mouth every 4 (four) hours as needed for pain.  30 tablet  0  . polyethylene glycol powder (GLYCOLAX/MIRALAX) powder Take 1 Container by mouth as needed.       . potassium chloride (K-DUR) 10 MEQ tablet Take 10 mEq by mouth daily.      . pravastatin (PRAVACHOL) 40 MG tablet Take 40 mg by mouth daily with supper.  No current facility-administered medications for this visit.    Physical Exam BP 103/72  Pulse 81  Resp 16  Ht 5\' 9"  (1.753 m)  Wt 144 lb (65.318 kg)  BMI 21.26 kg/m2  SpO2 82% Thin 72 year old male in no acute distress Alert and oriented x3 with no focal deficits Cardiac regular rate and rhythm normal S1 and S2 Lungs diminished but equal breath sounds bilaterally Sternum stable, incision clean dry and intact Leg incision healing well with some ecchymosis along saphenous vein harvest tract Limited abduction of right shoulder, full passive ROM  Diagnostic Tests: Chest x-ray 09/11/2013 CHEST 2 VIEW  COMPARISON: Portable chest x-ray of 08/15/2013  FINDINGS:  Aeration of the lungs has improved. There is mild bibasilar  atelectasis present. No effusion is seen. Mediastinal contours are  stable. Heart size is stable. Median sternotomy sutures appear  intact.   IMPRESSION:  Improved aeration with mild bibasilar linear atelectasis remaining.  Electronically Signed  By: Dwyane Dee M.D.  On: 09/11/2013 10:15  Impression: 72 year old gentleman who is now a month out from coronary bypass grafting x3. He has had some difficulty with atrial fibrillation. He is currently on 200 mg of amiodarone twice daily. I think at this point it is safe to decrease it to 200 mg daily. Hopefully he can be stopped altogether at his next appointment with Dr. Mariah Milling.  He has already started driving, I reinforced appropriate precautions that he should be taking. He is not to lift anything over 10 pounds for at least another 2 weeks. Given the nature of his workup think he should stay out to February 1. If he can go back to light duty he could start in mid January.  He seems to have some rotator cuff issues in the left shoulder. He had a home physical therapist for walking initially. We will see if we can arrange some physical therapy for him on that left shoulder to help with range of motion and strengthening.  He has quit smoking since his surgery. I congratulated him for that and stressed the importance of abstinence going forward.  Plan: He will continue to followup with Dr. Julien Nordmann  I will be happy to see him back any time in the future if I can be of any further assistance with his care.

## 2013-09-21 ENCOUNTER — Encounter: Payer: Self-pay | Admitting: Cardiovascular Disease

## 2013-09-21 ENCOUNTER — Other Ambulatory Visit: Payer: Self-pay | Admitting: Cardiovascular Disease

## 2013-09-21 NOTE — Telephone Encounter (Signed)
Requested Prescriptions   Signed Prescriptions Disp Refills  . furosemide (LASIX) 40 MG tablet 30 tablet 3    Sig: TAKE 1 TABLET BY MOUTH ONCE DAILY    Authorizing Provider: Antonieta Iba    Ordering User: Meleni Delahunt C  . KLOR-CON 10 10 MEQ tablet 30 tablet 3    Sig: 1 TABLET BY MOUTH EVERY DAY-DO NOT CRUSH    Authorizing Provider: Antonieta Iba    Ordering User: Kendrick Fries

## 2013-09-24 ENCOUNTER — Encounter: Payer: Self-pay | Admitting: Thoracic Surgery (Cardiothoracic Vascular Surgery)

## 2013-09-24 DIAGNOSIS — R0602 Shortness of breath: Secondary | ICD-10-CM

## 2013-09-24 DIAGNOSIS — I4891 Unspecified atrial fibrillation: Secondary | ICD-10-CM

## 2013-09-24 DIAGNOSIS — F17200 Nicotine dependence, unspecified, uncomplicated: Secondary | ICD-10-CM

## 2013-09-24 DIAGNOSIS — I951 Orthostatic hypotension: Secondary | ICD-10-CM

## 2013-09-24 DIAGNOSIS — Z951 Presence of aortocoronary bypass graft: Secondary | ICD-10-CM

## 2013-10-04 ENCOUNTER — Ambulatory Visit (INDEPENDENT_AMBULATORY_CARE_PROVIDER_SITE_OTHER): Payer: Medicare Other | Admitting: Cardiovascular Disease

## 2013-10-04 ENCOUNTER — Telehealth: Payer: Self-pay

## 2013-10-04 ENCOUNTER — Encounter: Payer: Self-pay | Admitting: Cardiovascular Disease

## 2013-10-04 VITALS — BP 110/78 | HR 56 | Ht 69.0 in | Wt 156.0 lb

## 2013-10-04 DIAGNOSIS — E785 Hyperlipidemia, unspecified: Secondary | ICD-10-CM

## 2013-10-04 DIAGNOSIS — I951 Orthostatic hypotension: Secondary | ICD-10-CM

## 2013-10-04 DIAGNOSIS — I4891 Unspecified atrial fibrillation: Secondary | ICD-10-CM

## 2013-10-04 DIAGNOSIS — R079 Chest pain, unspecified: Secondary | ICD-10-CM

## 2013-10-04 DIAGNOSIS — Z951 Presence of aortocoronary bypass graft: Secondary | ICD-10-CM

## 2013-10-04 NOTE — Progress Notes (Signed)
Patient ID: Jerry Davenport, male    DOB: 03/18/1941, 72 y.o.   MRN: [id number]  HPI Comments: [name] is a very pleasant 72 year old gentleman with a long history of smoking for more than 60 years, who recently quit smoking October 2014, CAD, bypass surgery x4 08/13/2013, hyperlipidemia, chronic headaches, PAD in the legs followed by Dr. 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 Juanetta Gosling   who presents for routine followup.  Surgery was October 2014 : Coronary artery bypass grafting x3 (left internal mammary artery to the left anterior descending, saphenous vein graft to obtuse marginal 2, saphenous vein graft to posterior descending  doing well following bypass surgery. postoperative atrial fibrillation treated with amiodarone. lost significant weight,  20 pound weight loss. He was admitted to the hospital 08/24/2013 for recurrent atrial fibrillation, shortness of breath. With medical management, he converted back to normal sinus rhythm.discharged on 08/26/2013 Chest x-ray in the hospital 08/24/2013 showed small bilateral pleural effusions left lower lobe atelectasis Echocardiogram in the hospital showed ejection fraction 40-45%, mild LVH, mildly dilated left atrium, mild to moderate  On his last clinic visit, blood pressure was running low, ACE inhibitor was cut in half, metoprolol cut in half . On today's visit, he continued the full metoprolol twice a day . Blood pressure mildly better with recent weight gain though still runs low .   continued left shoulder problems following the surgery, not participating in shoulder rehabilitation. He did not complete cardiac rehabilitation.  No significant chest pain. No significant shortness of breath apart from mild breathing issues from long history of smoking.   Previous cardiac catheterization  confirmed severe three-vessel disease. He had occluded mid RCA, occluded mid left circumflex,  patent ramus and patent LAD with severe stenotic lesion in the proximal to mid LAD estimated at 80%.  collaterals extending to the distal RCA and left circumflex.   EKG shows normal sinus rhythm with rate 56 beats per minute, nonspecific ST abnormality in V4-V6, T wave abnormality 2, 3, aVF    Outpatient Encounter Prescriptions as of 10/04/2013  Medication Sig  . acetaminophen (TYLENOL) 500 MG tablet Take 1,000 mg by mouth every 6 (six) hours as needed for pain.   Marland Kitchen amiodarone (PACERONE) 200 MG tablet Take 200 mg by mouth daily.  Marland Kitchen aspirin EC 325 MG EC tablet Take 1 tablet (325 mg total) by mouth daily.  Marland Kitchen docusate sodium (COLACE) 100 MG capsule Take 100 mg by mouth daily.  Marland Kitchen ENSURE PLUS (ENSURE PLUS) LIQD Take 1 Can by mouth daily.  . furosemide (LASIX) 40 MG tablet Takes 1 tablet every other day.  Marland Kitchen KLOR-CON 10 10 MEQ tablet 1 TABLET BY MOUTH EVERY DAY-DO NOT CRUSH  . lisinopril (PRINIVIL,ZESTRIL) 5 MG tablet Take 2.5 mg by mouth daily.  Marland Kitchen loratadine (CLARITIN) 10 MG tablet Take 10 mg by mouth daily.  . metoprolol tartrate (LOPRESSOR) 25 MG tablet Take 12.5 mg by mouth 2 (two) times daily.  Marland Kitchen NITROSTAT 0.4 MG SL tablet Place 0.4 mg under the tongue every 5 (five) minutes as needed.   Marland Kitchen oxyCODONE (OXY IR/ROXICODONE) 5 MG immediate release tablet Take 1 tablet (5 mg total) by mouth every 4 (four) hours as needed for pain.  . polyethylene glycol powder (GLYCOLAX/MIRALAX) powder Take 1 Container by mouth as needed.   . pravastatin (PRAVACHOL) 40 MG tablet Take 40 mg by mouth daily with supper.   . [DISCONTINUED] furosemide (LASIX)  40 MG tablet TAKE 1 TABLET BY MOUTH ONCE DAILY     Review of Systems  Constitutional: Negative.   HENT: Negative.   Eyes: Negative.   Respiratory: Negative.   Cardiovascular: Negative.   Gastrointestinal: Negative.   Endocrine: Negative.   Musculoskeletal: Negative.   Skin: Negative.   Allergic/Immunologic: Negative.   Neurological: Negative.    Hematological: Negative.   Psychiatric/Behavioral: Negative.   All other systems reviewed and are negative.    BP 110/78  Pulse 56  Ht 5\' 9"  (1.753 m)  Wt 156 lb (70.761 kg)  BMI 23.03 kg/m2  Physical Exam  Nursing note and vitals reviewed. Constitutional: He is oriented to person, place, and time. He appears well-developed and well-nourished.  HENT:  Head: Normocephalic.  Nose: Nose normal.  Mouth/Throat: Oropharynx is clear and moist.  Eyes: Conjunctivae are normal. Pupils are equal, round, and reactive to light.  Neck: Normal range of motion. Neck supple. No JVD present.  Cardiovascular: Normal rate, regular rhythm, S1 normal, S2 normal, normal heart sounds and intact distal pulses.  Exam reveals no gallop and no friction rub.   No murmur heard. Pulses:      Dorsalis pedis pulses are 0 on the right side, and 0 on the left side.       Posterior tibial pulses are 0 on the right side, and 0 on the left side.  Pulmonary/Chest: Effort normal. No respiratory distress. He has decreased breath sounds. He has no wheezes. He has no rales. He exhibits no tenderness.  Abdominal: Soft. Bowel sounds are normal. He exhibits no distension. There is no tenderness.  Musculoskeletal: Normal range of motion. He exhibits no edema and no tenderness.  Lymphadenopathy:    He has no cervical adenopathy.  Neurological: He is alert and oriented to person, place, and time. Coordination normal.  Skin: Skin is warm and dry. No rash noted. No erythema.  Psychiatric: He has a normal mood and affect. His behavior is normal. Judgment and thought content normal.      Assessment and Plan

## 2013-10-04 NOTE — Assessment & Plan Note (Signed)
Suggested he continue on his statin. Goal LDL less than 70

## 2013-10-04 NOTE — Assessment & Plan Note (Signed)
Blood pressure continues to run on the low side. We have suggested he decrease Lasix down to 20 mg every other day, decrease metoprolol down to 12.5 mg twice a day

## 2013-10-04 NOTE — Assessment & Plan Note (Signed)
Currently with no symptoms of angina. No further workup at this time. Continue current medication regimen. 

## 2013-10-04 NOTE — Assessment & Plan Note (Signed)
He's maintaining normal sinus rhythm

## 2013-10-04 NOTE — Patient Instructions (Signed)
You are doing well  Please cut the lasix/furosemide in 1/2 every other day  Please take metoprolol 1/2 pill in twice a day  Please call us if you have new issues that need to be addressed before your next appt.  Your physician wants you to follow-up in: 6 months.  You will receive a reminder letter in the mail two months in advance. If you don't receive a letter, please call our office to schedule the follow-up appointment.

## 2013-10-04 NOTE — Telephone Encounter (Signed)
Tiney Rouge from PT at [facility] called stating that pt presented today w/ BP 132/78, with exercise 122/66, states that it kept dropping down to 107/60. Reports that pt took his lasix this am, had coffee and OJ.  Reports sharp chest pain that was brief in duration.  States this is a new problem of BP dropping w/ exercise and would like for pt to be seen today. Pt sched to see Dr. Mariah Milling today at 3:30.

## 2013-10-18 ENCOUNTER — Encounter: Payer: Self-pay | Admitting: Thoracic Surgery (Cardiothoracic Vascular Surgery)

## 2013-10-22 ENCOUNTER — Other Ambulatory Visit: Payer: Self-pay | Admitting: Physician Assistant

## 2013-10-23 ENCOUNTER — Other Ambulatory Visit: Payer: Self-pay

## 2013-10-23 MED ORDER — AMIODARONE HCL 200 MG PO TABS: 200.0000 mg | ORAL_TABLET | Freq: Every day | ORAL | Status: DC

## 2013-10-23 NOTE — Telephone Encounter (Signed)
Requested Prescriptions   Signed Prescriptions Disp Refills  . amiodarone (PACERONE) 200 MG tablet 30 tablet 3    Sig: Take 1 tablet (200 mg total) by mouth daily.    Authorizing Provider: Minna Merritts    Ordering User: Britt Bottom

## 2013-11-08 ENCOUNTER — Other Ambulatory Visit: Payer: Self-pay | Admitting: Physician Assistant

## 2013-11-09 ENCOUNTER — Other Ambulatory Visit: Payer: Self-pay

## 2013-11-09 MED ORDER — METOPROLOL TARTRATE 25 MG PO TABS: 12.5000 mg | ORAL_TABLET | Freq: Two times a day (BID) | ORAL | Status: DC

## 2013-11-09 NOTE — Telephone Encounter (Signed)
Requested Prescriptions   Signed Prescriptions Disp Refills  . metoprolol tartrate (LOPRESSOR) 25 MG tablet 30 tablet 3    Sig: Take 0.5 tablets (12.5 mg total) by mouth 2 (two) times daily.    Authorizing Provider: Minna Merritts    Ordering User: Britt Bottom

## 2013-11-15 ENCOUNTER — Other Ambulatory Visit: Payer: Self-pay | Admitting: Physician Assistant

## 2013-11-16 ENCOUNTER — Ambulatory Visit: Payer: Self-pay | Admitting: Urology

## 2013-11-18 ENCOUNTER — Encounter: Payer: Self-pay | Admitting: Thoracic Surgery (Cardiothoracic Vascular Surgery)

## 2013-12-03 ENCOUNTER — Ambulatory Visit (INDEPENDENT_AMBULATORY_CARE_PROVIDER_SITE_OTHER): Payer: Medicare Other | Admitting: Cardiovascular Disease

## 2013-12-03 ENCOUNTER — Encounter: Payer: Self-pay | Admitting: Cardiovascular Disease

## 2013-12-03 VITALS — BP 100/80 | HR 51 | Ht 69.0 in | Wt 156.2 lb

## 2013-12-03 DIAGNOSIS — I951 Orthostatic hypotension: Secondary | ICD-10-CM

## 2013-12-03 DIAGNOSIS — I4891 Unspecified atrial fibrillation: Secondary | ICD-10-CM

## 2013-12-03 DIAGNOSIS — F17200 Nicotine dependence, unspecified, uncomplicated: Secondary | ICD-10-CM

## 2013-12-03 DIAGNOSIS — I739 Peripheral vascular disease, unspecified: Secondary | ICD-10-CM

## 2013-12-03 DIAGNOSIS — E785 Hyperlipidemia, unspecified: Secondary | ICD-10-CM

## 2013-12-03 DIAGNOSIS — I2510 Atherosclerotic heart disease of native coronary artery without angina pectoris: Secondary | ICD-10-CM

## 2013-12-03 MED ORDER — METOPROLOL TARTRATE 25 MG PO TABS: 12.5000 mg | ORAL_TABLET | Freq: Two times a day (BID) | ORAL | Status: DC

## 2013-12-03 MED ORDER — POTASSIUM CHLORIDE ER 10 MEQ PO TBCR: EXTENDED_RELEASE_TABLET | ORAL | Status: DC

## 2013-12-03 MED ORDER — NITROGLYCERIN 0.4 MG SL SUBL: 0.4000 mg | SUBLINGUAL_TABLET | SUBLINGUAL | Status: DC | PRN

## 2013-12-03 MED ORDER — PRAVASTATIN SODIUM 40 MG PO TABS: 40.0000 mg | ORAL_TABLET | Freq: Every day | ORAL | Status: DC

## 2013-12-03 MED ORDER — LISINOPRIL 5 MG PO TABS: ORAL_TABLET | ORAL | Status: DC

## 2013-12-03 NOTE — Assessment & Plan Note (Signed)
Recommended that he stay on his statin, goal LDL less than 70

## 2013-12-03 NOTE — Assessment & Plan Note (Signed)
Recommended that we continue aggressive cholesterol management. Goal LDL less than 70, smoking cessation

## 2013-12-03 NOTE — Assessment & Plan Note (Signed)
Blood pressure is low but holding. Community call our office for symptoms of dizziness.

## 2013-12-03 NOTE — Assessment & Plan Note (Signed)
Heart rate is low, maintaining normal sinus rhythm. We will hold his amiodarone, continue his low-dose metoprolol

## 2013-12-03 NOTE — Patient Instructions (Addendum)
You are doing well.  Please hold the amiodarone pill Only take potassium when you take lasix/furosemide  Please call us if you have new issues that need to be addressed before your next appt.  Your physician wants you to follow-up in: 6 months.  You will receive a reminder letter in the mail two months in advance. If you don't receive a letter, please call our office to schedule the follow-up appointment.

## 2013-12-03 NOTE — Progress Notes (Signed)
Patient ID: Jerry Davenport, male    DOB: 06-Mar-1941, 72 y.o.   MRN: [id number]  HPI Comments: Mr. 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 Ronalee Belts in [place], who works as a Geophysicist/field seismologist, significant home stress (issues with his son ), patient of Dr. Luan Pulling   who presents for routine followup.  CABG Surgery was October 2014 : left internal mammary artery to the left anterior descending, saphenous vein graft to obtuse marginal 2, saphenous vein graft to posterior descending   postoperative atrial fibrillation treated with amiodarone. lost significant weight,  20 pound weight loss. He was admitted to the hospital 08/24/2013 for recurrent atrial fibrillation, shortness of breath. With medical management, he converted back to normal sinus rhythm.discharged on 08/26/2013 Chest x-ray in the hospital 08/24/2013 showed small bilateral pleural effusions left lower lobe atelectasis Echocardiogram in the hospital showed ejection fraction 40-45%, mild LVH, mildly dilated left atrium, mild to moderate  In followup today, he reports that he is doing well. Denies any tachycardia or palpitations. He has not been taking any diuretic for at least over one month with no significant lower extremity edema. He reports that he is able to "out walk" his wife now with no significant shortness of breath. Some numbness around the mediastinal site. Minimal pain the left lower extremity at the vein donor site. No significant cough, no orthopnea or PND. He is indicated that he would like to go back to work as he feels very good   Previous cardiac catheterization  confirmed severe three-vessel disease. He had occluded mid RCA, occluded mid left circumflex, patent ramus and patent LAD with severe stenotic lesion in the proximal to mid LAD  estimated at 80%.  collaterals extending to the distal RCA and left circumflex.   EKG shows normal sinus rhythm with rate 51 beats per minute, nonspecific ST abnormality in V3-V6, T wave abnormality 2, 3, aVF    Outpatient Encounter Prescriptions as of 12/03/2013  Medication Sig  . acetaminophen (TYLENOL) 500 MG tablet Take 1,000 mg by mouth every 6 (six) hours as needed for pain.   Marland Kitchen amiodarone (PACERONE) 200 MG tablet Take 1 tablet (200 mg total) by mouth daily.  Marland Kitchen aspirin 162 MG EC tablet Take 162 mg by mouth daily.  Marland Kitchen docusate sodium (COLACE) 100 MG capsule Take 100 mg by mouth daily.  Marland Kitchen ENSURE PLUS (ENSURE PLUS) LIQD Take 1 Can by mouth daily.  Marland Kitchen KLOR-CON 10 10 MEQ tablet 1 TABLET BY MOUTH EVERY DAY-DO NOT CRUSH  . lisinopril (PRINIVIL,ZESTRIL) 5 MG tablet Take half tablet daily.  Marland Kitchen loratadine (CLARITIN) 10 MG tablet Take 10 mg by mouth daily.  . metoprolol tartrate (LOPRESSOR) 25 MG tablet Take 0.5 tablets (12.5 mg total) by mouth 2 (two) times daily.  Marland Kitchen NITROSTAT 0.4 MG SL tablet Place 0.4 mg under the tongue every 5 (five) minutes as needed.   Marland Kitchen oxyCODONE (OXY IR/ROXICODONE) 5 MG immediate release tablet Take 1 tablet (5 mg total) by mouth every 4 (four) hours as needed for pain.  . polyethylene glycol powder (GLYCOLAX/MIRALAX) powder Take 1 Container by mouth as needed.   . pravastatin (PRAVACHOL) 40 MG tablet Take 40 mg by mouth daily with supper.     Review of Systems  Constitutional: Negative.   HENT: Negative.   Eyes: Negative.  Respiratory: Negative.   Cardiovascular: Negative.   Gastrointestinal: Negative.   Endocrine: Negative.   Musculoskeletal: Negative.   Skin: Negative.   Allergic/Immunologic: Negative.   Neurological: Negative.   Hematological: Negative.   Psychiatric/Behavioral: Negative.   All other systems reviewed and are negative.    BP 100/80  Pulse 51  Ht 5\' 9"  (1.753 m)  Wt 156 lb 4 oz (70.875 kg)  BMI 23.06 kg/m2  Physical Exam  Nursing  note and vitals reviewed. Constitutional: He is oriented to person, place, and time. He appears well-developed and well-nourished.  HENT:  Head: Normocephalic.  Nose: Nose normal.  Mouth/Throat: Oropharynx is clear and moist.  Eyes: Conjunctivae are normal. Pupils are equal, round, and reactive to light.  Neck: Normal range of motion. Neck supple. No JVD present.  Cardiovascular: Normal rate, regular rhythm, S1 normal, S2 normal, normal heart sounds and intact distal pulses.  Exam reveals no gallop and no friction rub.   No murmur heard. Pulses:      Dorsalis pedis pulses are 0 on the right side, and 0 on the left side.       Posterior tibial pulses are 0 on the right side, and 0 on the left side.  Pulmonary/Chest: Effort normal. No respiratory distress. He has decreased breath sounds. He has no wheezes. He has no rales. He exhibits no tenderness.  Abdominal: Soft. Bowel sounds are normal. He exhibits no distension. There is no tenderness.  Musculoskeletal: Normal range of motion. He exhibits no edema and no tenderness.  Lymphadenopathy:    He has no cervical adenopathy.  Neurological: He is alert and oriented to person, place, and time. Coordination normal.  Skin: Skin is warm and dry. No rash noted. No erythema.  Psychiatric: He has a normal mood and affect. His behavior is normal. Judgment and thought content normal.      Assessment and Plan

## 2013-12-03 NOTE — Assessment & Plan Note (Signed)
Complemented him on smoking cessation

## 2013-12-03 NOTE — Assessment & Plan Note (Signed)
Currently with no symptoms of angina. No further workup at this time. Continue current medication regimen. 

## 2014-03-04 ENCOUNTER — Ambulatory Visit: Payer: Self-pay | Admitting: Family Medicine

## 2014-03-21 ENCOUNTER — Ambulatory Visit: Payer: Self-pay | Admitting: Cardiothoracic Surgery

## 2014-04-17 ENCOUNTER — Ambulatory Visit: Payer: Self-pay | Admitting: Cardiothoracic Surgery

## 2014-06-17 ENCOUNTER — Ambulatory Visit: Payer: Self-pay | Admitting: Family Medicine

## 2014-07-31 ENCOUNTER — Ambulatory Visit: Payer: Medicare Other | Admitting: Cardiovascular Disease

## 2014-12-09 ENCOUNTER — Emergency Department: Payer: Self-pay | Admitting: Emergency Medicine

## 2014-12-10 ENCOUNTER — Encounter: Payer: Self-pay | Admitting: Cardiovascular Disease

## 2014-12-10 ENCOUNTER — Ambulatory Visit (INDEPENDENT_AMBULATORY_CARE_PROVIDER_SITE_OTHER): Payer: Commercial Managed Care - HMO | Admitting: Cardiovascular Disease

## 2014-12-10 VITALS — BP 128/62 | HR 62 | Ht 67.0 in | Wt 170.5 lb

## 2014-12-10 DIAGNOSIS — E785 Hyperlipidemia, unspecified: Secondary | ICD-10-CM

## 2014-12-10 DIAGNOSIS — I4891 Unspecified atrial fibrillation: Secondary | ICD-10-CM

## 2014-12-10 DIAGNOSIS — R079 Chest pain, unspecified: Secondary | ICD-10-CM

## 2014-12-10 DIAGNOSIS — I4892 Unspecified atrial flutter: Secondary | ICD-10-CM | POA: Insufficient documentation

## 2014-12-10 DIAGNOSIS — I739 Peripheral vascular disease, unspecified: Secondary | ICD-10-CM

## 2014-12-10 DIAGNOSIS — Z951 Presence of aortocoronary bypass graft: Secondary | ICD-10-CM

## 2014-12-10 MED ORDER — AMIODARONE HCL 200 MG PO TABS: 200.0000 mg | ORAL_TABLET | Freq: Two times a day (BID) | ORAL | Status: AC | PRN

## 2014-12-10 MED ORDER — NITROGLYCERIN 0.4 MG SL SUBL: 0.4000 mg | SUBLINGUAL_TABLET | SUBLINGUAL | Status: DC | PRN

## 2014-12-10 MED ORDER — METOPROLOL TARTRATE 25 MG PO TABS: 25.0000 mg | ORAL_TABLET | Freq: Two times a day (BID) | ORAL | Status: DC

## 2014-12-10 NOTE — Progress Notes (Signed)
Patient ID: Jerry Davenport, male    DOB: Jul 05, 1941, 74 y.o.   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 Ronalee Belts in [place], who works as a Geophysicist/field seismologist, significant home stress (issues with his son ), patient of Dr. Luan Pulling   who presents for routine followup for atrial flutter   He reports that he was walking to get breakfast yesterday out at a restaurant when he developed acute onset of shortness of breath, tachycardia, lightheadedness. He went to the emergency room and was found to be in atrial flutter with ventricular rate 147 bpm. He states that he was not given any medications and shortly after converted back to normal sinus rhythm He was given Z-Pak for suspicion of bronchitis or early pneumonia. Chest x-ray showed chronic lung disease with emphysema, increased patchy opacity at the left lung base concerning for infection He denies any further episodes of tachycardia or lightheadedness though his wife does report that she touched his chest in the middle of the night and it may have been going fast for just a brief second. She listens to his breathing to see if it is erratic. He denies any significant symptoms of shortness of breath or chest pain concerning for angina Still has mild numbness in his chest  EKG on today's visit shows normal sinus rhythm with rate 63 bpm, T-wave abnormality in the anterolateral leads, inferior leads   CABG Surgery was October 2014 : left internal mammary artery to the left anterior descending, saphenous vein graft to obtuse marginal 2, saphenous vein graft to posterior descending   postoperative atrial fibrillation treated with amiodarone. lost significant weight,  20 pound weight loss. He was admitted to the hospital 08/24/2013 for  recurrent atrial fibrillation, shortness of breath. With medical management, he converted back to normal sinus rhythm.discharged on 08/26/2013 Chest x-ray in the hospital 08/24/2013 showed small bilateral pleural effusions left lower lobe atelectasis Echocardiogram in the hospital showed ejection fraction 40-45%, mild LVH, mildly dilated left atrium, mild to moderate   Previous cardiac catheterization  confirmed severe three-vessel disease. He had occluded mid RCA, occluded mid left circumflex, patent ramus and patent LAD with severe stenotic lesion in the proximal to mid LAD estimated at 80%.  collaterals extending to the distal RCA and left circumflex.    No Known Allergies  Outpatient Encounter Prescriptions as of 12/10/2014  Medication Sig  . amiodarone (PACERONE) 200 MG tablet Take 1 tablet (200 mg total) by mouth 2 (two) times daily as needed.  Marland Kitchen aspirin 162 MG EC tablet Take 162 mg by mouth daily.  Marland Kitchen azithromycin (ZITHROMAX) 250 MG tablet Take 250 mg by mouth daily.  . metoprolol tartrate (LOPRESSOR) 25 MG tablet Take 1 tablet (25 mg total) by mouth 2 (two) times daily.  . nitroGLYCERIN (NITROSTAT) 0.4 MG SL tablet Place 1 tablet (0.4 mg total) under the tongue every 5 (five) minutes as needed.  . pravastatin (PRAVACHOL) 40 MG tablet Take 1 tablet (40 mg total) by mouth daily with supper.  . [DISCONTINUED] acetaminophen (TYLENOL) 500 MG tablet Take 1,000 mg by mouth every 6 (six) hours as needed for pain.   . [DISCONTINUED] docusate sodium (COLACE) 100 MG capsule Take 100 mg by mouth daily.  . [DISCONTINUED] ENSURE PLUS (ENSURE PLUS) LIQD Take 1 Can by  mouth daily.  . [DISCONTINUED] lisinopril (PRINIVIL,ZESTRIL) 5 MG tablet Take half tablet daily. (Patient not taking: Reported on 12/10/2014)  . [DISCONTINUED] loratadine (CLARITIN) 10 MG tablet Take 10 mg by mouth daily.  . [DISCONTINUED] metoprolol tartrate (LOPRESSOR) 25 MG tablet Take 0.5 tablets (12.5 mg total) by mouth 2 (two) times  daily. (Patient not taking: Reported on 12/10/2014)  . [DISCONTINUED] metoprolol tartrate (LOPRESSOR) 25 MG tablet Take 25 mg by mouth 2 (two) times daily.   . [DISCONTINUED] nitroGLYCERIN (NITROSTAT) 0.4 MG SL tablet Place 1 tablet (0.4 mg total) under the tongue every 5 (five) minutes as needed.  . [DISCONTINUED] oxyCODONE (OXY IR/ROXICODONE) 5 MG immediate release tablet Take 1 tablet (5 mg total) by mouth every 4 (four) hours as needed for pain. (Patient not taking: Reported on 12/10/2014)  . [DISCONTINUED] polyethylene glycol powder (GLYCOLAX/MIRALAX) powder Take 1 Container by mouth as needed.   . [DISCONTINUED] potassium chloride (KLOR-CON 10) 10 MEQ tablet 1 TABLET BY MOUTH EVERY DAY-DO NOT CRUSH (Patient not taking: Reported on 12/10/2014)    Past Medical History  Diagnosis Date  . Hyposmolality and/or hyponatremia   . Other and unspecified hyperlipidemia   . Headache(784.0)   . Backache, unspecified   . Allergic rhinitis, cause unspecified   . Tobacco use disorder   . Other constipation   . Coronary artery disease   . Myocardial infarction   . Hypertension   . Shortness of breath     with exertion, pt. remarks that he "gives out more from his legs than his breathing"   . Stroke     [facility]- pt. told he had a stroke, but he doesn't think he did   . Peripheral vascular disease, unspecified   . GERD (gastroesophageal reflux disease)     indigestion on occasion   . Arthritis     back, legs, knees   . A-fib   . Kidney stones     right kidney    Past Surgical History  Procedure Laterality Date  . Hemorroidectomy    . Cardiac catheterization  2014    [facility]  . Knee surgery  1990's    [facility]- Left knee, cartilage removed   . Multiple tooth extractions  1980's  . Coronary artery bypass graft N/A 08/13/2013    Procedure: CORONARY ARTERY BYPASS GRAFTING (CABG);  Surgeon: Melrose Nakayama, MD;  Location: [facility];  Service: Open Heart Surgery;  Laterality: N/A;  CABG x three,  using  left internal mammary artery and right leg greater saphenous vein harvested endoscopically    Social History  reports that he quit smoking about a year ago. His smoking use included Cigarettes. He has a 15 pack-year smoking history. He has never used smokeless tobacco. He reports that he does not drink alcohol or use illicit drugs.  Family History family history includes Hypertension in his mother.        Review of Systems  Constitutional: Negative.   Respiratory: Negative.   Cardiovascular: Negative.        Tachycardia  Gastrointestinal: Negative.   Musculoskeletal: Negative.   Skin: Negative.   Neurological: Positive for dizziness.  Hematological: Negative.   Psychiatric/Behavioral: Negative.   All other systems reviewed and are negative.   BP 128/62 mmHg  Pulse 62  Ht 5\' 7"  (1.702 m)  Wt 170 lb 8 oz (77.338 kg)  BMI 26.70 kg/m2  Physical Exam  Constitutional: He is oriented to person, place, and time. He appears well-developed and well-nourished.  HENT:  Head:  Normocephalic.  Nose: Nose normal.  Mouth/Throat: Oropharynx is clear and moist.  Eyes: Conjunctivae are normal. Pupils are equal, round, and reactive to light.  Neck: Normal range of motion. Neck supple. No JVD present.  Cardiovascular: Normal rate, regular rhythm, S1 normal, S2 normal, normal heart sounds and intact distal pulses.  Exam reveals no gallop and no friction rub.   No murmur heard. Pulses:      Dorsalis pedis pulses are 0 on the right side, and 0 on the left side.       Posterior tibial pulses are 0 on the right side, and 0 on the left side.  Pulmonary/Chest: Effort normal. No respiratory distress. He has decreased breath sounds. He has no wheezes. He has no rales. He exhibits no tenderness.  Abdominal: Soft. Bowel sounds are normal. He exhibits no distension. There is no tenderness.  Musculoskeletal: Normal range of motion. He exhibits no edema or tenderness.  Lymphadenopathy:    He has no  cervical adenopathy.  Neurological: He is alert and oriented to person, place, and time. Coordination normal.  Skin: Skin is warm and dry. No rash noted. No erythema.  Psychiatric: He has a normal mood and affect. His behavior is normal. Judgment and thought content normal.      Assessment and Plan   Nursing note and vitals reviewed.

## 2014-12-10 NOTE — Assessment & Plan Note (Signed)
Currently with no symptoms of angina. No further workup at this time. Continue current medication regimen. 

## 2014-12-10 NOTE — Assessment & Plan Note (Signed)
Followed by vascular surgery. 

## 2014-12-10 NOTE — Assessment & Plan Note (Signed)
Encouraged him to stay on his pravastatin

## 2014-12-10 NOTE — Patient Instructions (Addendum)
You are doing well.  If you go into tachycardia, Take amiodarone 2 pills with metoprolol (extra one) Lay down No nitro Call the office  Only take nitro for chest pain/angina  We will order a 30 day monitor for atrial flutter  Please call us if you have new issues that need to be addressed before your next appt.  Your physician wants you to follow-up in: 6 months.  You will receive a reminder letter in the mail two months in advance. If you don't receive a letter, please call our office to schedule the follow-up appointment.

## 2014-12-10 NOTE — Assessment & Plan Note (Addendum)
Brief episode of atrial flutter. Last episode of atrial fibrillation in 2014. Unclear if he is having paroxysmal arrhythmia. 30 day monitor has been ordered to help guide medical management. If he has asymptomatic arrhythmia, with need aggressive rhythm control medications, even anticoagulation. Metoprolol increased up to 25 mg twice a day. We have given him amiodarone to take as needed for tachycardia episodes in an effort to restore normal sinus rhythm.

## 2014-12-11 ENCOUNTER — Telehealth: Payer: Self-pay | Admitting: Cardiovascular Disease

## 2014-12-11 NOTE — Telephone Encounter (Signed)
Spoke w/ pt.  He states that there is no need to call his wife, as he knows what all of his meds are for and that he does not know why she would be calling.

## 2014-12-11 NOTE — Telephone Encounter (Signed)
Patient's wife has medication questions.  Wants to know what the medications husband is taking are for.  Pravastatin and another one that is green.  Please call wife.

## 2014-12-12 DIAGNOSIS — R000 Tachycardia, unspecified: Secondary | ICD-10-CM

## 2014-12-15 ENCOUNTER — Ambulatory Visit: Payer: Self-pay | Admitting: Physician Assistant

## 2014-12-27 ENCOUNTER — Telehealth: Payer: Self-pay

## 2014-12-27 NOTE — Telephone Encounter (Signed)
Pt wife called, states pt has worn monitor since march 1, and has changed the "pads" out every 3 days, states his skin very irritated and sore,like little "pimples" states he can't hardly stand to take a shower. He has been using the sensitive skin pads. Please call.

## 2014-12-30 NOTE — Telephone Encounter (Signed)
Spoke w/ pt's wife.  She reports that pt had the shingles and the electrodes were causing a great deal of pain. She reports that pt's granddaughter told him to stop wearing the monitor, so he has not worn it over the weekend.  She will pack the monitor up and send it back to [facility].

## 2015-01-15 ENCOUNTER — Ambulatory Visit
Admit: 2015-01-15 | Disposition: A | Discharge status: Home / Self Care | Payer: Self-pay | Attending: Family Medicine | Admitting: Family Medicine

## 2015-01-16 ENCOUNTER — Ambulatory Visit
Admit: 2015-01-16 | Disposition: A | Discharge status: Home / Self Care | Payer: Self-pay | Attending: Cardiothoracic Surgery | Admitting: Cardiothoracic Surgery

## 2015-01-17 ENCOUNTER — Ambulatory Visit
Admit: 2015-01-17 | Disposition: A | Discharge status: Home / Self Care | Payer: Self-pay | Attending: Cardiothoracic Surgery | Admitting: Cardiothoracic Surgery

## 2015-01-20 ENCOUNTER — Telehealth: Payer: Self-pay

## 2015-01-20 NOTE — Telephone Encounter (Signed)
Reviewed results of 30 day event monitor w/ pt's wife:  "NSR w/ no significant arrythmias".  She verbalizes understanding and will call back w/ any questions or concerns.  She reports that pt's PCP stopped his pravastatin, though she is unsure why. Reports that she did not read instructions on pt's amiodarone and was giving this to pt BID instead of prn.  She has now run out of this med and wants to know if it should be refilled.  Advised her to refill med and keep in on hand as instructed. She is agreeable to this and voices frustration that the med labels do not tell what each med is to be taken for, as she gets confused w/ all her meds and pt's meds. Asked her to call back if we can be of further assistance.

## 2015-01-21 ENCOUNTER — Ambulatory Visit (INDEPENDENT_AMBULATORY_CARE_PROVIDER_SITE_OTHER): Payer: Commercial Managed Care - HMO | Admitting: *Deleted

## 2015-01-21 ENCOUNTER — Other Ambulatory Visit: Payer: Self-pay

## 2015-01-21 DIAGNOSIS — R079 Chest pain, unspecified: Secondary | ICD-10-CM

## 2015-01-21 DIAGNOSIS — I4891 Unspecified atrial fibrillation: Secondary | ICD-10-CM

## 2015-01-22 ENCOUNTER — Ambulatory Visit
Admit: 2015-01-22 | Disposition: A | Discharge status: Home / Self Care | Payer: Self-pay | Attending: Cardiothoracic Surgery | Admitting: Cardiothoracic Surgery

## 2015-01-28 ENCOUNTER — Ambulatory Visit
Admit: 2015-01-28 | Disposition: A | Discharge status: Home / Self Care | Payer: Self-pay | Attending: Cardiothoracic Surgery | Admitting: Cardiothoracic Surgery

## 2015-01-28 LAB — CBC WITH DIFFERENTIAL/PLATELET
Basophil #: 0.1 10*3/uL (ref 0.0–0.1)
Basophil %: 1.8 %
EOS ABS: 0.4 10*3/uL (ref 0.0–0.7)
Eosinophil %: 6.5 %
HCT: 38.3 % — ABNORMAL LOW (ref 40.0–52.0)
HGB: 12.9 g/dL — ABNORMAL LOW (ref 13.0–18.0)
LYMPHS PCT: 18.3 %
Lymphocyte #: 1.2 10*3/uL (ref 1.0–3.6)
MCH: 38.3 pg — ABNORMAL HIGH (ref 26.0–34.0)
MCHC: 33.7 g/dL (ref 32.0–36.0)
MCV: 114 fL — AB (ref 80–100)
Monocyte #: 0.3 x10 3/mm (ref 0.2–1.0)
Monocyte %: 4.8 %
NEUTROS PCT: 68.6 %
Neutrophil #: 4.6 10*3/uL (ref 1.4–6.5)
Platelet: 147 10*3/uL — ABNORMAL LOW (ref 150–440)
RBC: 3.37 10*6/uL — ABNORMAL LOW (ref 4.40–5.90)
RDW: 15.2 % — AB (ref 11.5–14.5)
WBC: 6.7 10*3/uL (ref 3.8–10.6)

## 2015-01-28 LAB — PROTIME-INR
INR: 1
Prothrombin Time: 13.4 secs

## 2015-02-03 ENCOUNTER — Emergency Department
Admit: 2015-02-03 | Disposition: A | Discharge status: Home / Self Care | Payer: Self-pay | Admitting: Emergency Medicine

## 2015-02-07 NOTE — Consult Note (Signed)
General Aspect Jerry Davenport is a 74 year old gentleman who recently had to undergo a DOT physical.  He went to Dr. Luan Pulling and as part of his evaluation had an EKG. 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  Due to this finding he was referred to Dr. Rockey Situ and a cardiac catheterization. That revealed severe three-vessel coronary disease with an 80% LAD stenosis and total occlusion of the right coronary and left circumflex, which filled via collaterals from the LAD. Left ventriculogram showed inferior akinesis with an EF of 40-45%.  The patient was subsequently referred to Dr. Roxan Hockey who performed CABG on 10/27. Post op was complicated by A-fib with RVR. he was started on Amiodarone. He was discharged home on 11/3 with a 5 day course of lasix 40 mg once daily.  He presented yesturday with weakness, shortness of breath, orthopnea and constipation. In the ED , he was noted to be in A-fib with RVR and mildly fluid overaloaded. He was given IV lasix. He had an enema.  He comverted to NSR overnight. He is feeling significantly better.   Physical Exam:  GEN no acute distress   NECK supple  mild JVD   RESP crackles  at base   CARD Regular rate and rhythm  No murmur   ABD denies tenderness   LYMPH negative neck   EXTR negative cyanosis/clubbing, negative edema   SKIN normal to palpation   PSYCH alert, A+O to time, place, person   Review of Systems:  Subjective/Chief Complaint shortness of breath   General: Fatigue   Skin: No Complaints   ENT: No Complaints   Eyes: No Complaints   Neck: No Complaints   Respiratory: Short of breath   Cardiovascular: Palpitations  Dyspnea  Orthopnea   Gastrointestinal: Constipation   Genitourinary: No Complaints   Vascular: No Complaints   Musculoskeletal: No Complaints   Neurologic: No Complaints   Hematologic: No Complaints   Endocrine: No Complaints   Psychiatric: No Complaints   Review of Systems: All other systems were reviewed and  found to be negative     Hypercholesterolemia:    HTN:    hyponatremia:    hyposmolarity:    allergic rhinitis:    PVD - Peripheral Vascular Disease:    stroke:    Tobacco abuse:    CABG (Coronary Artery Bypass Graft):   Home Medications: Medication Instructions Status  potassium chloride 10 mEq oral tablet, extended release 1 tab(s) orally once a day x 5 days. *do not crush* Active  furosemide 40 mg oral tablet 1 tab(s) orally once a day x 5 days. Active  pravastatin 40 mg oral tablet 1 tab(s) orally once a day (in the evening) Active  Nitrostat 0.4 mg sublingual tablet 1 tab(s) sublingual every 5 minutes up to 3 doses as needed for chest pain.  *If no relief call MD or go to Emergency room*. Active  oxyCODONE 5 mg oral tablet 1 tab(s) orally every 4 hours as needed for pain. Active  acetaminophen 500 mg oral tablet 1 tab(s) orally every 6 hours as needed for back pain. Active  aspirin 325 mg oral tablet 1 tab(s) orally once a day Active  Metoprolol Tartrate 25 mg oral tablet 1 tab(s) orally 2 times a day Active  amiodarone 200 mg oral tablet 1 tab(s) orally 2 times a day Active  lisinopril 5 mg oral tablet 1 tab(s) orally once a day Active   Lab Results: Hepatic:  07-Nov-14 16:01   Bilirubin, Total 0.9  Alkaline  Phosphatase 110  SGPT (ALT) 18  SGOT (AST) 19  Total Protein, Serum 7.2  Albumin, Serum 3.6  Routine Chem:  07-Nov-14 16:01   Glucose, Serum  111  BUN 18  Creatinine (comp) 1.20  Sodium, Serum  131  Potassium, Serum 4.5  Chloride, Serum  97  CO2, Serum 29  Calcium (Total), Serum 9.0  Osmolality (calc) 265  eGFR (African American) >60  eGFR (Non-African American)  60 (eGFR values <56m/min/1.73 m2 may be an indication of chronic kidney disease (CKD). Calculated eGFR is useful in patients with stable renal function. The eGFR calculation will not be reliable in acutely ill patients when serum creatinine is changing rapidly. It is not useful in   patients on dialysis. The eGFR calculation may not be applicable to patients at the low and high extremes of body sizes, pregnant women, and vegetarians.)  Anion Gap  5  B-Type Natriuretic Peptide ([employer]  4627 (Result(s) reported on 24 Aug 2013 at 05:01PM.)  Cardiac:  07-Nov-14 16:01   CK, Total  25  CPK-MB, Serum 1.1 (Result(s) reported on 24 Aug 2013 at 05:01PM.)  Troponin I < 0.02 (0.00-0.05 0.05 ng/mL or less: NEGATIVE  Repeat testing in 3-6 hrs  if clinically indicated. >0.05 ng/mL: POTENTIAL  MYOCARDIAL INJURY. Repeat  testing in 3-6 hrs if  clinically indicated. NOTE: An increase or decrease  of 30% or more on serial  testing suggests a  clinically important change)    22:30   CK, Total  20  CPK-MB, Serum 1.2 (Result(s) reported on 24 Aug 2013 at 11:06PM.)  Troponin I < 0.02 (0.00-0.05 0.05 ng/mL or less: NEGATIVE  Repeat testing in 3-6 hrs  if clinically indicated. >0.05 ng/mL: POTENTIAL  MYOCARDIAL INJURY. Repeat  testing in 3-6 hrs if  clinically indicated. NOTE: An increase or decrease  of 30% or more on serial  testing suggests a  clinically important change)  Routine UA:  07-Nov-14 19:54   Color (UA) Yellow  Clarity (UA) Clear  Glucose (UA) Negative  Bilirubin (UA) Negative  Ketones (UA) Negative  Specific Gravity (UA) 1.006  Blood (UA) Negative  pH (UA) 7.0  Protein (UA) Negative  Nitrite (UA) Negative  Leukocyte Esterase (UA) Negative (Result(s) reported on 24 Aug 2013 at 08:20PM.)  RBC (UA) 1 /HPF  WBC (UA) 2 /HPF  Bacteria (UA) NONE SEEN  Epithelial Cells (UA) 1 /HPF  Mucous (UA) PRESENT  Hyaline Cast (UA) 6 /LPF (Result(s) reported on 24 Aug 2013 at 08:20PM.)  Routine Coag:  07-Nov-14 16:01   Prothrombin 14.4  INR 1.1 (INR reference interval applies to patients on anticoagulant therapy. A single INR therapeutic range for coumarins is not optimal for all indications; however, the suggested range for most indications is 2.0 -  3.0. Exceptions to the INR Reference Range may include: Prosthetic heart valves, acute myocardial infarction, prevention of myocardial infarction, and combinations of aspirin and anticoagulant. The need for a higher or lower target INR must be assessed individually. Reference: The Pharmacology and Management of the Vitamin K  antagonists: the seventh ACCP Conference on Antithrombotic and Thrombolytic Therapy. CTIRWE.3154Sept:126 (3suppl): [id number] A HCT value >55% may artifactually increase the PT.  In one study,  the increase was an average of 25%. Reference:  "Effect on Routine and Special Coagulation Testing Values of Citrate Anticoagulant Adjustment in Patients with High HCT Values." American Journal of Clinical Pathology 2006;126:400-405.)  Activated PTT (APTT)  36.1 (A HCT value >55% may artifactually increase the APTT. In one study,  the increase was an average of 19%. Reference: "Effect on Routine and Special Coagulation Testing Values of Citrate Anticoagulant Adjustment in Patients with High HCT Values." American Journal of Clinical Pathology 2006;126:400-405.)  Routine Hem:  07-Nov-14 16:01   WBC (CBC) 7.1  RBC (CBC)  3.29  Hemoglobin (CBC)  12.7  Hematocrit (CBC)  36.2  Platelet Count (CBC) 177 (Result(s) reported on 24 Aug 2013 at 04:35PM.)  MCV  110  MCH  38.7  MCHC 35.1  RDW  19.7   EKG:  Interpretation A-fib with RVR converted to NSR   Radiology Results: XRay:    07-Nov-14 16:22, Chest Portable Single View  Chest Portable Single View   REASON FOR EXAM:    Chest Pain  COMMENTS:       PROCEDURE: DXR - DXR PORTABLE CHEST SINGLE VIEW  - Aug 24 2013  4:22PM     CLINICAL DATA:  Recent cardiac surgery, chest pain    EXAM:  PORTABLE CHEST - 1 VIEW    COMPARISON:  03/07/2013    FINDINGS:  Heart size and vascular pattern are normal. Patient is status post  CABG. On the left, there is hazy attenuation over the lower lobe  with blunting of the costophrenic  angle. This was not present on the  prior study. There is minimal hazy density at the right lung base.     IMPRESSION:  Findings most consistent with small bilateral pleural effusions.  Associated airspace opacity left lower lobe likely passive  atelectasis, although in the appropriate clinical setting pneumonia  could be considered.      Electronically Signed    By: Skipper Cliche M.D.    On: 08/24/2013 16:24       Verified By: Rachael Fee, M.D.,    No Known Allergies:   Vital Signs/Nurse's Notes: **Vital Signs.:   08-Nov-14 08:19  Vital Signs Type Routine  Temperature Temperature (F) 98.1  Celsius 36.7  Temperature Source oral  Pulse Pulse 72  Respirations Respirations 16  Systolic BP Systolic BP 98  Diastolic BP (mmHg) Diastolic BP (mmHg) 65  Mean BP 76  Pulse Ox % Pulse Ox % 98  Pulse Ox Activity Level  At rest  Oxygen Delivery 2L    Impression 1. Acute on chronic CHF post recent CABG:  I will review echo. EF before surgery was 40-45%.  Continue IV lasix today and possible discharge tomorrow if he continues to improve.  Continue small dose Metoprolol and Lisinopril.  He will likely require small dose LAsix upon discharge.   2. A-fib with RVR: this was likely the trigger for CHF. He converted to NSR.  Continue Amiodarone and Metoprolol.  If this becomes a recurrent issue, we might have to consider long term anticoagulation.   3. CAD: s/p recent CABG. No angina. Contineu Aspirin and a statin.   Electronic Signatures: Kathlyn Sacramento (MD)  (Signed [phone] 10:46)  Authored: General Aspect/Present Illness, History and Physical Exam, Review of System, Past Medical History, Home Medications, Labs, EKG , Radiology, Allergies, Vital Signs/Nurse's Notes, Impression/Plan   Last Updated: 08-Nov-14 10:46 by Kathlyn Sacramento (MD)

## 2015-02-07 NOTE — Discharge Summary (Signed)
PATIENT NAME:  Jerry Davenport, Jerry Davenport MR#:  [id number] DATE OF BIRTH:  1940/11/21  DATE OF ADMISSION:  08/24/2013 DATE OF DISCHARGE:  08/26/2013  REASON FOR ADMISSION: Increasing shortness of breath and feeling uncomfortable.   DISCHARGE DIAGNOSES: 1.  Atrial fibrillation with rapid ventricular response.  2.  Congestive heart failure, acute systolic, ejection fraction 40% to 45%. 3.  Hyperlipidemia.  4.  Hypertension.  5.  Peripheral vascular disease.  6.  Tobacco abuse.  7.  Status post CABG on October 27th and discharged from [facility] on November 3rd. 8.  Hyponatremia due to fluid overload.  9.  Mild anemia likely due to blood loss after surgery, now stable at 12.7.  10.  Diastolic dysfunction.  11.  Left ventricular hypertrophy.  12. Aortic valve sclerosis without stenosis.  13.  Chronic respiratory failure.  DISPOSITION: Home with home health, resume previous home health orders, oxygen 2 liters nasal cannula at bedtime.   DISCHARGE MEDICATIONS:  1.  Acetaminophen 500 mg every 6 hours for back pain.  2.  Aspirin 325 mg daily. 3.  Pravastatin 40 mg every evening. 4.  Nitrostat as needed for chest pain. 5.  Oxycodone 5 mg every 4 hours as needed for pain. 6.  Amiodarone 200 mg 2 times daily. 7.  Lisinopril 5 mg once a day. 8.  Metoprolol 25 mg; the patient is going to have a reduced dose of 1/2 tablet or 12.5 mg twice daily. 9.  Furosemide 40 mg once a day. This is a new prescription.  10.  Potassium 10 mEq once daily, also new prescription. 11.  MiraLax 17 grams twice daily for constipation as the patient has been really constipated since his bypass surgery.   DISCHARGE FOLLOWUP: Dr. Ida Rogue, cardiology, in the next 1 to 2 weeks and primary care physician, Dr. Larene Beach, in the next 1 to 2 weeks as well.   HOSPITAL COURSE:  1.  This is a very nice 74 year old gentleman who has history of peripheral vascular disease, coronary artery disease, hypercholesterolemia,  hypertension and current tobacco abuse. He comes complaining of shortness of breath on 08/24/2013. The patient stated that the feeling was worse whenever he was lying down in bed, significant orthopnea for what he started to use the recliner at night. He has been having palpitations and he felt like his heart was racing. He was recently discharged from [facility] on November 3rd after undergoing a coronary artery bypass graft, of 3 vessels, on 08/13/2013. The patient did not have any significant complications, but he developed atrial fibrillation, post CABG, for what he was placed on metoprolol and amiodarone. The patient was given 5 days of Lasix, but after he completed treatment he started to have significant shortness of breath. On admission, the patient was afebrile. His pulse was elevated around 110, occasionally going to 120, blood pressure 198/65 and he was on 2 liters nasal cannula, which is what he is going to be taking right now.  The patient had a chest x-ray that showed CHF exacerbation with mild pulmonary edema. Due to his atrial fibrillation, the patient was placed under telemetry and metoprolol and amiodarone were continued. As far as this problem, the patient is going to be taking aspirin since he has multiple medical problems, including the age of 88, the hypertension and CHF, and he is a candidate for anticoagulation. Dr. Fletcher Anon is going to get Dr. Rockey Situ to work on this. A Xarelto prescription is going to be given outpatient. His atrial fibrillation  was under control. Actually, his heart rate went down to 60s and 50s for what we decided to reduce his dose of metoprolol to half. His blood pressure remained in the low numbers, 90s to 100s, but he was always stable.  2.  As far as his congestive heart failure, the patient was put on Lasix IV twice daily. After he diuresed very well without any major changes in his kidney function, the patient was changed to oral and he is going to be discharged on  40 mg once a day. His left ventriculogram showed inferior akinesis and an ejection fraction of 40% to 45%. Echocardiogram repeated showed the same abnormalities with problem global hypokinesis. The patient was put on a beta blocker and an ACE inhibitor. The patient is tolerating his medications well. 3.  As far as his other medical problems, they were stable. For vascular disease, he needs to continue aspirin. For hypertension, continue lisinopril and metoprolol. For hyperlipidemia, the patient is already on pravastatin. For his tobacco abuse, the patient was extensively counseled about the need of quitting smoking. His wife actually is hiding his cigarettes. He does not want to quit, but his wife is making him. The patient is discharged in good condition.   TIME SPENT ON DISCHARGE: About 45 minutes. ____________________________ Belford Sink, MD rsg:sb D: 08/27/2013 06:53:31 ET T: 08/27/2013 08:51:02 ET JOB#: [id number]  cc: Dunklin Sink, MD, <Dictator> Minna Merritts, MD Arlis Porta., MD Cristi Loron MD ELECTRONICALLY SIGNED 09/05/2013 23:00

## 2015-02-07 NOTE — H&P (Signed)
PATIENT NAME:  Jerry Davenport, Jerry Davenport MR#:  [id number] DATE OF BIRTH:  [birth year]  DATE OF ADMISSION:  08/24/2013  PRIMARY CARE PHYSICIAN: Arlis Porta., MD   CARDIOLOGY: Minna Merritts, MD  Jerry Davenport is a very pleasant 74 year old Caucasian gentleman who has history of hypercholesterolemia, hypertension, PVD, history of tobacco abuse in the past, comes into the Emergency Room after he started having increasing shortness of breath, feeling uncomfortable while lying down in the bed at nighttime, continuing to use recliner during nighttime, along with palpitations. The patient was recently discharged from [facility] on November 3 after he had a CABG x 3. His surgery was on October 27. Post CABG, he developed A. fib, was placed on metoprolol and amiodarone. He took a short course of Lasix after he was discharged to go home.   In the Emergency Room, the patient was found to be in rapid A. fib with heart rate anywhere from 105 to 115. His chest x-ray shows left lower lobe mild to moderate pleural effusion along with elevated BNP. The patient is being admitted with A. fib, RVR, along with congestive heart failure, unknown ejection fraction. The patient in the Emergency Room received IV Lasix and he also received an enema due to severe constipation. The patient feels relatively better at this time. However, he is going to be admitted for further evaluation and management.   PAST MEDICAL HISTORY: 1.  Coronary artery disease, status post CABG at [facility] on August 13, 2013.  2.  Hypertension.  3.  Hyperlipidemia.  4.  History of PVD.  5.  History of stroke.  6.  Tobacco abuse.  7.  History of allergic rhinitis.  8.  Hyperlipidemia.   MEDICATIONS: 1.  Pravastatin 40 mg p.o. daily.  2.  K-Dur 10 mEq p.o. daily for 5 days.  3.  Oxycodone 5 mg 1 tablet every 4 hours as needed.  4.  Nitrostat 0.4 mg sublingual every 5 minutes sublingual as needed.  5.  Metoprolol 25 mg b.i.d.  6.  Lisinopril 5 mg  daily. 7.  Lasix 40 mg p.o. daily for 5 days.  8.  Aspirin 325 mg p.o. daily.  9.  Amiodarone 200 mg twice a day.  10.  Acetaminophen 500 mg 1 tablet every 6 hours as needed.   ALLERGIES: No known drug allergies.   SOCIAL HISTORY: The patient lives at home with his wife. Former smoker. Nonalcoholic. Denies any other drug use.   FAMILY HISTORY: Positive for hypertension and CAD.   REVIEW OF SYSTEMS:    CONSTITUTIONAL: Positive for fatigue, weakness. No fever.  EYES: No blurred or double vision, glaucoma or cataracts.  EARS, NOSE, THROAT: No tinnitus, hearing loss or ear pain.  RESPIRATORY: Positive for cough and dyspnea on exertion along with orthopnea. CARDIOVASCULAR: No chest pain, orthopnea, edema. Positive for dyspnea on exertion and A. fib. GASTROINTESTINAL: Nausea. No vomiting, diarrhea, abdominal pain or hematemesis or GERD.  GENITOURINARY: No dysuria, hematuria or frequency.  ENDOCRINE: No polyuria, nocturia or thyroid problems.  HEMATOLOGIC: No anemia or easy bruising.  SKIN: No acne, rash or any other lesions.  MUSCULOSKELETAL: Positive for arthritis. No swelling or gout.  NEUROLOGIC: No CVA, TIA or dysarthria No vertigo.  PSYCHIATRIC: No anxiety or depression.   All other systems reviewed and negative.   PHYSICAL EXAMINATION: GENERAL: The patient is awake, alert, oriented x 3, not in acute distress.  VITAL SIGNS: He is afebrile. Pulse is 105 to 110. Blood pressure is 198/65.  Pulse oximetry is 96% on 2 liters.  HEENT: Atraumatic, normocephalic. Pupils: PERRLA. EOM intact. Oral mucosa is moist.  NECK: Supple. No JVD. No carotid bruit.  RESPIRATORY: Decreased breath sounds at the bases. No respiratory distress or labored breathing. No crackles heard. No use of accessory muscles.  CARDIOVASCULAR: Both the heart sounds are normal. Irregularly irregular rhythm. No murmur heard. PMI not lateralized. Chest nontender.  EXTREMITIES: Good pedal pulses, good femoral pulses. No lower  extremity edema.  ABDOMEN: Soft, benign, nontender. No organomegaly. Positive bowel sounds.  NEUROLOGIC: Grossly intact cranial nerves II through XII. No motor or sensory deficit.  PSYCHIATRIC: The patient is awake, alert, oriented x 3.   ASSESSMENT AND PLAN: A 74 year old Jerry Davenport with history of coronary artery disease, status post coronary artery bypass grafting x 3, hypertension, hyperlipidemia, comes in with: 1.  Atrial fibrillation with rapid ventricular response. Will place the patient on 2A. Will continue amiodarone 200 mg daily along with metoprolol 25 b.i.d. Will monitor heart rate. If needed, add Cardizem for rate blocking agent. Will cycle cardiac enzymes x 3. Electrolytes appear to be normal.  2.  Congestive heart failure, acute. Ejection fraction unknown. Suspect mild systolic. The patient is status post coronary artery bypass grafting and chest x-ray shows left-sided pleural effusion more than right, along with increased BNP to 4600. Will give IV Lasix twice a day and check I's and O's and monitor the patient's clinical symptoms and change to p.o. Lasix as indicated. Dr. Fletcher Anon to see patient in consultation. Agrees with plan. Will order echo. Follow up metabolic panel.  3.  Hyperlipidemia: Continue statins, which is pravastatin.  4.  Hypertension: The patient is on lisinopril and metoprolol.  5.  History of peripheral vascular disease: Continue aspirin and statins.  6.  Former tobacco abuse.  7.  Deep vein thrombosis prophylaxis with subcutaneous heparin.  8.  Further work-up according to patient's clinical course. Hospital admission plan was discussed with patient and patient's family.  TIME SPENT: 50 minutes. The case was also discussed with Dr. Fletcher Anon.    ____________________________ Hart Rochester. Posey Pronto, MD sap:jcm D: 08/24/2013 20:01:43 ET T: 08/24/2013 20:24:16 ET JOB#: [id number]  cc: Colbert Curenton A. Posey Pronto, MD, <Dictator> Arlis Porta., MD Minna Merritts, MD  Ilda Basset  MD ELECTRONICALLY SIGNED 09/10/2013 14:11

## 2015-02-07 NOTE — Op Note (Signed)
PATIENT NAME:  Jerry Davenport, Jerry Davenport MR#:  [id number] DATE OF BIRTH:  1941/02/02  DATE OF PROCEDURE:  07/19/2013  INTRAPROCEDURAL CONSULTATION AND OPERATIVE SUMMARY   REASON FOR CONSULTATION: Difficulty with vascular access.   HISTORY OF PRESENT ILLNESS: A 74 year old white male who was on the cardiac catheterization table to get a cardiac catheterization due to chest pain and coronary disease. Access attempts on the right were abandoned due to a pulseless groin. Initial access was unable to be achieved on the left from Dr. Rockey Situ, and we are asked to see the patient to try to obtain access. He is sedated on the catheterization table, so no history is readily available, and the exam is limited to the prepped groins, where the right femoral pulse is not easily detected and the left femoral pulse is 1 to 2+.   DESCRIPTION OF THE PROCEDURE: The ultrasound was used to visualize a patent left femoral artery. There was a calcific plaque in the left common femoral artery, which is likely the reason that Dr. Rockey Situ had difficulty getting wire access even when he had needle access. Using ultrasound guidance, I stuck just above the calcified point, and with a micropuncture needle, a micropuncture wire and sheath were then placed. A J-wire was placed and upsized to a standard 5 [place] sheath. At this point, I turned the table over to Dr. Rockey Situ for the performance of his cardiac catheterization.   ____________________________ Algernon Huxley, MD jsd:OSi D: 07/19/2013 12:53:03 ET T: 07/19/2013 13:26:01 ET JOB#: [id number]  cc: Algernon Huxley, MD, <Dictator> Algernon Huxley MD ELECTRONICALLY SIGNED 08/08/2013 10:19

## 2015-02-10 LAB — SURGICAL PATHOLOGY

## 2015-02-17 ENCOUNTER — Encounter: Payer: Self-pay | Admitting: *Deleted

## 2015-02-17 NOTE — Progress Notes (Unsigned)
Discussed with patient plan to schedule for EBUS next week. Confirmed no anticoagulant medications. Will call back when appointment confirmed.

## 2015-02-18 ENCOUNTER — Telehealth: Payer: Self-pay | Admitting: *Deleted

## 2015-02-18 NOTE — Telephone Encounter (Signed)
Notified of pre-op appointment scheduled for 5/4 at 12:30pm in [place], 2nd floor, to bring medications. Also notified of procedure scheduled for 5/6 Lane arrival. Reminded to be NPO after midnight, continue to avoid anticoagulant medications, and have a driver and support person to be with patient at home after the procedure. Wife verbalizes understanding and reads back appointments.

## 2015-02-19 ENCOUNTER — Encounter
Admission: RE | Admit: 2015-02-19 | Discharge: 2015-02-19 | Disposition: A | Discharge status: Home / Self Care | Payer: Commercial Managed Care - HMO | Source: Ambulatory Visit | Attending: Internal Medicine | Admitting: Internal Medicine

## 2015-02-19 DIAGNOSIS — R918 Other nonspecific abnormal finding of lung field: Secondary | ICD-10-CM | POA: Diagnosis present

## 2015-02-19 DIAGNOSIS — C3402 Malignant neoplasm of left main bronchus: Secondary | ICD-10-CM | POA: Diagnosis not present

## 2015-02-19 HISTORY — DX: Other mechanical complication of coronary artery bypass graft, initial encounter: T82.218A

## 2015-02-19 HISTORY — DX: Chronic obstructive pulmonary disease, unspecified: J44.9

## 2015-02-19 HISTORY — DX: Cardiac arrhythmia, unspecified: I49.9

## 2015-02-21 ENCOUNTER — Encounter: Payer: Self-pay | Admitting: *Deleted

## 2015-02-21 ENCOUNTER — Ambulatory Visit
Admission: RE | Admit: 2015-02-21 | Discharge: 2015-02-21 | Disposition: A | Discharge status: Home / Self Care | Payer: Commercial Managed Care - HMO | Source: Ambulatory Visit | Attending: Internal Medicine | Admitting: Internal Medicine

## 2015-02-21 ENCOUNTER — Ambulatory Visit: Payer: Commercial Managed Care - HMO | Admitting: Anesthesiology

## 2015-02-21 ENCOUNTER — Encounter
Admission: RE | Disposition: A | Discharge status: Home / Self Care | Payer: Self-pay | Source: Ambulatory Visit | Attending: Internal Medicine

## 2015-02-21 DIAGNOSIS — R918 Other nonspecific abnormal finding of lung field: Secondary | ICD-10-CM | POA: Diagnosis present

## 2015-02-21 DIAGNOSIS — C3402 Malignant neoplasm of left main bronchus: Secondary | ICD-10-CM | POA: Diagnosis not present

## 2015-02-21 HISTORY — PX: ENDOBRONCHIAL ULTRASOUND: SHX5096

## 2015-02-21 SURGERY — ENDOBRONCHIAL ULTRASOUND (EBUS)
Anesthesia: General

## 2015-02-21 MED ORDER — ONDANSETRON HCL 4 MG/2ML IJ SOLN
INTRAMUSCULAR | Status: DC | PRN
  Administered 2015-02-21: 4 mg via INTRAVENOUS

## 2015-02-21 MED ORDER — ONDANSETRON HCL 4 MG/2ML IJ SOLN: 4.0000 mg | Freq: Once | INTRAMUSCULAR | Status: DC | PRN

## 2015-02-21 MED ORDER — PROPOFOL 10 MG/ML IV BOLUS
INTRAVENOUS | Status: DC | PRN
  Administered 2015-02-21: 150 mg via INTRAVENOUS

## 2015-02-21 MED ORDER — EPHEDRINE SULFATE 50 MG/ML IJ SOLN
INTRAMUSCULAR | Status: DC | PRN
  Administered 2015-02-21: 10 mg via INTRAVENOUS

## 2015-02-21 MED ORDER — SUCCINYLCHOLINE CHLORIDE 20 MG/ML IJ SOLN
INTRAMUSCULAR | Status: DC | PRN
  Administered 2015-02-21: 100 mg via INTRAVENOUS

## 2015-02-21 MED ORDER — LIDOCAINE HCL (CARDIAC) 20 MG/ML IV SOLN
INTRAVENOUS | Status: DC | PRN
  Administered 2015-02-21: 100 mg via INTRAVENOUS

## 2015-02-21 MED ORDER — FENTANYL CITRATE (PF) 100 MCG/2ML IJ SOLN
INTRAMUSCULAR | Status: DC | PRN
  Administered 2015-02-21: 50 ug via INTRAVENOUS

## 2015-02-21 MED ORDER — LACTATED RINGERS IV SOLN
INTRAVENOUS | Status: DC
  Administered 2015-02-21: 12:00:00 via INTRAVENOUS

## 2015-02-21 MED ORDER — FENTANYL CITRATE (PF) 100 MCG/2ML IJ SOLN: 25.0000 ug | INTRAMUSCULAR | Status: DC | PRN

## 2015-02-21 NOTE — Anesthesia Postprocedure Evaluation (Signed)
  Anesthesia Post-op Note  Patient: Jerry Davenport  Procedure(s) Performed: Procedure(s): ENDOBRONCHIAL ULTRASOUND (N/A)  Anesthesia type:General  Patient location: PACU  Post pain: Pain level controlled  Post assessment: Post-op Vital signs reviewed, Patient's Cardiovascular Status Stable, Respiratory Function Stable, Patent Airway and No signs of Nausea or vomiting  Post vital signs: Reviewed and stable  Last Vitals:  Filed Vitals:   02/21/15 1301  BP: 144/79  Pulse: 59  Temp:   Resp:     Level of consciousness: awake, alert  and patient cooperative  Complications: No apparent anesthesia complications

## 2015-02-21 NOTE — Interval H&P Note (Signed)
History and Physical Interval Note:  02/21/2015 11:12 AM  Jerry Davenport  has presented today for surgery, with the diagnosis of LEFT UPPER LOBE MASS  The various methods of treatment have been discussed with the patient and family. After consideration of risks, benefits and other options for treatment, the patient has consented to  Procedure(s): ENDOBRONCHIAL ULTRASOUND (N/A) as a surgical intervention .  The patient's history has been reviewed, patient examined, no change in status, stable for surgery.  I have reviewed the patient's chart and labs.  Questions were answered to the patient's satisfaction.     [name]

## 2015-02-21 NOTE — H&P (Signed)
Paper H&P reviewed. No changes made, Will be scanned into system

## 2015-02-21 NOTE — Op Note (Signed)
PROCEDURE: ENDOBRONCHIAL ULTRASOUND   PROCEDURE DATE: 02/21/2015  TIME: There are other unrelated non-urgent complaints, but due to the busy schedule and the amount of time I've already spent with him, time does not permit me to address these routine issues at today's visit. I've requested another appointment to review these additional issues.   NAME:  Jerry Davenport  DOB:Oct 08, 1941  MRN: [id number] LOC:  [facility]/None    HOSP DAY: '@LENGTHOFSTAYDAYS'$ @ CODE STATUS:        Indications/Preliminary Diagnosis:   Consent: (Place X beside choice/s below)  The benefits, risks and possible complications of the procedure were        explained to:  _x__ patient  _x__ patient's family  ___ other:___________  who verbalized understanding and gave:  ___ verbal  ___ written  _x__ verbal and written  ___ telephone  ___ other:________ consent.      Unable to obtain consent; procedure performed on emergent basis.     Other:       PRESEDATION ASSESSMENT: History and Physical has been performed. Patient meds and allergies have been reviewed. Presedation airway examination has been performed and documented. Baseline vital signs, sedation score, oxygenation status, and cardiac rhythm were reviewed. Patient was deemed to be in satisfactory condition to undergo the procedure.  PREMEDICATIONS: SEE ANESTHESIOLOGY RECORDS  Sedative/Narcotic Amt Dose   Versed  mg   Fentanyl  mcg  Diprivan  mg        Airway Prep (Place X beside choice below)   1% Transtracheal Lidocaine Anesthetization 7 cc   Patient prepped per Bronchoscopy Lab Policy       Insertion Route (Place X beside choice below)   Nasal   Oral   Endotracheal Tube   Tracheostomy   INTRAPROCEDURE MEDICATIONS:  Sedative/Narcotic Amt Dose   Versed  mg   Fentanyl  mcg  Diprivan  mg       Medication Amt Dose  Medication Amt Dose  Xylocaine 2%  cc  Epinephrine 1:10,000 sol  cc  Xylocaine 4%  cc  Cocaine  cc   TECHNICAL PROCEDURES: (Place X beside  choice below)   Procedures  Description    None     Electrocautery     Cryotherapy     Balloon Dilatation     Bronchography     Stent Placement     Therapeutic Aspiration     Laser/Argon Plasma    Brachytherapy Catheter Placement    Foreign Body Removal     SPECIMENS (Sites): (Place X beside choice below)  Specimens Description   No Specimens Obtained     Washings    Lavage    Biopsies   x Fine Needle Aspirates Left Hilar Mass x 6 samples   Brushings    Sputum    FINDINGS: LUL endobronchial erythema and swelling, mucusol edema ESTIMATED BLOOD LOSS: none COMPLICATIONS/RESOLUTION: none   PROCEDURE DETAILS: Timeout performed and correct patient, name, & ID confirmed. Following prep per Pulmonary policy, appropriate sedation was administered.  Airway exam proceeded with findings, technical procedures, and specimen collection as noted below. At the end of exam the scope was withdrawn without incident. Impression and Plan as noted below.      IMPRESSION:POST-PROCEDURE DX: High suspicious for Malignancy     RECOMMENDATION/PLAN: follow up Biopsy results Will update family      Corrin Parker, M.D. [facility] Director Intensive Care Unit

## 2015-02-21 NOTE — Anesthesia Procedure Notes (Signed)
Procedure Name: Intubation Date/Time: 02/21/2015 11:42 AM Performed by: Dionne Bucy Oxygen Delivery Method: Circle system utilized Preoxygenation: Pre-oxygenation with 100% oxygen Intubation Type: IV induction Ventilation: Mask ventilation without difficulty Grade View: Grade II Tube type: Oral Tube size: 8.5 mm Number of attempts: 1 Airway Equipment and Method: Stylet Placement Confirmation: ETT inserted through vocal cords under direct vision,  positive ETCO2 and breath sounds checked- equal and bilateral Secured at: 21 cm Tube secured with: Tape Dental Injury: Teeth and Oropharynx as per pre-operative assessment  Difficulty Due To: Difficulty was unanticipated

## 2015-02-21 NOTE — Transfer of Care (Signed)
Immediate Anesthesia Transfer of Care Note  Patient: Jerry Davenport  Procedure(s) Performed: Procedure(s): ENDOBRONCHIAL ULTRASOUND (N/A)  Patient Location: PACU  Anesthesia Type:General  Level of Consciousness: awake, alert  and oriented  Airway & Oxygen Therapy: Patient Spontanous Breathing and Patient connected to face mask oxygen  Post-op Assessment: Report given to RN  Post vital signs: Reviewed and stable  Last Vitals:  Filed Vitals:   02/21/15 1231  BP: 138/73  Pulse: 64  Temp: 36.1 C  Resp: 18    Complications: No apparent anesthesia complications

## 2015-02-21 NOTE — Anesthesia Preprocedure Evaluation (Addendum)
Anesthesia Evaluation  Patient identified by MRN, date of birth, ID band Patient awake    Reviewed: Allergy & Precautions, NPO status , Patient's Chart, lab work & pertinent test results, reviewed documented beta blocker date and time   Airway Mallampati: II  TM Distance: >3 FB Neck ROM: Full    Dental  (+) Lower Dentures   Pulmonary shortness of breath, COPDformer smoker,          Cardiovascular hypertension, + CAD, + Past MI and + Peripheral Vascular Disease + dysrhythmias Atrial Fibrillation     Neuro/Psych  Headaches, CVA    GI/Hepatic GERD-  ,  Endo/Other    Renal/GU Renal disease     Musculoskeletal  (+) Arthritis -,   Abdominal   Peds  Hematology   Anesthesia Other Findings   Reproductive/Obstetrics                            Anesthesia Physical Anesthesia Plan  ASA: III  Anesthesia Plan: General   Post-op Pain Management:    Induction: Intravenous  Airway Management Planned: Oral ETT  Additional Equipment:   Intra-op Plan:   Post-operative Plan:   Informed Consent: I have reviewed the patients History and Physical, chart, labs and discussed the procedure including the risks, benefits and alternatives for the proposed anesthesia with the patient or authorized representative who has indicated his/her understanding and acceptance.     Plan Discussed with:   Anesthesia Plan Comments:         Anesthesia Quick Evaluation

## 2015-02-25 LAB — CYTOLOGY - NON PAP

## 2015-03-07 ENCOUNTER — Inpatient Hospital Stay: Payer: Commercial Managed Care - HMO | Attending: Internal Medicine | Admitting: Internal Medicine

## 2015-03-07 ENCOUNTER — Other Ambulatory Visit: Payer: Commercial Managed Care - HMO

## 2015-03-07 ENCOUNTER — Telehealth: Payer: Self-pay | Admitting: *Deleted

## 2015-03-07 DIAGNOSIS — J449 Chronic obstructive pulmonary disease, unspecified: Secondary | ICD-10-CM | POA: Insufficient documentation

## 2015-03-07 DIAGNOSIS — K219 Gastro-esophageal reflux disease without esophagitis: Secondary | ICD-10-CM | POA: Insufficient documentation

## 2015-03-07 DIAGNOSIS — R05 Cough: Secondary | ICD-10-CM | POA: Insufficient documentation

## 2015-03-07 DIAGNOSIS — I4891 Unspecified atrial fibrillation: Secondary | ICD-10-CM | POA: Insufficient documentation

## 2015-03-07 DIAGNOSIS — M129 Arthropathy, unspecified: Secondary | ICD-10-CM | POA: Insufficient documentation

## 2015-03-07 DIAGNOSIS — F17210 Nicotine dependence, cigarettes, uncomplicated: Secondary | ICD-10-CM | POA: Insufficient documentation

## 2015-03-07 DIAGNOSIS — C3412 Malignant neoplasm of upper lobe, left bronchus or lung: Secondary | ICD-10-CM | POA: Insufficient documentation

## 2015-03-07 DIAGNOSIS — Z79899 Other long term (current) drug therapy: Secondary | ICD-10-CM | POA: Insufficient documentation

## 2015-03-07 DIAGNOSIS — Z7982 Long term (current) use of aspirin: Secondary | ICD-10-CM | POA: Insufficient documentation

## 2015-03-07 DIAGNOSIS — I739 Peripheral vascular disease, unspecified: Secondary | ICD-10-CM | POA: Insufficient documentation

## 2015-03-07 DIAGNOSIS — R0602 Shortness of breath: Secondary | ICD-10-CM | POA: Insufficient documentation

## 2015-03-07 DIAGNOSIS — R51 Headache: Secondary | ICD-10-CM | POA: Insufficient documentation

## 2015-03-07 DIAGNOSIS — K5730 Diverticulosis of large intestine without perforation or abscess without bleeding: Secondary | ICD-10-CM | POA: Insufficient documentation

## 2015-03-07 DIAGNOSIS — I2510 Atherosclerotic heart disease of native coronary artery without angina pectoris: Secondary | ICD-10-CM | POA: Insufficient documentation

## 2015-03-07 DIAGNOSIS — Z8673 Personal history of transient ischemic attack (TIA), and cerebral infarction without residual deficits: Secondary | ICD-10-CM | POA: Insufficient documentation

## 2015-03-07 DIAGNOSIS — I252 Old myocardial infarction: Secondary | ICD-10-CM | POA: Insufficient documentation

## 2015-03-07 DIAGNOSIS — E785 Hyperlipidemia, unspecified: Secondary | ICD-10-CM | POA: Insufficient documentation

## 2015-03-07 DIAGNOSIS — M549 Dorsalgia, unspecified: Secondary | ICD-10-CM | POA: Insufficient documentation

## 2015-03-07 DIAGNOSIS — K5900 Constipation, unspecified: Secondary | ICD-10-CM | POA: Insufficient documentation

## 2015-03-07 DIAGNOSIS — I10 Essential (primary) hypertension: Secondary | ICD-10-CM | POA: Insufficient documentation

## 2015-03-07 DIAGNOSIS — R918 Other nonspecific abnormal finding of lung field: Secondary | ICD-10-CM | POA: Insufficient documentation

## 2015-03-07 NOTE — Telephone Encounter (Signed)
Oncology Nurse Navigator Documentation  Oncology Nurse Navigator Flowsheets 03/07/2015  Navigator Encounter Type Telephone  Time Spent with Patient 58   Per Dr. Marylene Land nurse request, discussed diagnosis and potential treatment options. Patient is scheduled for medical and radiation oncology appointments next week.

## 2015-03-10 ENCOUNTER — Other Ambulatory Visit: Payer: Commercial Managed Care - HMO

## 2015-03-10 ENCOUNTER — Ambulatory Visit: Payer: Commercial Managed Care - HMO | Admitting: Internal Medicine

## 2015-03-11 ENCOUNTER — Ambulatory Visit
Admission: RE | Admit: 2015-03-11 | Discharge: 2015-03-11 | Disposition: A | Discharge status: Home / Self Care | Payer: Commercial Managed Care - HMO | Source: Ambulatory Visit | Attending: Radiation Oncology | Admitting: Radiation Oncology

## 2015-03-11 ENCOUNTER — Institutional Professional Consult (permissible substitution): Payer: Commercial Managed Care - HMO | Admitting: Radiation Oncology

## 2015-03-11 ENCOUNTER — Inpatient Hospital Stay (HOSPITAL_BASED_OUTPATIENT_CLINIC_OR_DEPARTMENT_OTHER): Payer: Commercial Managed Care - HMO | Admitting: Oncology

## 2015-03-11 ENCOUNTER — Encounter: Payer: Self-pay | Admitting: Radiation Oncology

## 2015-03-11 VITALS — BP 121/74 | HR 63 | Temp 97.8°F | Resp 20 | Wt 159.2 lb

## 2015-03-11 DIAGNOSIS — I252 Old myocardial infarction: Secondary | ICD-10-CM | POA: Diagnosis not present

## 2015-03-11 DIAGNOSIS — I10 Essential (primary) hypertension: Secondary | ICD-10-CM

## 2015-03-11 DIAGNOSIS — M129 Arthropathy, unspecified: Secondary | ICD-10-CM | POA: Diagnosis not present

## 2015-03-11 DIAGNOSIS — I2510 Atherosclerotic heart disease of native coronary artery without angina pectoris: Secondary | ICD-10-CM

## 2015-03-11 DIAGNOSIS — F17210 Nicotine dependence, cigarettes, uncomplicated: Secondary | ICD-10-CM | POA: Diagnosis not present

## 2015-03-11 DIAGNOSIS — R05 Cough: Secondary | ICD-10-CM

## 2015-03-11 DIAGNOSIS — I4891 Unspecified atrial fibrillation: Secondary | ICD-10-CM

## 2015-03-11 DIAGNOSIS — K5730 Diverticulosis of large intestine without perforation or abscess without bleeding: Secondary | ICD-10-CM

## 2015-03-11 DIAGNOSIS — J449 Chronic obstructive pulmonary disease, unspecified: Secondary | ICD-10-CM

## 2015-03-11 DIAGNOSIS — M549 Dorsalgia, unspecified: Secondary | ICD-10-CM | POA: Diagnosis not present

## 2015-03-11 DIAGNOSIS — R0602 Shortness of breath: Secondary | ICD-10-CM | POA: Diagnosis not present

## 2015-03-11 DIAGNOSIS — C3402 Malignant neoplasm of left main bronchus: Secondary | ICD-10-CM | POA: Insufficient documentation

## 2015-03-11 DIAGNOSIS — Z8673 Personal history of transient ischemic attack (TIA), and cerebral infarction without residual deficits: Secondary | ICD-10-CM | POA: Diagnosis not present

## 2015-03-11 DIAGNOSIS — Z79899 Other long term (current) drug therapy: Secondary | ICD-10-CM | POA: Diagnosis not present

## 2015-03-11 DIAGNOSIS — Z510 Encounter for antineoplastic radiation therapy: Secondary | ICD-10-CM | POA: Insufficient documentation

## 2015-03-11 DIAGNOSIS — R51 Headache: Secondary | ICD-10-CM | POA: Diagnosis not present

## 2015-03-11 DIAGNOSIS — C3412 Malignant neoplasm of upper lobe, left bronchus or lung: Secondary | ICD-10-CM

## 2015-03-11 DIAGNOSIS — K5900 Constipation, unspecified: Secondary | ICD-10-CM | POA: Diagnosis not present

## 2015-03-11 DIAGNOSIS — E785 Hyperlipidemia, unspecified: Secondary | ICD-10-CM | POA: Diagnosis not present

## 2015-03-11 DIAGNOSIS — R918 Other nonspecific abnormal finding of lung field: Secondary | ICD-10-CM | POA: Diagnosis not present

## 2015-03-11 DIAGNOSIS — K219 Gastro-esophageal reflux disease without esophagitis: Secondary | ICD-10-CM

## 2015-03-11 DIAGNOSIS — I739 Peripheral vascular disease, unspecified: Secondary | ICD-10-CM

## 2015-03-11 DIAGNOSIS — Z7982 Long term (current) use of aspirin: Secondary | ICD-10-CM | POA: Diagnosis not present
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
## 2015-03-12 ENCOUNTER — Ambulatory Visit
Admission: RE | Admit: 2015-03-12 | Discharge: 2015-03-12 | Disposition: A | Discharge status: Home / Self Care | Payer: Commercial Managed Care - HMO | Source: Ambulatory Visit | Attending: Radiation Oncology | Admitting: Radiation Oncology

## 2015-03-12 ENCOUNTER — Other Ambulatory Visit: Payer: Self-pay | Admitting: *Deleted

## 2015-03-12 DIAGNOSIS — Z510 Encounter for antineoplastic radiation therapy: Secondary | ICD-10-CM | POA: Diagnosis not present

## 2015-03-12 DIAGNOSIS — C3402 Malignant neoplasm of left main bronchus: Secondary | ICD-10-CM | POA: Diagnosis not present

## 2015-03-12 MED ORDER — PROCHLORPERAZINE MALEATE 10 MG PO TABS: 10.0000 mg | ORAL_TABLET | Freq: Four times a day (QID) | ORAL | Status: AC | PRN

## 2015-03-20 ENCOUNTER — Ambulatory Visit: Payer: Commercial Managed Care - HMO

## 2015-03-20 DIAGNOSIS — Z510 Encounter for antineoplastic radiation therapy: Secondary | ICD-10-CM | POA: Diagnosis not present

## 2015-03-20 NOTE — Patient Instructions (Signed)
Paclitaxel injection What is this medicine? PACLITAXEL (PAK li TAX el) is a chemotherapy drug. It targets fast dividing cells, like cancer cells, and causes these cells to die. This medicine is used to treat ovarian cancer, breast cancer, and other cancers. This medicine may be used for other purposes; ask your health care provider or pharmacist if you have questions. COMMON BRAND NAME(S): Onxol, Taxol What should I tell my health care provider before I take this medicine? They need to know if you have any of these conditions: -blood disorders -irregular heartbeat -infection (especially a virus infection such as chickenpox, cold sores, or herpes) -liver disease -previous or ongoing radiation therapy -an unusual or allergic reaction to paclitaxel, alcohol, polyoxyethylated castor oil, other chemotherapy agents, other medicines, foods, dyes, or preservatives -pregnant or trying to get pregnant -breast-feeding How should I use this medicine? This drug is given as an infusion into a vein. It is administered in a hospital or clinic by a specially trained health care professional. Talk to your pediatrician regarding the use of this medicine in children. Special care may be needed. Overdosage: If you think you have taken too much of this medicine contact a poison control center or emergency room at once. NOTE: This medicine is only for you. Do not share this medicine with others. What if I miss a dose? It is important not to miss your dose. Call your doctor or health care professional if you are unable to keep an appointment. What may interact with this medicine? Do not take this medicine with any of the following medications: -disulfiram -metronidazole This medicine may also interact with the following medications: -cyclosporine -diazepam -ketoconazole -medicines to increase blood counts like filgrastim, pegfilgrastim, sargramostim -other chemotherapy drugs like cisplatin, doxorubicin,  epirubicin, etoposide, teniposide, vincristine -quinidine -testosterone -vaccines -verapamil Talk to your doctor or health care professional before taking any of these medicines: -acetaminophen -aspirin -ibuprofen -ketoprofen -naproxen This list may not describe all possible interactions. Give your health care provider a list of all the medicines, herbs, non-prescription drugs, or dietary supplements you use. Also tell them if you smoke, drink alcohol, or use illegal drugs. Some items may interact with your medicine. What should I watch for while using this medicine? Your condition will be monitored carefully while you are receiving this medicine. You will need important blood work done while you are taking this medicine. This drug may make you feel generally unwell. This is not uncommon, as chemotherapy can affect healthy cells as well as cancer cells. Report any side effects. Continue your course of treatment even though you feel ill unless your doctor tells you to stop. In some cases, you may be given additional medicines to help with side effects. Follow all directions for their use. Call your doctor or health care professional for advice if you get a fever, chills or sore throat, or other symptoms of a cold or flu. Do not treat yourself. This drug decreases your body's ability to fight infections. Try to avoid being around people who are sick. This medicine may increase your risk to bruise or bleed. Call your doctor or health care professional if you notice any unusual bleeding. Be careful brushing and flossing your teeth or using a toothpick because you may get an infection or bleed more easily. If you have any dental work done, tell your dentist you are receiving this medicine. Avoid taking products that contain aspirin, acetaminophen, ibuprofen, naproxen, or ketoprofen unless instructed by your doctor. These medicines may hide a fever.   Do not become pregnant while taking this medicine.  Women should inform their doctor if they wish to become pregnant or think they might be pregnant. There is a potential for serious side effects to an unborn child. Talk to your health care professional or pharmacist for more information. Do not breast-feed an infant while taking this medicine. Men are advised not to father a child while receiving this medicine. What side effects may I notice from receiving this medicine? Side effects that you should report to your doctor or health care professional as soon as possible: -allergic reactions like skin rash, itching or hives, swelling of the face, lips, or tongue -low blood counts - This drug may decrease the number of white blood cells, red blood cells and platelets. You may be at increased risk for infections and bleeding. -signs of infection - fever or chills, cough, sore throat, pain or difficulty passing urine -signs of decreased platelets or bleeding - bruising, pinpoint red spots on the skin, black, tarry stools, nosebleeds -signs of decreased red blood cells - unusually weak or tired, fainting spells, lightheadedness -breathing problems -chest pain -high or low blood pressure -mouth sores -nausea and vomiting -pain, swelling, redness or irritation at the injection site -pain, tingling, numbness in the hands or feet -slow or irregular heartbeat -swelling of the ankle, feet, hands Side effects that usually do not require medical attention (report to your doctor or health care professional if they continue or are bothersome): -bone pain -complete hair loss including hair on your head, underarms, pubic hair, eyebrows, and eyelashes -changes in the color of fingernails -diarrhea -loosening of the fingernails -loss of appetite -muscle or joint pain -red flush to skin -sweating This list may not describe all possible side effects. Call your doctor for medical advice about side effects. You may report side effects to FDA at  1-800-FDA-1088. Where should I keep my medicine? This drug is given in a hospital or clinic and will not be stored at home. NOTE: This sheet is a summary. It may not cover all possible information. If you have questions about this medicine, talk to your doctor, pharmacist, or health care provider.  2015, Elsevier/Gold Standard. (2012-11-27 16:41:21)  Carboplatin injection What is this medicine? CARBOPLATIN (KAR boe pla tin) is a chemotherapy drug. It targets fast dividing cells, like cancer cells, and causes these cells to die. This medicine is used to treat ovarian cancer and many other cancers. This medicine may be used for other purposes; ask your health care provider or pharmacist if you have questions. COMMON BRAND NAME(S): Paraplatin What should I tell my health care provider before I take this medicine? They need to know if you have any of these conditions: -blood disorders -hearing problems -kidney disease -recent or ongoing radiation therapy -an unusual or allergic reaction to carboplatin, cisplatin, other chemotherapy, other medicines, foods, dyes, or preservatives -pregnant or trying to get pregnant -breast-feeding How should I use this medicine? This drug is usually given as an infusion into a vein. It is administered in a hospital or clinic by a specially trained health care professional. Talk to your pediatrician regarding the use of this medicine in children. Special care may be needed. Overdosage: If you think you have taken too much of this medicine contact a poison control center or emergency room at once. NOTE: This medicine is only for you. Do not share this medicine with others. What if I miss a dose? It is important not to miss a dose. Call your   doctor or health care professional if you are unable to keep an appointment. What may interact with this medicine? -medicines for seizures -medicines to increase blood counts like filgrastim, pegfilgrastim,  sargramostim -some antibiotics like amikacin, gentamicin, neomycin, streptomycin, tobramycin -vaccines Talk to your doctor or health care professional before taking any of these medicines: -acetaminophen -aspirin -ibuprofen -ketoprofen -naproxen This list may not describe all possible interactions. Give your health care provider a list of all the medicines, herbs, non-prescription drugs, or dietary supplements you use. Also tell them if you smoke, drink alcohol, or use illegal drugs. Some items may interact with your medicine. What should I watch for while using this medicine? Your condition will be monitored carefully while you are receiving this medicine. You will need important blood work done while you are taking this medicine. This drug may make you feel generally unwell. This is not uncommon, as chemotherapy can affect healthy cells as well as cancer cells. Report any side effects. Continue your course of treatment even though you feel ill unless your doctor tells you to stop. In some cases, you may be given additional medicines to help with side effects. Follow all directions for their use. Call your doctor or health care professional for advice if you get a fever, chills or sore throat, or other symptoms of a cold or flu. Do not treat yourself. This drug decreases your body's ability to fight infections. Try to avoid being around people who are sick. This medicine may increase your risk to bruise or bleed. Call your doctor or health care professional if you notice any unusual bleeding. Be careful brushing and flossing your teeth or using a toothpick because you may get an infection or bleed more easily. If you have any dental work done, tell your dentist you are receiving this medicine. Avoid taking products that contain aspirin, acetaminophen, ibuprofen, naproxen, or ketoprofen unless instructed by your doctor. These medicines may hide a fever. Do not become pregnant while taking this  medicine. Women should inform their doctor if they wish to become pregnant or think they might be pregnant. There is a potential for serious side effects to an unborn child. Talk to your health care professional or pharmacist for more information. Do not breast-feed an infant while taking this medicine. What side effects may I notice from receiving this medicine? Side effects that you should report to your doctor or health care professional as soon as possible: -allergic reactions like skin rash, itching or hives, swelling of the face, lips, or tongue -signs of infection - fever or chills, cough, sore throat, pain or difficulty passing urine -signs of decreased platelets or bleeding - bruising, pinpoint red spots on the skin, black, tarry stools, nosebleeds -signs of decreased red blood cells - unusually weak or tired, fainting spells, lightheadedness -breathing problems -changes in hearing -changes in vision -chest pain -high blood pressure -low blood counts - This drug may decrease the number of white blood cells, red blood cells and platelets. You may be at increased risk for infections and bleeding. -nausea and vomiting -pain, swelling, redness or irritation at the injection site -pain, tingling, numbness in the hands or feet -problems with balance, talking, walking -trouble passing urine or change in the amount of urine Side effects that usually do not require medical attention (report to your doctor or health care professional if they continue or are bothersome): -hair loss -loss of appetite -metallic taste in the mouth or changes in taste This list may not describe all   possible side effects. Call your doctor for medical advice about side effects. You may report side effects to FDA at 1-800-FDA-1088. Where should I keep my medicine? This drug is given in a hospital or clinic and will not be stored at home. NOTE: This sheet is a summary. It may not cover all possible information. If you  have questions about this medicine, talk to your doctor, pharmacist, or health care provider.  2015, [employer]vier/Gold Standard. (2008-01-09 14:38:05)  

## 2015-03-23 ENCOUNTER — Other Ambulatory Visit: Payer: Self-pay | Admitting: Oncology

## 2015-03-23 DIAGNOSIS — C3412 Malignant neoplasm of upper lobe, left bronchus or lung: Secondary | ICD-10-CM

## 2015-03-24 ENCOUNTER — Ambulatory Visit: Payer: Commercial Managed Care - HMO | Admitting: Oncology

## 2015-03-24 ENCOUNTER — Ambulatory Visit
Admission: RE | Admit: 2015-03-24 | Discharge: 2015-03-24 | Disposition: A | Discharge status: Home / Self Care | Payer: Commercial Managed Care - HMO | Source: Ambulatory Visit | Attending: Radiation Oncology | Admitting: Radiation Oncology

## 2015-03-24 ENCOUNTER — Ambulatory Visit: Payer: Commercial Managed Care - HMO

## 2015-03-24 ENCOUNTER — Other Ambulatory Visit: Payer: Commercial Managed Care - HMO

## 2015-03-24 DIAGNOSIS — Z510 Encounter for antineoplastic radiation therapy: Secondary | ICD-10-CM | POA: Diagnosis not present

## 2015-03-24 DIAGNOSIS — C3490 Malignant neoplasm of unspecified part of unspecified bronchus or lung: Secondary | ICD-10-CM | POA: Insufficient documentation

## 2015-03-24 NOTE — Progress Notes (Signed)
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 Patient also has a suspicious nodule of the left lower lobe.  INTERVAL HISTORY: Patient returns to clinic today for further evaluation and treatment planning. He continues to have shortness of breath and chronic cough, but otherwise feels well. He denies any fevers. He has no neurologic complaints. He denies any weight loss. He has no chest pain. He denies any nausea, vomiting, constipation, or diarrhea. He has no urinary complaints. Patient otherwise feels well and offers no further specific complaints.  REVIEW OF SYSTEMS:   Review of Systems  Constitutional: Negative for fever and weight loss.  Respiratory: Positive for cough and shortness of breath. Negative for hemoptysis.   Cardiovascular: Negative.   Gastrointestinal: Negative.     As per HPI. Otherwise, a complete review of systems is negatve.  PAST MEDICAL HISTORY: Past Medical History  Diagnosis Date  . Hyposmolality and/or hyponatremia   . Other and unspecified hyperlipidemia   . Headache(784.0)   . Backache, unspecified   . Allergic rhinitis, cause unspecified   . Tobacco use disorder   . Other constipation   . Myocardial infarction   . Hypertension   . Shortness of breath     with exertion, pt. remarks that he "gives out more from his legs than his breathing"   . Stroke     [facility]- pt. told he had a stroke, but he doesn't think he did   . Peripheral vascular disease, unspecified   . GERD (gastroesophageal reflux disease)     indigestion on occasion   . Arthritis     back, legs, knees   . A-fib   . Kidney stones    right kidney  . Coronary artery disease   . Dysrhythmia     a fib  . 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 COPD (chronic obstructive pulmonary disease)     PAST SURGICAL HISTORY: Past Surgical History  Procedure Laterality Date  . Hemorroidectomy    . Cardiac catheterization  2014    [facility]  . Knee surgery  1990's    [facility]- Left knee, cartilage removed   . Multiple tooth extractions  1980's  . Coronary artery bypass graft N/A 08/13/2013    Procedure: CORONARY ARTERY BYPASS GRAFTING (CABG);  Surgeon: Melrose Nakayama, MD;  Location: [facility];  Service: Open Heart Surgery;  Laterality: N/A;  CABG x three,  using left internal mammary artery and right leg greater saphenous vein harvested endoscopically    FAMILY HISTORY Family History  Problem Relation Age of Onset  . Hypertension Mother        ADVANCED DIRECTIVES:    HEALTH MAINTENANCE: History  Substance Use Topics  . Smoking status: Former Smoker -- 0.25 packs/day for 60 years    Types: Cigarettes    Quit date: 12/10/2013  . Smokeless tobacco: Never Used     Comment: down to 2-4 cigs a day now  . Alcohol Use: No     Comment: none for three months, reports he hasn't ever been a heavy drinker     Colonoscopy:  PAP:  Bone density:  Lipid panel:  No Known Allergies  Current  Outpatient Prescriptions  Medication Sig Dispense Refill  . amiodarone (PACERONE) 200 MG tablet Take 1 tablet (200 mg total) by mouth 2 (two) times daily as needed. (Patient not taking: Reported on 03/11/2015) 60 tablet 3  . aspirin 162 MG EC tablet Take 162 mg by mouth daily.    Marland Kitchen gabapentin (NEURONTIN) 100 MG capsule Take 200 mg by mouth 3 (three) times daily.    Marland Kitchen HYDROcodone-acetaminophen (NORCO/VICODIN) 5-325 MG per tablet Take by mouth.    . loratadine (CLARITIN) 10 MG tablet Take 10 mg by mouth 2 (two) times daily.    . metoprolol tartrate (LOPRESSOR) 25 MG tablet Take 1 tablet (25 mg total) by mouth 2 (two) times daily.  (Patient taking differently: Take 12.5 mg by mouth 2 (two) times daily. ) 60 tablet 12  . nitroGLYCERIN (NITROSTAT) 0.4 MG SL tablet Place 1 tablet (0.4 mg total) under the tongue every 5 (five) minutes as needed. 25 tablet 12  . prochlorperazine (COMPAZINE) 10 MG tablet Take 1 tablet (10 mg total) by mouth every 6 (six) hours as needed for nausea or vomiting. 120 tablet 1  . triamterene-hydrochlorothiazide (DYAZIDE) 37.5-25 MG per capsule Take by mouth.    . valACYclovir (VALTREX) 1000 MG tablet   0   No current facility-administered medications for this visit.    OBJECTIVE: There were no vitals filed for this visit.   There is no weight on file to calculate BMI.    ECOG FS:1 - Symptomatic but completely ambulatory  General: Well-developed, well-nourished, no acute distress. Eyes: Pink conjunctiva, anicteric sclera. HEENT: Normocephalic, moist mucous membranes, clear oropharnyx. Lungs: Clear to auscultation bilaterally. Heart: Regular rate and rhythm. No rubs, murmurs, or gallops. Abdomen: Soft, nontender, nondistended. No organomegaly noted, normoactive bowel sounds. Musculoskeletal: No edema, cyanosis, or clubbing. Neuro: Alert, answering all questions appropriately. Cranial nerves grossly intact. Skin: No rashes or petechiae noted. Psych: Normal affect.   LAB RESULTS:  Lab Results  Component Value Date   NA 131* 08/24/2013   K 4.1 08/26/2013   CL 97* 08/24/2013   CO2 29 08/24/2013   GLUCOSE 111* 08/24/2013   BUN 18 08/24/2013   CREATININE 1.27 08/26/2013   CALCIUM 9.0 08/24/2013   PROT 7.2 08/24/2013   ALBUMIN 3.6 08/24/2013   AST 19 08/24/2013   ALT 18 08/24/2013   ALKPHOS 110 08/24/2013   BILITOT 0.5 08/09/2013   GFRNONAA 56* 08/26/2013   GFRAA >60 08/26/2013    Lab Results  Component Value Date   WBC 6.7 01/28/2015   NEUTROABS 4.6 01/28/2015   HGB 12.9* 01/28/2015   HCT 38.3* 01/28/2015   MCV 114* 01/28/2015   PLT 147* 01/28/2015     STUDIES: PET scan  completed January 22, 2015.  1. Intensely hypermetabolic left suprahilar mass within the central left upper lobe is most consists with bronchogenic carcinoma. 2. Moderately hypermetabolic nodular consolidation in the left lower lobe is more dense than 03/04/2014. Favor a second primary low-grade adenocarcinoma for this lesion. 3. Single inflamed diverticulum of the sigmoid colon.   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 Patient also has a suspicious nodule of the left lower lobe.  PLAN:    1. Lung cancer: Patient is not a surgical candidate, therefore will proceed with concurrent chemotherapy using carboplatinum and Taxol along with XRT. Will consider consolidation chemotherapy at the conclusion of his treatment, but given his advanced age and multiple medical conditions, will likely defer this option. Patient does not wish port  placement at this time, but will consider one in the future. Patient also had consultation with radiation oncology today. Return to clinic in approximately one to 2 weeks to initiate cycle 1 of weekly carboplatinum and Taxol. Patient will initiate his XRT approximately at the same time.  Approximately 30 minutes was spent in discussion and consultation.  Patient expressed understanding and was in agreement with this plan. He also understands that He can call clinic at any time with any questions, concerns, or complaints.   No matching staging information was found for the patient.  Lloyd Huger, MD   03/24/2015 9:34 AM

## 2015-03-25 ENCOUNTER — Ambulatory Visit
Admission: RE | Admit: 2015-03-25 | Discharge: 2015-03-25 | Disposition: A | Discharge status: Home / Self Care | Payer: Commercial Managed Care - HMO | Source: Ambulatory Visit | Attending: Radiation Oncology | Admitting: Radiation Oncology

## 2015-03-25 ENCOUNTER — Ambulatory Visit: Payer: Commercial Managed Care - HMO

## 2015-03-25 DIAGNOSIS — Z510 Encounter for antineoplastic radiation therapy: Secondary | ICD-10-CM | POA: Diagnosis not present

## 2015-03-26 ENCOUNTER — Inpatient Hospital Stay: Payer: Commercial Managed Care - HMO

## 2015-03-26 ENCOUNTER — Ambulatory Visit
Admission: RE | Admit: 2015-03-26 | Discharge: 2015-03-26 | Disposition: A | Discharge status: Home / Self Care | Payer: Commercial Managed Care - HMO | Source: Ambulatory Visit | Attending: Radiation Oncology | Admitting: Radiation Oncology

## 2015-03-26 ENCOUNTER — Inpatient Hospital Stay: Payer: Commercial Managed Care - HMO | Attending: Internal Medicine

## 2015-03-26 ENCOUNTER — Ambulatory Visit: Payer: Commercial Managed Care - HMO

## 2015-03-26 ENCOUNTER — Inpatient Hospital Stay (HOSPITAL_BASED_OUTPATIENT_CLINIC_OR_DEPARTMENT_OTHER): Payer: Commercial Managed Care - HMO | Admitting: Oncology

## 2015-03-26 VITALS — BP 119/70 | HR 62 | Temp 96.6°F | Resp 18

## 2015-03-26 VITALS — BP 124/73 | HR 64 | Temp 96.6°F | Resp 18 | Wt 159.2 lb

## 2015-03-26 DIAGNOSIS — C3412 Malignant neoplasm of upper lobe, left bronchus or lung: Secondary | ICD-10-CM

## 2015-03-26 DIAGNOSIS — Z87442 Personal history of urinary calculi: Secondary | ICD-10-CM | POA: Diagnosis not present

## 2015-03-26 DIAGNOSIS — R630 Anorexia: Secondary | ICD-10-CM | POA: Diagnosis not present

## 2015-03-26 DIAGNOSIS — R0602 Shortness of breath: Secondary | ICD-10-CM

## 2015-03-26 DIAGNOSIS — Z5111 Encounter for antineoplastic chemotherapy: Secondary | ICD-10-CM | POA: Insufficient documentation

## 2015-03-26 DIAGNOSIS — R05 Cough: Secondary | ICD-10-CM | POA: Diagnosis not present

## 2015-03-26 DIAGNOSIS — I2510 Atherosclerotic heart disease of native coronary artery without angina pectoris: Secondary | ICD-10-CM

## 2015-03-26 DIAGNOSIS — M129 Arthropathy, unspecified: Secondary | ICD-10-CM | POA: Insufficient documentation

## 2015-03-26 DIAGNOSIS — K5900 Constipation, unspecified: Secondary | ICD-10-CM | POA: Diagnosis not present

## 2015-03-26 DIAGNOSIS — E785 Hyperlipidemia, unspecified: Secondary | ICD-10-CM

## 2015-03-26 DIAGNOSIS — I252 Old myocardial infarction: Secondary | ICD-10-CM | POA: Insufficient documentation

## 2015-03-26 DIAGNOSIS — F17210 Nicotine dependence, cigarettes, uncomplicated: Secondary | ICD-10-CM

## 2015-03-26 DIAGNOSIS — I10 Essential (primary) hypertension: Secondary | ICD-10-CM | POA: Insufficient documentation

## 2015-03-26 DIAGNOSIS — K5730 Diverticulosis of large intestine without perforation or abscess without bleeding: Secondary | ICD-10-CM | POA: Diagnosis not present

## 2015-03-26 DIAGNOSIS — Z79899 Other long term (current) drug therapy: Secondary | ICD-10-CM | POA: Insufficient documentation

## 2015-03-26 DIAGNOSIS — I739 Peripheral vascular disease, unspecified: Secondary | ICD-10-CM

## 2015-03-26 DIAGNOSIS — R5383 Other fatigue: Secondary | ICD-10-CM | POA: Insufficient documentation

## 2015-03-26 DIAGNOSIS — Z7982 Long term (current) use of aspirin: Secondary | ICD-10-CM | POA: Insufficient documentation

## 2015-03-26 DIAGNOSIS — Z8673 Personal history of transient ischemic attack (TIA), and cerebral infarction without residual deficits: Secondary | ICD-10-CM | POA: Diagnosis not present

## 2015-03-26 DIAGNOSIS — K219 Gastro-esophageal reflux disease without esophagitis: Secondary | ICD-10-CM | POA: Insufficient documentation

## 2015-03-26 DIAGNOSIS — I4891 Unspecified atrial fibrillation: Secondary | ICD-10-CM

## 2015-03-26 DIAGNOSIS — R531 Weakness: Secondary | ICD-10-CM | POA: Insufficient documentation

## 2015-03-26 DIAGNOSIS — Z510 Encounter for antineoplastic radiation therapy: Secondary | ICD-10-CM | POA: Diagnosis not present

## 2015-03-26 LAB — CBC WITH DIFFERENTIAL/PLATELET
Basophils Absolute: 0.1 10*3/uL (ref 0–0.1)
Basophils Relative: 1 %
EOS ABS: 0.2 10*3/uL (ref 0–0.7)
Eosinophils Relative: 4 %
HCT: 35 % — ABNORMAL LOW (ref 40.0–52.0)
Hemoglobin: 11.9 g/dL — ABNORMAL LOW (ref 13.0–18.0)
Lymphocytes Relative: 14 %
Lymphs Abs: 0.8 10*3/uL — ABNORMAL LOW (ref 1.0–3.6)
MCH: 38.2 pg — ABNORMAL HIGH (ref 26.0–34.0)
MCHC: 33.9 g/dL (ref 32.0–36.0)
MCV: 112.6 fL — ABNORMAL HIGH (ref 80.0–100.0)
MONOS PCT: 4 %
Monocytes Absolute: 0.2 10*3/uL (ref 0.2–1.0)
NEUTROS PCT: 77 %
Neutro Abs: 4.3 10*3/uL (ref 1.4–6.5)
Platelets: 153 10*3/uL (ref 150–440)
RBC: 3.11 MIL/uL — ABNORMAL LOW (ref 4.40–5.90)
RDW: 15.3 % — ABNORMAL HIGH (ref 11.5–14.5)
WBC: 5.6 10*3/uL (ref 3.8–10.6)

## 2015-03-26 LAB — COMPREHENSIVE METABOLIC PANEL
ALBUMIN: 3.5 g/dL (ref 3.5–5.0)
ALK PHOS: 77 U/L (ref 38–126)
ALT: 9 U/L — AB (ref 17–63)
ANION GAP: 4 — AB (ref 5–15)
AST: 13 U/L — AB (ref 15–41)
BILIRUBIN TOTAL: 1 mg/dL (ref 0.3–1.2)
BUN: 18 mg/dL (ref 6–20)
CO2: 26 mmol/L (ref 22–32)
Calcium: 8.7 mg/dL — ABNORMAL LOW (ref 8.9–10.3)
Chloride: 106 mmol/L (ref 101–111)
Creatinine, Ser: 1.09 mg/dL (ref 0.61–1.24)
GFR calc Af Amer: >60 mL/min (ref >60)
GFR calc non Af Amer: >60 mL/min (ref >60)
Glucose, Bld: 106 mg/dL — ABNORMAL HIGH (ref 65–99)
Potassium: 4.5 mmol/L (ref 3.5–5.1)
SODIUM: 136 mmol/L (ref 135–145)
Total Protein: 6.6 g/dL (ref 6.5–8.1)

## 2015-03-26 MED ORDER — SODIUM CHLORIDE 0.9 % IV SOLN: Freq: Once | INTRAVENOUS | Status: DC

## 2015-03-26 MED ORDER — SODIUM CHLORIDE 0.9 % IV SOLN
171.4000 mg | Freq: Once | INTRAVENOUS | Status: AC
  Administered 2015-03-26: 170 mg via INTRAVENOUS
  Filled 2015-03-26: qty 17

## 2015-03-26 MED ORDER — FAMOTIDINE IN NACL 20-0.9 MG/50ML-% IV SOLN
20.0000 mg | Freq: Once | INTRAVENOUS | Status: AC
  Administered 2015-03-26: 20 mg via INTRAVENOUS
  Filled 2015-03-26: qty 50

## 2015-03-26 MED ORDER — FOSAPREPITANT DIMEGLUMINE INJECTION 150 MG
Freq: Once | INTRAVENOUS | Status: AC
  Administered 2015-03-26: 13:00:00 via INTRAVENOUS
  Filled 2015-03-26: qty 5

## 2015-03-26 MED ORDER — HYDROCODONE-ACETAMINOPHEN 5-325 MG PO TABS: 1.0000 | ORAL_TABLET | Freq: Four times a day (QID) | ORAL | Status: DC | PRN

## 2015-03-26 MED ORDER — DEXTROSE 5 % IV SOLN
80.0000 mg/m2 | Freq: Once | INTRAVENOUS | Status: AC
  Administered 2015-03-26: 150 mg via INTRAVENOUS
  Filled 2015-03-26: qty 25

## 2015-03-26 MED ORDER — DIPHENHYDRAMINE HCL 50 MG/ML IJ SOLN
25.0000 mg | Freq: Once | INTRAMUSCULAR | Status: AC
  Administered 2015-03-26: 25 mg via INTRAVENOUS
  Filled 2015-03-26: qty 1

## 2015-03-26 MED ORDER — PALONOSETRON HCL INJECTION 0.25 MG/5ML
0.2500 mg | Freq: Once | INTRAVENOUS | Status: AC
  Administered 2015-03-26: 0.25 mg via INTRAVENOUS
  Filled 2015-03-26: qty 5

## 2015-03-26 MED ORDER — SODIUM CHLORIDE 0.9 % IV SOLN
Freq: Once | INTRAVENOUS | Status: AC
Start: 2015-03-26 — End: 2015-03-26
  Administered 2015-03-26: 12:00:00 via INTRAVENOUS
  Filled 2015-03-26: qty 1000

## 2015-03-26 NOTE — Progress Notes (Signed)
Patient is having left side hip pain that radiates down his leg.  He has been diagnosed with shingles 3 months ago and currently taking Gabapentin.

## 2015-03-26 NOTE — Progress Notes (Signed)
Patient received all premeds for first time chemo treatment - just prior to starting taxol patient states he has had two sharp pains in his chest, history of chest pain, nitro in his pocket, but does not feel he needs to take nitro as pain was sharp and went away.  Raechel Chute, RN to verify whether to start chemo, she will speak to Dr. Grayland Ormond for further direction.   Tillie Rung, RN spoke with Dr. Grayland Ormond who said to proceed with the chemo treatment.

## 2015-03-27 ENCOUNTER — Ambulatory Visit
Admission: RE | Admit: 2015-03-27 | Discharge: 2015-03-27 | Disposition: A | Discharge status: Home / Self Care | Payer: Commercial Managed Care - HMO | Source: Ambulatory Visit | Attending: Radiation Oncology | Admitting: Radiation Oncology

## 2015-03-27 ENCOUNTER — Ambulatory Visit: Payer: Commercial Managed Care - HMO

## 2015-03-27 DIAGNOSIS — Z510 Encounter for antineoplastic radiation therapy: Secondary | ICD-10-CM | POA: Diagnosis not present

## 2015-03-28 ENCOUNTER — Ambulatory Visit
Admission: RE | Admit: 2015-03-28 | Discharge: 2015-03-28 | Disposition: A | Discharge status: Home / Self Care | Payer: Commercial Managed Care - HMO | Source: Ambulatory Visit | Attending: Radiation Oncology | Admitting: Radiation Oncology

## 2015-03-28 ENCOUNTER — Ambulatory Visit: Payer: Commercial Managed Care - HMO

## 2015-03-28 DIAGNOSIS — Z510 Encounter for antineoplastic radiation therapy: Secondary | ICD-10-CM | POA: Diagnosis not present

## 2015-03-31 ENCOUNTER — Ambulatory Visit
Admission: RE | Admit: 2015-03-31 | Discharge: 2015-03-31 | Disposition: A | Discharge status: Home / Self Care | Payer: Commercial Managed Care - HMO | Source: Ambulatory Visit | Attending: Radiation Oncology | Admitting: Radiation Oncology

## 2015-03-31 ENCOUNTER — Ambulatory Visit: Payer: Commercial Managed Care - HMO

## 2015-03-31 DIAGNOSIS — Z510 Encounter for antineoplastic radiation therapy: Secondary | ICD-10-CM | POA: Diagnosis not present

## 2015-04-01 ENCOUNTER — Ambulatory Visit
Admission: RE | Admit: 2015-04-01 | Discharge: 2015-04-01 | Disposition: A | Discharge status: Home / Self Care | Payer: Commercial Managed Care - HMO | Source: Ambulatory Visit | Attending: Radiation Oncology | Admitting: Radiation Oncology

## 2015-04-01 ENCOUNTER — Ambulatory Visit: Payer: Commercial Managed Care - HMO

## 2015-04-01 ENCOUNTER — Other Ambulatory Visit: Payer: Self-pay | Admitting: *Deleted

## 2015-04-01 DIAGNOSIS — Z510 Encounter for antineoplastic radiation therapy: Secondary | ICD-10-CM | POA: Diagnosis not present

## 2015-04-01 MED ORDER — SUCRALFATE 1 G PO TABS: 1.0000 g | ORAL_TABLET | Freq: Three times a day (TID) | ORAL | Status: DC

## 2015-04-02 ENCOUNTER — Ambulatory Visit: Payer: Commercial Managed Care - HMO

## 2015-04-02 ENCOUNTER — Ambulatory Visit
Admission: RE | Admit: 2015-04-02 | Discharge: 2015-04-02 | Disposition: A | Discharge status: Home / Self Care | Payer: Commercial Managed Care - HMO | Source: Ambulatory Visit | Attending: Radiation Oncology | Admitting: Radiation Oncology

## 2015-04-02 ENCOUNTER — Inpatient Hospital Stay: Payer: Commercial Managed Care - HMO

## 2015-04-02 ENCOUNTER — Inpatient Hospital Stay (HOSPITAL_BASED_OUTPATIENT_CLINIC_OR_DEPARTMENT_OTHER): Payer: Commercial Managed Care - HMO | Admitting: Oncology

## 2015-04-02 VITALS — BP 98/62 | HR 68 | Temp 98.6°F | Resp 24 | Wt 158.9 lb

## 2015-04-02 DIAGNOSIS — F17210 Nicotine dependence, cigarettes, uncomplicated: Secondary | ICD-10-CM

## 2015-04-02 DIAGNOSIS — Z510 Encounter for antineoplastic radiation therapy: Secondary | ICD-10-CM | POA: Diagnosis not present

## 2015-04-02 DIAGNOSIS — I2510 Atherosclerotic heart disease of native coronary artery without angina pectoris: Secondary | ICD-10-CM

## 2015-04-02 DIAGNOSIS — Z79899 Other long term (current) drug therapy: Secondary | ICD-10-CM

## 2015-04-02 DIAGNOSIS — K5900 Constipation, unspecified: Secondary | ICD-10-CM

## 2015-04-02 DIAGNOSIS — I739 Peripheral vascular disease, unspecified: Secondary | ICD-10-CM

## 2015-04-02 DIAGNOSIS — K219 Gastro-esophageal reflux disease without esophagitis: Secondary | ICD-10-CM

## 2015-04-02 DIAGNOSIS — Z5111 Encounter for antineoplastic chemotherapy: Secondary | ICD-10-CM | POA: Diagnosis not present

## 2015-04-02 DIAGNOSIS — I4891 Unspecified atrial fibrillation: Secondary | ICD-10-CM

## 2015-04-02 DIAGNOSIS — R05 Cough: Secondary | ICD-10-CM | POA: Diagnosis not present

## 2015-04-02 DIAGNOSIS — C3412 Malignant neoplasm of upper lobe, left bronchus or lung: Secondary | ICD-10-CM

## 2015-04-02 DIAGNOSIS — I10 Essential (primary) hypertension: Secondary | ICD-10-CM

## 2015-04-02 DIAGNOSIS — Z8673 Personal history of transient ischemic attack (TIA), and cerebral infarction without residual deficits: Secondary | ICD-10-CM

## 2015-04-02 DIAGNOSIS — R0602 Shortness of breath: Secondary | ICD-10-CM | POA: Diagnosis not present

## 2015-04-02 DIAGNOSIS — C3490 Malignant neoplasm of unspecified part of unspecified bronchus or lung: Secondary | ICD-10-CM

## 2015-04-02 DIAGNOSIS — M129 Arthropathy, unspecified: Secondary | ICD-10-CM

## 2015-04-02 DIAGNOSIS — I252 Old myocardial infarction: Secondary | ICD-10-CM

## 2015-04-02 DIAGNOSIS — Z7982 Long term (current) use of aspirin: Secondary | ICD-10-CM

## 2015-04-02 DIAGNOSIS — E785 Hyperlipidemia, unspecified: Secondary | ICD-10-CM

## 2015-04-02 DIAGNOSIS — K5730 Diverticulosis of large intestine without perforation or abscess without bleeding: Secondary | ICD-10-CM

## 2015-04-02 DIAGNOSIS — Z87442 Personal history of urinary calculi: Secondary | ICD-10-CM

## 2015-04-02 LAB — CBC WITH DIFFERENTIAL/PLATELET
BASOS ABS: 0 10*3/uL (ref 0–0.1)
BASOS PCT: 2 %
Eosinophils Absolute: 0.1 10*3/uL (ref 0–0.7)
Eosinophils Relative: 5 %
HCT: 28.9 % — ABNORMAL LOW (ref 40.0–52.0)
Hemoglobin: 10 g/dL — ABNORMAL LOW (ref 13.0–18.0)
Lymphocytes Relative: 19 %
Lymphs Abs: 0.5 10*3/uL — ABNORMAL LOW (ref 1.0–3.6)
MCH: 38.7 pg — ABNORMAL HIGH (ref 26.0–34.0)
MCHC: 34.5 g/dL (ref 32.0–36.0)
MCV: 112.3 fL — ABNORMAL HIGH (ref 80.0–100.0)
MONOS PCT: 7 %
Monocytes Absolute: 0.2 10*3/uL (ref 0.2–1.0)
Neutro Abs: 1.9 10*3/uL (ref 1.4–6.5)
Neutrophils Relative %: 67 %
Platelets: 133 10*3/uL — ABNORMAL LOW (ref 150–440)
RBC: 2.58 MIL/uL — ABNORMAL LOW (ref 4.40–5.90)
RDW: 15.5 % — ABNORMAL HIGH (ref 11.5–14.5)
WBC: 2.8 10*3/uL — AB (ref 3.8–10.6)

## 2015-04-02 LAB — COMPREHENSIVE METABOLIC PANEL
ALBUMIN: 3.2 g/dL — AB (ref 3.5–5.0)
ALK PHOS: 75 U/L (ref 38–126)
ALT: 27 U/L (ref 17–63)
ANION GAP: 5 (ref 5–15)
AST: 18 U/L (ref 15–41)
BUN: 26 mg/dL — AB (ref 6–20)
CALCIUM: 7.9 mg/dL — AB (ref 8.9–10.3)
CO2: 24 mmol/L (ref 22–32)
CREATININE: 0.97 mg/dL (ref 0.61–1.24)
Chloride: 105 mmol/L (ref 101–111)
Glucose, Bld: 106 mg/dL — ABNORMAL HIGH (ref 65–99)
POTASSIUM: 4.2 mmol/L (ref 3.5–5.1)
Sodium: 134 mmol/L — ABNORMAL LOW (ref 135–145)
TOTAL PROTEIN: 6.1 g/dL — AB (ref 6.5–8.1)
Total Bilirubin: 1.2 mg/dL (ref 0.3–1.2)

## 2015-04-02 MED ORDER — SODIUM CHLORIDE 0.9 % IV SOLN
Freq: Once | INTRAVENOUS | Status: AC
  Administered 2015-04-02: 11:00:00 via INTRAVENOUS
  Filled 2015-04-02: qty 1000

## 2015-04-02 MED ORDER — SODIUM CHLORIDE 0.9 % IV SOLN
Freq: Once | INTRAVENOUS | Status: DC
  Filled 2015-04-02: qty 1000

## 2015-04-02 NOTE — Progress Notes (Signed)
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 Patient also has a suspicious nodule of the left lower lobe.  INTERVAL HISTORY: Patient returns to clinic today for further evaluation and consideration of cycle 1 of concurrent carboplatinum and Taxol with XRT. He continues to have shortness of breath and chronic cough, but otherwise feels well. He denies any fevers. He has no neurologic complaints. He denies any weight loss. He has no chest pain. He denies any nausea, vomiting, constipation, or diarrhea. He has no urinary complaints. Patient offers no further specific complaints.  REVIEW OF SYSTEMS:   Review of Systems  Constitutional: Negative for fever and weight loss.  Respiratory: Positive for cough and shortness of breath. Negative for hemoptysis.   Cardiovascular: Negative.   Gastrointestinal: Negative.     As per HPI. Otherwise, a complete review of systems is negatve.  PAST MEDICAL HISTORY: Past Medical History  Diagnosis Date  . Hyposmolality and/or hyponatremia   . Other and unspecified hyperlipidemia   . Headache(784.0)   . Backache, unspecified   . Allergic rhinitis, cause unspecified   . Tobacco use disorder   . Other constipation   . Myocardial infarction   . Hypertension   . Shortness of breath     with exertion, pt. remarks that he "gives out more from his legs than his breathing"   . Stroke     [facility]- pt. told he had a stroke, but he doesn't think he did   . Peripheral vascular disease, unspecified   . GERD (gastroesophageal reflux disease)     indigestion on occasion   . Arthritis     back, legs, knees   .  A-fib   . Kidney stones     right kidney  . Coronary artery disease   . Dysrhythmia     a fib  . 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 COPD (chronic obstructive pulmonary disease)     PAST SURGICAL HISTORY: Past Surgical History  Procedure Laterality Date  . Hemorroidectomy    . Cardiac catheterization  2014    [facility]  . Knee surgery  1990's    [facility]- Left knee, cartilage removed   . Multiple tooth extractions  1980's  . Coronary artery bypass graft N/A 08/13/2013    Procedure: CORONARY ARTERY BYPASS GRAFTING (CABG);  Surgeon: Melrose Nakayama, MD;  Location: [place];  Service: Open Heart Surgery;  Laterality: N/A;  CABG x three,  using left internal mammary artery and right leg greater saphenous vein harvested endoscopically    FAMILY HISTORY Family History  Problem Relation Age of Onset  . Hypertension Mother        ADVANCED DIRECTIVES:    HEALTH MAINTENANCE: History  Substance Use Topics  . Smoking status: Former Smoker -- 0.25 packs/day for 60 years    Types: Cigarettes    Quit date: 12/10/2013  . Smokeless tobacco: Never Used     Comment: down to 2-4 cigs a day now  . Alcohol Use: No     Comment: none for three months, reports he hasn't ever been a heavy drinker     Colonoscopy:  PAP:  Bone density:  Lipid  panel:  No Known Allergies  Current Outpatient Prescriptions  Medication Sig Dispense Refill  . amiodarone (PACERONE) 200 MG tablet Take 1 tablet (200 mg total) by mouth 2 (two) times daily as needed. 60 tablet 3  . aspirin 162 MG EC tablet Take 162 mg by mouth daily.    Marland Kitchen gabapentin (NEURONTIN) 100 MG capsule Take 200 mg by mouth 3 (three) times daily.    Marland Kitchen loratadine (CLARITIN) 10 MG tablet Take 10 mg by mouth 2 (two) times daily.    . metoprolol tartrate (LOPRESSOR) 25 MG tablet Take 1 tablet (25 mg total) by mouth 2 (two) times daily. (Patient taking differently: Take 12.5 mg by mouth 2 (two) times daily. ) 60 tablet 12  .  nitroGLYCERIN (NITROSTAT) 0.4 MG SL tablet Place 1 tablet (0.4 mg total) under the tongue every 5 (five) minutes as needed. 25 tablet 12  . prochlorperazine (COMPAZINE) 10 MG tablet Take 1 tablet (10 mg total) by mouth every 6 (six) hours as needed for nausea or vomiting. 120 tablet 1  . sucralfate (CARAFATE) 1 G tablet Take 1 tablet (1 g total) by mouth 4 (four) times daily -  with meals and at bedtime. 90 tablet 2  . triamterene-hydrochlorothiazide (DYAZIDE) 37.5-25 MG per capsule Take by mouth.     No current facility-administered medications for this visit.    OBJECTIVE: Filed Vitals:   03/26/15 1354  BP: 124/73  Pulse: 64  Temp: 96.6 F (35.9 C)  Resp: 18     Body mass index is 24.21 kg/(m^2).    ECOG FS:1 - Symptomatic but completely ambulatory  General: Well-developed, well-nourished, no acute distress. Eyes: anicteric sclera. Lungs: Clear to auscultation bilaterally. Heart: Regular rate and rhythm. No rubs, murmurs, or gallops. Abdomen: Soft, nontender, nondistended. No organomegaly noted, normoactive bowel sounds. Musculoskeletal: No edema, cyanosis, or clubbing. Neuro: Alert, answering all questions appropriately. Cranial nerves grossly intact. Skin: No rashes or petechiae noted. Psych: Normal affect.   LAB RESULTS:  Lab Results  Component Value Date   NA 136 03/26/2015   K 4.5 03/26/2015   CL 106 03/26/2015   CO2 26 03/26/2015   GLUCOSE 106* 03/26/2015   BUN 18 03/26/2015   CREATININE 1.09 03/26/2015   CALCIUM 8.7* 03/26/2015   PROT 6.6 03/26/2015   ALBUMIN 3.5 03/26/2015   AST 13* 03/26/2015   ALT 9* 03/26/2015   ALKPHOS 77 03/26/2015   BILITOT 1.0 03/26/2015   GFRNONAA >60 03/26/2015   GFRAA >60 03/26/2015    Lab Results  Component Value Date   WBC 5.6 03/26/2015   NEUTROABS 4.3 03/26/2015   HGB 11.9* 03/26/2015   HCT 35.0* 03/26/2015   MCV 112.6* 03/26/2015   PLT 153 03/26/2015     STUDIES: PET scan completed January 22, 2015.  1. Intensely  hypermetabolic left suprahilar mass within the central left upper lobe is most consists with bronchogenic carcinoma. 2. Moderately hypermetabolic nodular consolidation in the left lower lobe is more dense than 03/04/2014. Favor a second primary low-grade adenocarcinoma for this lesion. 3. Single inflamed diverticulum of the sigmoid colon.   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 Patient also has a suspicious nodule of the left lower lobe.  PLAN:    1. Lung cancer: Patient is not a surgical candidate. Proceed with cycle 1 of weekly carboplatinum and Taxol. Continue daily XRT. Will consider consolidation chemotherapy at the conclusion of his treatment, but given his advanced age and multiple medical conditions, will  likely defer this option. Patient does not wish port placement at this time, but will consider one in the future. Return to clinic in approximately in 1 week for consideration of cycle 2.    Patient expressed understanding and was in agreement with this plan. He also understands that He can call clinic at any time with any questions, concerns, or complaints.   No matching staging information was found for the patient.  Lloyd Huger, MD   04/02/2015 8:47 AM

## 2015-04-03 ENCOUNTER — Ambulatory Visit: Payer: Commercial Managed Care - HMO

## 2015-04-03 ENCOUNTER — Ambulatory Visit
Admission: RE | Admit: 2015-04-03 | Discharge: 2015-04-03 | Disposition: A | Discharge status: Home / Self Care | Payer: Commercial Managed Care - HMO | Source: Ambulatory Visit | Attending: Radiation Oncology | Admitting: Radiation Oncology

## 2015-04-03 DIAGNOSIS — Z510 Encounter for antineoplastic radiation therapy: Secondary | ICD-10-CM | POA: Diagnosis not present

## 2015-04-04 ENCOUNTER — Ambulatory Visit: Payer: Commercial Managed Care - HMO

## 2015-04-04 ENCOUNTER — Telehealth: Payer: Self-pay | Admitting: Family Medicine

## 2015-04-04 ENCOUNTER — Ambulatory Visit
Admission: RE | Admit: 2015-04-04 | Discharge: 2015-04-04 | Disposition: A | Discharge status: Home / Self Care | Payer: Commercial Managed Care - HMO | Source: Ambulatory Visit | Attending: Radiation Oncology | Admitting: Radiation Oncology

## 2015-04-04 DIAGNOSIS — Z510 Encounter for antineoplastic radiation therapy: Secondary | ICD-10-CM | POA: Diagnosis not present

## 2015-04-04 NOTE — Progress Notes (Signed)
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 Patient also has a suspicious nodule of the left lower lobe.  INTERVAL HISTORY: Patient returns to clinic today for further evaluation and consideration of cycle 2 of concurrent carboplatinum and Taxol with XRT. His weakness and fatigue is more significant and his performance status has declined. He has a poor appetite. He continues to have shortness of breath and chronic cough. He denies any fevers. He has no neurologic complaints. He has no chest pain. He denies any nausea, vomiting, constipation, or diarrhea. He has no urinary complaints. Patient offers no further specific complaints.  REVIEW OF SYSTEMS:   Review of Systems  Constitutional: Negative for fever and weight loss.  Respiratory: Positive for cough and shortness of breath. Negative for hemoptysis.   Cardiovascular: Negative.   Gastrointestinal: Negative.     As per HPI. Otherwise, a complete review of systems is negatve.  PAST MEDICAL HISTORY: Past Medical History  Diagnosis Date  . Hyposmolality and/or hyponatremia   . Other and unspecified hyperlipidemia   . Headache(784.0)   . Backache, unspecified   . Allergic rhinitis, cause unspecified   . Tobacco use disorder   . Other constipation   . Myocardial infarction   . Hypertension   . Shortness of breath     with exertion, pt. remarks that he "gives out more from his legs than his breathing"   . Stroke     [facility]- pt. told he had a stroke, but he doesn't think he did   . Peripheral vascular disease, unspecified   . GERD (gastroesophageal reflux disease)     indigestion on  occasion   . Arthritis     back, legs, knees   . A-fib   . Kidney stones     right kidney  . Coronary artery disease   . Dysrhythmia     a fib  . 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 COPD (chronic obstructive pulmonary disease)     PAST SURGICAL HISTORY: Past Surgical History  Procedure Laterality Date  . Hemorroidectomy    . Cardiac catheterization  2014    [facility]  . Knee surgery  1990's    [facility]- Left knee, cartilage removed   . Multiple tooth extractions  1980's  . Coronary artery bypass graft N/A 08/13/2013    Procedure: CORONARY ARTERY BYPASS GRAFTING (CABG);  Surgeon: Melrose Nakayama, MD;  Location: [place];  Service: Open Heart Surgery;  Laterality: N/A;  CABG x three,  using left internal mammary artery and right leg greater saphenous vein harvested endoscopically    FAMILY HISTORY Family History  Problem Relation Age of Onset  . Hypertension Mother        ADVANCED DIRECTIVES:    HEALTH MAINTENANCE: History  Substance Use Topics  . Smoking status: Former Smoker -- 0.25 packs/day for 60 years    Types: Cigarettes    Quit date: 12/10/2013  . Smokeless tobacco: Never Used     Comment: down to 2-4 cigs a day now  . Alcohol Use: No     Comment: none for three months, reports he hasn't ever been a heavy drinker  Colonoscopy:  PAP:  Bone density:  Lipid panel:  No Known Allergies  Current Outpatient Prescriptions  Medication Sig Dispense Refill  . amiodarone (PACERONE) 200 MG tablet Take 1 tablet (200 mg total) by mouth 2 (two) times daily as needed. 60 tablet 3  . aspirin 162 MG EC tablet Take 162 mg by mouth daily.    Marland Kitchen gabapentin (NEURONTIN) 100 MG capsule Take 200 mg by mouth 3 (three) times daily.    Marland Kitchen HYDROcodone-acetaminophen (NORCO/VICODIN) 5-325 MG per tablet TK 1 T PO Q 6 H PRN FOR MODERATE PAIN.  0  . loratadine (CLARITIN) 10 MG tablet Take 10 mg by mouth 2 (two) times daily.    . metoprolol tartrate (LOPRESSOR) 25 MG tablet  Take 1 tablet (25 mg total) by mouth 2 (two) times daily. (Patient taking differently: Take 12.5 mg by mouth 2 (two) times daily. ) 60 tablet 12  . nitroGLYCERIN (NITROSTAT) 0.4 MG SL tablet Place 1 tablet (0.4 mg total) under the tongue every 5 (five) minutes as needed. 25 tablet 12  . prochlorperazine (COMPAZINE) 10 MG tablet Take 1 tablet (10 mg total) by mouth every 6 (six) hours as needed for nausea or vomiting. 120 tablet 1  . sucralfate (CARAFATE) 1 G tablet Take 1 tablet (1 g total) by mouth 4 (four) times daily -  with meals and at bedtime. 90 tablet 2  . triamterene-hydrochlorothiazide (DYAZIDE) 37.5-25 MG per capsule Take by mouth.     Current Facility-Administered Medications  Medication Dose Route Frequency Provider Last Rate Last Dose  . 0.9 %  sodium chloride infusion   Intravenous Once Lloyd Huger, MD        OBJECTIVE: Filed Vitals:   04/02/15 1003  BP: 98/62  Pulse: 68  Temp: 98.6 F (37 C)  Resp: 24     Body mass index is 24.17 kg/(m^2).    ECOG FS:2 - Symptomatic, <50% confined to bed  General: Well-developed, well-nourished, no acute distress. Eyes: anicteric sclera. Lungs: Clear to auscultation bilaterally. Heart: Regular rate and rhythm. No rubs, murmurs, or gallops. Abdomen: Soft, nontender, nondistended. No organomegaly noted, normoactive bowel sounds. Musculoskeletal: No edema, cyanosis, or clubbing. Neuro: Alert, answering all questions appropriately. Cranial nerves grossly intact. Skin: No rashes or petechiae noted. Psych: Normal affect.   LAB RESULTS:  Lab Results  Component Value Date   NA 134* 04/02/2015   K 4.2 04/02/2015   CL 105 04/02/2015   CO2 24 04/02/2015   GLUCOSE 106* 04/02/2015   BUN 26* 04/02/2015   CREATININE 0.97 04/02/2015   CALCIUM 7.9* 04/02/2015   PROT 6.1* 04/02/2015   ALBUMIN 3.2* 04/02/2015   AST 18 04/02/2015   ALT 27 04/02/2015   ALKPHOS 75 04/02/2015   BILITOT 1.2 04/02/2015   GFRNONAA >60 04/02/2015    GFRAA >60 04/02/2015    Lab Results  Component Value Date   WBC 2.8* 04/02/2015   NEUTROABS 1.9 04/02/2015   HGB 10.0* 04/02/2015   HCT 28.9* 04/02/2015   MCV 112.3* 04/02/2015   PLT 133* 04/02/2015     STUDIES: PET scan completed January 22, 2015.  1. Intensely hypermetabolic left suprahilar mass within the central left upper lobe is most consists with bronchogenic carcinoma. 2. Moderately hypermetabolic nodular consolidation in the left lower lobe is more dense than 03/04/2014. Favor a second primary low-grade adenocarcinoma for this lesion. 3. Single inflamed diverticulum of the sigmoid colon.   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 Patient  also has a suspicious nodule of the left lower lobe.  PLAN:    1. Lung cancer: Patient is not a surgical candidate. Delay cycle 2 of weekly carboplatinum and Taxol secondary to decreased performance status. Continue daily XRT. Will consider consolidation chemotherapy at the conclusion of his treatment, but given his advanced age and multiple medical conditions, will likely defer this option. Patient does not wish port placement at this time, but will consider one in the future. Return to clinic in approximately in 1 week for reconsideration of cycle 2.  2. Decreased performance status:  Will receive 1 L of IV fluids today.  Patient expressed understanding and was in agreement with this plan. He also understands that He can call clinic at any time with any questions, concerns, or complaints.   No matching staging information was found for the patient.  Lloyd Huger, MD   04/04/2015 4:52 PM

## 2015-04-04 NOTE — Telephone Encounter (Signed)
Sharyn Lull call stating she needs a new [facility] referral for the pt. The pt will be seeing Dr Oliva Bustard onTueday 04/09/15 dx code: C34.12   She will be going on vacation but you can reach Luanne [phone] for further information needed.

## 2015-04-07 ENCOUNTER — Ambulatory Visit
Admission: RE | Admit: 2015-04-07 | Discharge: 2015-04-07 | Disposition: A | Discharge status: Home / Self Care | Payer: Commercial Managed Care - HMO | Source: Ambulatory Visit | Attending: Radiation Oncology | Admitting: Radiation Oncology

## 2015-04-07 ENCOUNTER — Ambulatory Visit: Payer: Commercial Managed Care - HMO

## 2015-04-07 DIAGNOSIS — Z510 Encounter for antineoplastic radiation therapy: Secondary | ICD-10-CM | POA: Diagnosis not present

## 2015-04-08 ENCOUNTER — Ambulatory Visit: Payer: Commercial Managed Care - HMO

## 2015-04-08 ENCOUNTER — Ambulatory Visit
Admission: RE | Admit: 2015-04-08 | Discharge: 2015-04-08 | Disposition: A | Discharge status: Home / Self Care | Payer: Commercial Managed Care - HMO | Source: Ambulatory Visit | Attending: Radiation Oncology | Admitting: Radiation Oncology

## 2015-04-08 DIAGNOSIS — Z510 Encounter for antineoplastic radiation therapy: Secondary | ICD-10-CM | POA: Diagnosis not present

## 2015-04-08 NOTE — Telephone Encounter (Signed)
Will enter in portal.JH

## 2015-04-09 ENCOUNTER — Ambulatory Visit
Admission: RE | Admit: 2015-04-09 | Discharge: 2015-04-09 | Disposition: A | Discharge status: Home / Self Care | Payer: Commercial Managed Care - HMO | Source: Ambulatory Visit | Attending: Radiation Oncology | Admitting: Radiation Oncology

## 2015-04-09 ENCOUNTER — Ambulatory Visit: Payer: Commercial Managed Care - HMO

## 2015-04-09 ENCOUNTER — Other Ambulatory Visit: Payer: Commercial Managed Care - HMO

## 2015-04-09 ENCOUNTER — Inpatient Hospital Stay (HOSPITAL_BASED_OUTPATIENT_CLINIC_OR_DEPARTMENT_OTHER): Payer: Commercial Managed Care - HMO | Admitting: Oncology

## 2015-04-09 ENCOUNTER — Ambulatory Visit: Payer: Commercial Managed Care - HMO | Admitting: Family Medicine

## 2015-04-09 ENCOUNTER — Inpatient Hospital Stay: Payer: Commercial Managed Care - HMO

## 2015-04-09 VITALS — BP 105/68 | HR 67 | Temp 97.0°F | Wt 158.7 lb

## 2015-04-09 DIAGNOSIS — I4891 Unspecified atrial fibrillation: Secondary | ICD-10-CM

## 2015-04-09 DIAGNOSIS — M129 Arthropathy, unspecified: Secondary | ICD-10-CM

## 2015-04-09 DIAGNOSIS — K5730 Diverticulosis of large intestine without perforation or abscess without bleeding: Secondary | ICD-10-CM

## 2015-04-09 DIAGNOSIS — R05 Cough: Secondary | ICD-10-CM | POA: Diagnosis not present

## 2015-04-09 DIAGNOSIS — R0602 Shortness of breath: Secondary | ICD-10-CM | POA: Diagnosis not present

## 2015-04-09 DIAGNOSIS — R630 Anorexia: Secondary | ICD-10-CM

## 2015-04-09 DIAGNOSIS — Z7982 Long term (current) use of aspirin: Secondary | ICD-10-CM

## 2015-04-09 DIAGNOSIS — C3490 Malignant neoplasm of unspecified part of unspecified bronchus or lung: Secondary | ICD-10-CM

## 2015-04-09 DIAGNOSIS — R079 Chest pain, unspecified: Secondary | ICD-10-CM

## 2015-04-09 DIAGNOSIS — K219 Gastro-esophageal reflux disease without esophagitis: Secondary | ICD-10-CM

## 2015-04-09 DIAGNOSIS — I2510 Atherosclerotic heart disease of native coronary artery without angina pectoris: Secondary | ICD-10-CM

## 2015-04-09 DIAGNOSIS — Z5111 Encounter for antineoplastic chemotherapy: Secondary | ICD-10-CM | POA: Diagnosis not present

## 2015-04-09 DIAGNOSIS — T451X5S Adverse effect of antineoplastic and immunosuppressive drugs, sequela: Secondary | ICD-10-CM

## 2015-04-09 DIAGNOSIS — K5900 Constipation, unspecified: Secondary | ICD-10-CM

## 2015-04-09 DIAGNOSIS — F17210 Nicotine dependence, cigarettes, uncomplicated: Secondary | ICD-10-CM

## 2015-04-09 DIAGNOSIS — R531 Weakness: Secondary | ICD-10-CM

## 2015-04-09 DIAGNOSIS — C3412 Malignant neoplasm of upper lobe, left bronchus or lung: Secondary | ICD-10-CM

## 2015-04-09 DIAGNOSIS — I252 Old myocardial infarction: Secondary | ICD-10-CM

## 2015-04-09 DIAGNOSIS — R5383 Other fatigue: Secondary | ICD-10-CM | POA: Diagnosis not present

## 2015-04-09 DIAGNOSIS — E785 Hyperlipidemia, unspecified: Secondary | ICD-10-CM

## 2015-04-09 DIAGNOSIS — I10 Essential (primary) hypertension: Secondary | ICD-10-CM

## 2015-04-09 DIAGNOSIS — Z87442 Personal history of urinary calculi: Secondary | ICD-10-CM

## 2015-04-09 DIAGNOSIS — I739 Peripheral vascular disease, unspecified: Secondary | ICD-10-CM

## 2015-04-09 DIAGNOSIS — Z8673 Personal history of transient ischemic attack (TIA), and cerebral infarction without residual deficits: Secondary | ICD-10-CM

## 2015-04-09 DIAGNOSIS — Z510 Encounter for antineoplastic radiation therapy: Secondary | ICD-10-CM | POA: Diagnosis not present

## 2015-04-09 DIAGNOSIS — Z79899 Other long term (current) drug therapy: Secondary | ICD-10-CM

## 2015-04-09 LAB — CBC WITH DIFFERENTIAL/PLATELET
BASOS PCT: 1 %
Basophils Absolute: 0.1 10*3/uL (ref 0–0.1)
EOS ABS: 0.1 10*3/uL (ref 0–0.7)
EOS PCT: 3 %
HCT: 28.7 % — ABNORMAL LOW (ref 40.0–52.0)
Hemoglobin: 9.9 g/dL — ABNORMAL LOW (ref 13.0–18.0)
Lymphocytes Relative: 21 %
Lymphs Abs: 0.8 10*3/uL — ABNORMAL LOW (ref 1.0–3.6)
MCH: 38.6 pg — AB (ref 26.0–34.0)
MCHC: 34.3 g/dL (ref 32.0–36.0)
MCV: 112.4 fL — AB (ref 80.0–100.0)
Monocytes Absolute: 0.2 10*3/uL (ref 0.2–1.0)
Monocytes Relative: 6 %
Neutro Abs: 2.7 10*3/uL (ref 1.4–6.5)
Neutrophils Relative %: 69 %
PLATELETS: 182 10*3/uL (ref 150–440)
RBC: 2.56 MIL/uL — AB (ref 4.40–5.90)
RDW: 15.5 % — ABNORMAL HIGH (ref 11.5–14.5)
WBC: 3.9 10*3/uL (ref 3.8–10.6)

## 2015-04-09 LAB — COMPREHENSIVE METABOLIC PANEL
ALBUMIN: 3.2 g/dL — AB (ref 3.5–5.0)
ALT: 13 U/L — AB (ref 17–63)
ANION GAP: 5 (ref 5–15)
AST: 11 U/L — AB (ref 15–41)
Alkaline Phosphatase: 75 U/L (ref 38–126)
BILIRUBIN TOTAL: 0.6 mg/dL (ref 0.3–1.2)
BUN: 14 mg/dL (ref 6–20)
CO2: 26 mmol/L (ref 22–32)
Calcium: 8.4 mg/dL — ABNORMAL LOW (ref 8.9–10.3)
Chloride: 103 mmol/L (ref 101–111)
Creatinine, Ser: 0.84 mg/dL (ref 0.61–1.24)
GFR calc Af Amer: >60 mL/min (ref >60)
GFR calc non Af Amer: >60 mL/min (ref >60)
Glucose, Bld: 107 mg/dL — ABNORMAL HIGH (ref 65–99)
Potassium: 4.7 mmol/L (ref 3.5–5.1)
SODIUM: 134 mmol/L — AB (ref 135–145)
TOTAL PROTEIN: 6.2 g/dL — AB (ref 6.5–8.1)

## 2015-04-09 NOTE — Progress Notes (Signed)
Patient does not have living will.  Former smoker. 

## 2015-04-10 ENCOUNTER — Ambulatory Visit: Payer: Commercial Managed Care - HMO

## 2015-04-10 ENCOUNTER — Inpatient Hospital Stay (HOSPITAL_BASED_OUTPATIENT_CLINIC_OR_DEPARTMENT_OTHER): Payer: Commercial Managed Care - HMO

## 2015-04-10 VITALS — BP 111/71 | HR 70 | Temp 96.0°F

## 2015-04-10 DIAGNOSIS — Z5111 Encounter for antineoplastic chemotherapy: Secondary | ICD-10-CM | POA: Diagnosis not present

## 2015-04-10 DIAGNOSIS — C3412 Malignant neoplasm of upper lobe, left bronchus or lung: Secondary | ICD-10-CM

## 2015-04-10 MED ORDER — SODIUM CHLORIDE 0.9 % IV SOLN
Freq: Once | INTRAVENOUS | Status: AC
  Administered 2015-04-10: 11:00:00 via INTRAVENOUS
  Filled 2015-04-10: qty 1000

## 2015-04-10 MED ORDER — FAMOTIDINE IN NACL 20-0.9 MG/50ML-% IV SOLN
20.0000 mg | Freq: Once | INTRAVENOUS | Status: AC
Start: 2015-04-10 — End: 2015-04-10
  Administered 2015-04-10: 20 mg via INTRAVENOUS
  Filled 2015-04-10: qty 50

## 2015-04-10 MED ORDER — SODIUM CHLORIDE 0.9 % IV SOLN
Freq: Once | INTRAVENOUS | Status: AC
  Administered 2015-04-10: 11:00:00 via INTRAVENOUS
  Filled 2015-04-10: qty 8

## 2015-04-10 MED ORDER — CARBOPLATIN CHEMO INJECTION 450 MG/45ML
170.0000 mg | Freq: Once | INTRAVENOUS | Status: DC
  Filled 2015-04-10: qty 17

## 2015-04-10 MED ORDER — SODIUM CHLORIDE 0.9 % IV SOLN: 182.4000 mg | Freq: Once | INTRAVENOUS | Status: DC

## 2015-04-10 MED ORDER — PACLITAXEL CHEMO INJECTION 300 MG/50ML
45.0000 mg/m2 | Freq: Once | INTRAVENOUS | Status: AC
  Administered 2015-04-10: 84 mg via INTRAVENOUS
  Filled 2015-04-10: qty 14

## 2015-04-10 MED ORDER — DIPHENHYDRAMINE HCL 50 MG/ML IJ SOLN
25.0000 mg | Freq: Once | INTRAMUSCULAR | Status: AC
  Administered 2015-04-10: 25 mg via INTRAVENOUS
  Filled 2015-04-10: qty 1

## 2015-04-10 NOTE — Progress Notes (Signed)
Pt noted to have SOB, chest pain. Taxotere stopped at 1155. Benadryl, solucortef given. Per Dr Oliva Bustard, no further tx today. VSS. Pain relieved after taxotere stopped. Pt ate small amount lunch and sent home with instruction to go to ED with pain reoccurs. Pt and family understood

## 2015-04-11 ENCOUNTER — Ambulatory Visit: Payer: Commercial Managed Care - HMO

## 2015-04-14 ENCOUNTER — Other Ambulatory Visit: Payer: Self-pay | Admitting: Oncology

## 2015-04-14 ENCOUNTER — Ambulatory Visit
Admission: RE | Admit: 2015-04-14 | Discharge: 2015-04-14 | Disposition: A | Discharge status: Home / Self Care | Payer: Commercial Managed Care - HMO | Source: Ambulatory Visit | Attending: Radiation Oncology | Admitting: Radiation Oncology

## 2015-04-14 ENCOUNTER — Ambulatory Visit: Payer: Commercial Managed Care - HMO

## 2015-04-14 DIAGNOSIS — Z510 Encounter for antineoplastic radiation therapy: Secondary | ICD-10-CM | POA: Diagnosis not present

## 2015-04-15 ENCOUNTER — Ambulatory Visit: Payer: Commercial Managed Care - HMO

## 2015-04-15 ENCOUNTER — Ambulatory Visit
Admission: RE | Admit: 2015-04-15 | Discharge: 2015-04-15 | Disposition: A | Discharge status: Home / Self Care | Payer: Commercial Managed Care - HMO | Source: Ambulatory Visit | Attending: Radiation Oncology | Admitting: Radiation Oncology

## 2015-04-15 DIAGNOSIS — Z510 Encounter for antineoplastic radiation therapy: Secondary | ICD-10-CM | POA: Diagnosis not present

## 2015-04-16 ENCOUNTER — Ambulatory Visit: Payer: Commercial Managed Care - HMO | Admitting: Oncology

## 2015-04-16 ENCOUNTER — Ambulatory Visit
Admission: RE | Admit: 2015-04-16 | Discharge: 2015-04-16 | Disposition: A | Discharge status: Home / Self Care | Payer: Commercial Managed Care - HMO | Source: Ambulatory Visit | Attending: Radiation Oncology | Admitting: Radiation Oncology

## 2015-04-16 ENCOUNTER — Ambulatory Visit: Payer: Commercial Managed Care - HMO

## 2015-04-16 ENCOUNTER — Other Ambulatory Visit: Payer: Commercial Managed Care - HMO

## 2015-04-16 DIAGNOSIS — Z510 Encounter for antineoplastic radiation therapy: Secondary | ICD-10-CM | POA: Diagnosis not present

## 2015-04-17 ENCOUNTER — Ambulatory Visit: Payer: Self-pay | Admitting: Family Medicine

## 2015-04-17 ENCOUNTER — Ambulatory Visit: Payer: Commercial Managed Care - HMO

## 2015-04-17 ENCOUNTER — Inpatient Hospital Stay (HOSPITAL_BASED_OUTPATIENT_CLINIC_OR_DEPARTMENT_OTHER): Payer: Commercial Managed Care - HMO | Admitting: Oncology

## 2015-04-17 ENCOUNTER — Telehealth: Payer: Self-pay

## 2015-04-17 ENCOUNTER — Inpatient Hospital Stay: Payer: Commercial Managed Care - HMO

## 2015-04-17 ENCOUNTER — Ambulatory Visit
Admission: RE | Admit: 2015-04-17 | Discharge: 2015-04-17 | Disposition: A | Discharge status: Home / Self Care | Payer: Commercial Managed Care - HMO | Source: Ambulatory Visit | Attending: Radiation Oncology | Admitting: Radiation Oncology

## 2015-04-17 VITALS — BP 114/67 | HR 64 | Temp 97.5°F | Resp 18 | Ht 68.0 in | Wt 158.6 lb

## 2015-04-17 DIAGNOSIS — C3412 Malignant neoplasm of upper lobe, left bronchus or lung: Secondary | ICD-10-CM | POA: Diagnosis not present

## 2015-04-17 DIAGNOSIS — K5900 Constipation, unspecified: Secondary | ICD-10-CM

## 2015-04-17 DIAGNOSIS — I10 Essential (primary) hypertension: Secondary | ICD-10-CM

## 2015-04-17 DIAGNOSIS — R05 Cough: Secondary | ICD-10-CM

## 2015-04-17 DIAGNOSIS — E785 Hyperlipidemia, unspecified: Secondary | ICD-10-CM

## 2015-04-17 DIAGNOSIS — F17210 Nicotine dependence, cigarettes, uncomplicated: Secondary | ICD-10-CM

## 2015-04-17 DIAGNOSIS — R531 Weakness: Secondary | ICD-10-CM

## 2015-04-17 DIAGNOSIS — R0602 Shortness of breath: Secondary | ICD-10-CM

## 2015-04-17 DIAGNOSIS — Z8673 Personal history of transient ischemic attack (TIA), and cerebral infarction without residual deficits: Secondary | ICD-10-CM

## 2015-04-17 DIAGNOSIS — R630 Anorexia: Secondary | ICD-10-CM

## 2015-04-17 DIAGNOSIS — I2510 Atherosclerotic heart disease of native coronary artery without angina pectoris: Secondary | ICD-10-CM

## 2015-04-17 DIAGNOSIS — M129 Arthropathy, unspecified: Secondary | ICD-10-CM

## 2015-04-17 DIAGNOSIS — Z79899 Other long term (current) drug therapy: Secondary | ICD-10-CM

## 2015-04-17 DIAGNOSIS — I4891 Unspecified atrial fibrillation: Secondary | ICD-10-CM

## 2015-04-17 DIAGNOSIS — Z7982 Long term (current) use of aspirin: Secondary | ICD-10-CM

## 2015-04-17 DIAGNOSIS — R5383 Other fatigue: Secondary | ICD-10-CM | POA: Diagnosis not present

## 2015-04-17 DIAGNOSIS — I252 Old myocardial infarction: Secondary | ICD-10-CM

## 2015-04-17 DIAGNOSIS — Z87442 Personal history of urinary calculi: Secondary | ICD-10-CM

## 2015-04-17 DIAGNOSIS — K5730 Diverticulosis of large intestine without perforation or abscess without bleeding: Secondary | ICD-10-CM

## 2015-04-17 DIAGNOSIS — I739 Peripheral vascular disease, unspecified: Secondary | ICD-10-CM

## 2015-04-17 DIAGNOSIS — Z5111 Encounter for antineoplastic chemotherapy: Secondary | ICD-10-CM | POA: Diagnosis not present

## 2015-04-17 DIAGNOSIS — K219 Gastro-esophageal reflux disease without esophagitis: Secondary | ICD-10-CM

## 2015-04-17 DIAGNOSIS — Z510 Encounter for antineoplastic radiation therapy: Secondary | ICD-10-CM | POA: Diagnosis not present

## 2015-04-17 LAB — CBC WITH DIFFERENTIAL/PLATELET
Basophils Absolute: 0.1 10*3/uL (ref 0–0.1)
Basophils Relative: 1 %
Eosinophils Absolute: 0.1 10*3/uL (ref 0–0.7)
Eosinophils Relative: 3 %
HCT: 31.4 % — ABNORMAL LOW (ref 40.0–52.0)
HEMOGLOBIN: 10.6 g/dL — AB (ref 13.0–18.0)
LYMPHS ABS: 0.5 10*3/uL — AB (ref 1.0–3.6)
Lymphocytes Relative: 10 %
MCH: 38.5 pg — ABNORMAL HIGH (ref 26.0–34.0)
MCHC: 33.6 g/dL (ref 32.0–36.0)
MCV: 114.5 fL — AB (ref 80.0–100.0)
MONOS PCT: 7 %
Monocytes Absolute: 0.4 10*3/uL (ref 0.2–1.0)
NEUTROS ABS: 4.1 10*3/uL (ref 1.4–6.5)
NEUTROS PCT: 79 %
PLATELETS: 146 10*3/uL — AB (ref 150–440)
RBC: 2.74 MIL/uL — AB (ref 4.40–5.90)
RDW: 16.4 % — AB (ref 11.5–14.5)
WBC: 5.2 10*3/uL (ref 3.8–10.6)

## 2015-04-17 LAB — COMPREHENSIVE METABOLIC PANEL
ALBUMIN: 3.5 g/dL (ref 3.5–5.0)
ALT: 9 U/L — ABNORMAL LOW (ref 17–63)
AST: 13 U/L — AB (ref 15–41)
Alkaline Phosphatase: 84 U/L (ref 38–126)
Anion gap: 6 (ref 5–15)
BUN: 15 mg/dL (ref 6–20)
CO2: 24 mmol/L (ref 22–32)
Calcium: 8.3 mg/dL — ABNORMAL LOW (ref 8.9–10.3)
Chloride: 106 mmol/L (ref 101–111)
Creatinine, Ser: 0.98 mg/dL (ref 0.61–1.24)
GFR calc Af Amer: >60 mL/min (ref >60)
GFR calc non Af Amer: >60 mL/min (ref >60)
Glucose, Bld: 107 mg/dL — ABNORMAL HIGH (ref 65–99)
Potassium: 4.8 mmol/L (ref 3.5–5.1)
Sodium: 136 mmol/L (ref 135–145)
Total Bilirubin: 0.7 mg/dL (ref 0.3–1.2)
Total Protein: 6.6 g/dL (ref 6.5–8.1)

## 2015-04-17 MED ORDER — FAMOTIDINE IN NACL 20-0.9 MG/50ML-% IV SOLN: 20.0000 mg | Freq: Once | INTRAVENOUS | Status: DC

## 2015-04-17 MED ORDER — SODIUM CHLORIDE 0.9 % IV SOLN
170.0000 mg | Freq: Once | INTRAVENOUS | Status: AC
  Administered 2015-04-17: 170 mg via INTRAVENOUS
  Filled 2015-04-17: qty 17

## 2015-04-17 MED ORDER — SODIUM CHLORIDE 0.9 % IV SOLN
Freq: Once | INTRAVENOUS | Status: AC
  Administered 2015-04-17: 11:00:00 via INTRAVENOUS
  Filled 2015-04-17: qty 1000

## 2015-04-17 MED ORDER — DIPHENHYDRAMINE HCL 50 MG/ML IJ SOLN
25.0000 mg | Freq: Once | INTRAMUSCULAR | Status: AC
  Administered 2015-04-17: 25 mg via INTRAVENOUS
  Filled 2015-04-17: qty 1

## 2015-04-17 MED ORDER — HEPARIN SOD (PORK) LOCK FLUSH 100 UNIT/ML IV SOLN: 500.0000 [IU] | Freq: Once | INTRAVENOUS | Status: DC | PRN

## 2015-04-17 MED ORDER — SODIUM CHLORIDE 0.9 % IV SOLN
Freq: Once | INTRAVENOUS | Status: AC
  Administered 2015-04-17: 12:00:00 via INTRAVENOUS
  Filled 2015-04-17: qty 8

## 2015-04-17 NOTE — Telephone Encounter (Signed)
Patient only getting Carbo today, no taxol (possible reaction last time).

## 2015-04-18 ENCOUNTER — Ambulatory Visit
Admission: RE | Admit: 2015-04-18 | Discharge: 2015-04-18 | Disposition: A | Discharge status: Home / Self Care | Payer: Commercial Managed Care - HMO | Source: Ambulatory Visit | Attending: Radiation Oncology | Admitting: Radiation Oncology

## 2015-04-18 ENCOUNTER — Telehealth: Payer: Self-pay | Admitting: Pharmacist

## 2015-04-18 ENCOUNTER — Ambulatory Visit: Payer: Commercial Managed Care - HMO

## 2015-04-18 DIAGNOSIS — Z510 Encounter for antineoplastic radiation therapy: Secondary | ICD-10-CM | POA: Diagnosis not present

## 2015-04-18 NOTE — Telephone Encounter (Signed)
Spoke with MD. Patient had possible reaction to taxol. Patient will be receiving only carboplatin from now on.

## 2015-04-19 ENCOUNTER — Encounter: Payer: Self-pay | Admitting: Oncology

## 2015-04-19 NOTE — Progress Notes (Signed)
I was called to see this patient because of severe reaction to paclitaxel. During administration of paclitaxel patient developed increasing shortness of breath chest discomfort.  Became hypoxic and tachycardic. Taxol was discontinued patient received Benadryl and Solu-Cortef.  Patient was frequently seen in the infusion center vital signs were monitored. Gradually patient's condition improved.  Chest pain resolved shortness of breath resolved. Patient received carboplatinum Taxol will be discontinued and chemotherapy would be changed by Dr. Grayland Ormond 9 discussed that situation with Dr. Grayland Ormond.  Total duration of time was spent was 40 minutes in the infusion center

## 2015-04-19 NOTE — Addendum Note (Signed)
Addended by: Forest Gleason on: 04/19/2015 09:32 AM   Modules accepted: Level of Service

## 2015-04-19 NOTE — Progress Notes (Signed)
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 Patient also has a suspicious nodule of the left lower lobe.  INTERVAL HISTORY: Patient returns to clinic today for further evaluation and consideration of cycle 2 of concurrent carboplatinum and Taxol with XRT. His weakness and fatigue is more significant and his performance status has declined. He has a poor appetite. He continues to have shortness of breath and chronic cough. He denies any fevers. He has no neurologic complaints. He has no chest pain. He denies any nausea, vomiting, constipation, or diarrhea. He has no urinary complaints. Patient offers no further specific complaints. April 09, 2015 Patient is being evaluated in absence of Dr. Grayland Ormond for carcinoma of lung, presently on chemoradiation therapy.  Eating radiation therapy.  Continues to cough feeling weak and tired somewhat depressed individual.  No hemoptysis.  Appetite is poor.  No tingling.  No numbness.  REVIEW OF SYSTEMS:   ROS  Gen. status: Patient is feeling weak and tired. HEENT: No headache dizziness. Lungs: Shortness of breath cough continues. Cardiac: No chest pain.  No paroxysmal nocturnal dyspnea. GI: Poor appetite.  No nausea no vomiting no diarrhea.  Lower extremity intermittent swelling. Skin: No rash Neurological system no tingling or numbness Psychiatric system: Depression  As per HPI. Otherwise, a complete review of systems is negatve.  PAST MEDICAL HISTORY: Past Medical History  Diagnosis Date  . Hyposmolality and/or hyponatremia   . Other and unspecified hyperlipidemia   . Headache(784.0)   . Backache,  unspecified   . Allergic rhinitis, cause unspecified   . Tobacco use disorder   . Other constipation   . Myocardial infarction   . Hypertension   . Shortness of breath     with exertion, pt. remarks that he "gives out more from his legs than his breathing"   . Stroke     [facility]- pt. told he had a stroke, but he doesn't think he did   . Peripheral vascular disease, unspecified   . GERD (gastroesophageal reflux disease)     indigestion on occasion   . Arthritis     back, legs, knees   . A-fib   . Kidney stones     right kidney  . Coronary artery disease   . Dysrhythmia     a fib  . 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 COPD (chronic obstructive pulmonary disease)     PAST SURGICAL HISTORY: Past Surgical History  Procedure Laterality Date  . Hemorroidectomy    . Cardiac catheterization  2014    [facility]  . Knee surgery  1990's    HPR- Left knee, cartilage removed   . Multiple tooth extractions  1980's  . Coronary artery bypass graft N/A 08/13/2013    Procedure: CORONARY ARTERY BYPASS GRAFTING (CABG);  Surgeon: Melrose Nakayama, MD;  Location: [place];  Service: Open Heart Surgery;  Laterality: N/A;  CABG x three,  using left internal mammary artery and right leg greater saphenous vein harvested endoscopically    FAMILY HISTORY Family History  Problem Relation Age of Onset  . Hypertension Mother        ADVANCED DIRECTIVES:  Patient does not have any  advance healthcare directive  HEALTH MAINTENANCE: History  Substance Use Topics  . Smoking status: Former Smoker -- 0.25 packs/day for 60 years    Types: Cigarettes    Quit date: 12/10/2013  . Smokeless tobacco: Never Used     Comment: down to 2-4 cigs a day now  . Alcohol Use: No     Comment: none for three months, reports he hasn't ever been a heavy drinker      No Known Allergies  Current Outpatient Prescriptions  Medication Sig Dispense Refill  . amiodarone (PACERONE) 200 MG tablet Take 1 tablet  (200 mg total) by mouth 2 (two) times daily as needed. 60 tablet 3  . aspirin 162 MG EC tablet Take 162 mg by mouth daily.    Marland Kitchen gabapentin (NEURONTIN) 100 MG capsule Take 200 mg by mouth 3 (three) times daily.    Marland Kitchen HYDROcodone-acetaminophen (NORCO/VICODIN) 5-325 MG per tablet TK 1 T PO Q 6 H PRN FOR MODERATE PAIN.  0  . loratadine (CLARITIN) 10 MG tablet Take 10 mg by mouth 2 (two) times daily.    . metoprolol tartrate (LOPRESSOR) 25 MG tablet Take 1 tablet (25 mg total) by mouth 2 (two) times daily. (Patient taking differently: Take 12.5 mg by mouth 2 (two) times daily. ) 60 tablet 12  . nitroGLYCERIN (NITROSTAT) 0.4 MG SL tablet Place 1 tablet (0.4 mg total) under the tongue every 5 (five) minutes as needed. 25 tablet 12  . prochlorperazine (COMPAZINE) 10 MG tablet Take 1 tablet (10 mg total) by mouth every 6 (six) hours as needed for nausea or vomiting. 120 tablet 1  . sucralfate (CARAFATE) 1 G tablet Take 1 tablet (1 g total) by mouth 4 (four) times daily -  with meals and at bedtime. 90 tablet 2  . triamterene-hydrochlorothiazide (DYAZIDE) 37.5-25 MG per capsule Take by mouth.     No current facility-administered medications for this visit.    OBJECTIVE: Filed Vitals:   04/09/15 1012  BP: 105/68  Pulse: 67  Temp: 97 F (36.1 C)     Body mass index is 24.14 kg/(m^2).    ECOG FS:2 - Symptomatic, <50% confined to bed  General: Well-developed, well-nourished, no acute distress. Eyes: anicteric sclera. Lungs: Clear to auscultation bilaterally. Heart: Regular rate and rhythm. No rubs, murmurs, or gallops. Abdomen: Soft, nontender, nondistended. No organomegaly noted, normoactive bowel sounds. Musculoskeletal: No edema, cyanosis, or clubbing. Neuro: Alert, answering all questions appropriately. Cranial nerves grossly intact. Skin: No rashes or petechiae noted. Psych: Normal affect.   LAB RESULTS:  Lab Results  Component Value Date   NA 136 04/17/2015   K 4.8 04/17/2015   CL 106  04/17/2015   CO2 24 04/17/2015   GLUCOSE 107* 04/17/2015   BUN 15 04/17/2015   CREATININE 0.98 04/17/2015   CALCIUM 8.3* 04/17/2015   PROT 6.6 04/17/2015   ALBUMIN 3.5 04/17/2015   AST 13* 04/17/2015   ALT 9* 04/17/2015   ALKPHOS 84 04/17/2015   BILITOT 0.7 04/17/2015   GFRNONAA >60 04/17/2015   GFRAA >60 04/17/2015    Lab Results  Component Value Date   WBC 5.2 04/17/2015   NEUTROABS 4.1 04/17/2015   HGB 10.6* 04/17/2015   HCT 31.4* 04/17/2015   MCV 114.5* 04/17/2015   PLT 146* 04/17/2015     STUDIES: PET scan completed January 22, 2015.  1. Intensely hypermetabolic left suprahilar mass within the central left upper lobe is most consists with bronchogenic carcinoma. 2. Moderately hypermetabolic nodular consolidation in the  left lower lobe is more dense than 03/04/2014. Favor a second primary low-grade adenocarcinoma for this lesion. 3. Single inflamed diverticulum of the sigmoid colon.   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 Patient also has a suspicious nodule of the left lower lobe. All lab data has been reviewed.  We will proceed with chemotherapy.  Dose of paclitaxel was changed to 45 mg/m instead of 80 mg/m as patient is getting concurrent radiation therapy  PLAN:    Continue dose adjusted chemotherapy.  Patient expressed understanding and was in agreement with this plan. He also understands that He can call clinic at any time with any questions, concerns, or complaints.   No matching staging information was found for the patient.  Forest Gleason, MD   04/19/2015 9:24 AM

## 2015-04-21 ENCOUNTER — Encounter: Payer: Self-pay | Admitting: Emergency Medicine

## 2015-04-21 ENCOUNTER — Inpatient Hospital Stay
Admission: EM | Admit: 2015-04-21 | Discharge: 2015-04-24 | DRG: 175 | Disposition: A | Discharge status: Home / Self Care | Payer: Commercial Managed Care - HMO | Attending: Specialist | Admitting: Specialist

## 2015-04-21 ENCOUNTER — Emergency Department: Payer: Commercial Managed Care - HMO

## 2015-04-21 DIAGNOSIS — Z951 Presence of aortocoronary bypass graft: Secondary | ICD-10-CM | POA: Diagnosis not present

## 2015-04-21 DIAGNOSIS — M1990 Unspecified osteoarthritis, unspecified site: Secondary | ICD-10-CM | POA: Diagnosis present

## 2015-04-21 DIAGNOSIS — J189 Pneumonia, unspecified organism: Secondary | ICD-10-CM | POA: Diagnosis present

## 2015-04-21 DIAGNOSIS — Y95 Nosocomial condition: Secondary | ICD-10-CM | POA: Diagnosis present

## 2015-04-21 DIAGNOSIS — J9801 Acute bronchospasm: Secondary | ICD-10-CM | POA: Diagnosis present

## 2015-04-21 DIAGNOSIS — I252 Old myocardial infarction: Secondary | ICD-10-CM

## 2015-04-21 DIAGNOSIS — T451X5A Adverse effect of antineoplastic and immunosuppressive drugs, initial encounter: Secondary | ICD-10-CM | POA: Diagnosis present

## 2015-04-21 DIAGNOSIS — R45851 Suicidal ideations: Secondary | ICD-10-CM | POA: Diagnosis present

## 2015-04-21 DIAGNOSIS — Z66 Do not resuscitate: Secondary | ICD-10-CM | POA: Diagnosis present

## 2015-04-21 DIAGNOSIS — C9110 Chronic lymphocytic leukemia of B-cell type not having achieved remission: Secondary | ICD-10-CM | POA: Diagnosis not present

## 2015-04-21 DIAGNOSIS — C3490 Malignant neoplasm of unspecified part of unspecified bronchus or lung: Secondary | ICD-10-CM

## 2015-04-21 DIAGNOSIS — D630 Anemia in neoplastic disease: Secondary | ICD-10-CM | POA: Diagnosis present

## 2015-04-21 DIAGNOSIS — I8290 Acute embolism and thrombosis of unspecified vein: Secondary | ICD-10-CM

## 2015-04-21 DIAGNOSIS — G629 Polyneuropathy, unspecified: Secondary | ICD-10-CM | POA: Diagnosis present

## 2015-04-21 DIAGNOSIS — I739 Peripheral vascular disease, unspecified: Secondary | ICD-10-CM | POA: Diagnosis present

## 2015-04-21 DIAGNOSIS — I2510 Atherosclerotic heart disease of native coronary artery without angina pectoris: Secondary | ICD-10-CM | POA: Diagnosis present

## 2015-04-21 DIAGNOSIS — Z510 Encounter for antineoplastic radiation therapy: Secondary | ICD-10-CM | POA: Diagnosis present

## 2015-04-21 DIAGNOSIS — K5900 Constipation, unspecified: Secondary | ICD-10-CM | POA: Diagnosis present

## 2015-04-21 DIAGNOSIS — K219 Gastro-esophageal reflux disease without esophagitis: Secondary | ICD-10-CM | POA: Diagnosis present

## 2015-04-21 DIAGNOSIS — B029 Zoster without complications: Secondary | ICD-10-CM | POA: Diagnosis present

## 2015-04-21 DIAGNOSIS — J449 Chronic obstructive pulmonary disease, unspecified: Secondary | ICD-10-CM | POA: Diagnosis present

## 2015-04-21 DIAGNOSIS — Z8249 Family history of ischemic heart disease and other diseases of the circulatory system: Secondary | ICD-10-CM | POA: Diagnosis not present

## 2015-04-21 DIAGNOSIS — R079 Chest pain, unspecified: Secondary | ICD-10-CM

## 2015-04-21 DIAGNOSIS — I482 Chronic atrial fibrillation: Secondary | ICD-10-CM | POA: Diagnosis present

## 2015-04-21 DIAGNOSIS — E785 Hyperlipidemia, unspecified: Secondary | ICD-10-CM | POA: Diagnosis present

## 2015-04-21 DIAGNOSIS — D649 Anemia, unspecified: Secondary | ICD-10-CM | POA: Diagnosis not present

## 2015-04-21 DIAGNOSIS — I2699 Other pulmonary embolism without acute cor pulmonale: Principal | ICD-10-CM | POA: Diagnosis present

## 2015-04-21 DIAGNOSIS — Z8673 Personal history of transient ischemic attack (TIA), and cerebral infarction without residual deficits: Secondary | ICD-10-CM | POA: Diagnosis not present

## 2015-04-21 DIAGNOSIS — F32A Depression, unspecified: Secondary | ICD-10-CM

## 2015-04-21 DIAGNOSIS — Z87442 Personal history of urinary calculi: Secondary | ICD-10-CM

## 2015-04-21 DIAGNOSIS — C3402 Malignant neoplasm of left main bronchus: Secondary | ICD-10-CM | POA: Diagnosis not present

## 2015-04-21 DIAGNOSIS — D6481 Anemia due to antineoplastic chemotherapy: Secondary | ICD-10-CM | POA: Diagnosis present

## 2015-04-21 DIAGNOSIS — F329 Major depressive disorder, single episode, unspecified: Secondary | ICD-10-CM | POA: Diagnosis present

## 2015-04-21 DIAGNOSIS — Z87891 Personal history of nicotine dependence: Secondary | ICD-10-CM

## 2015-04-21 DIAGNOSIS — I10 Essential (primary) hypertension: Secondary | ICD-10-CM | POA: Diagnosis present

## 2015-04-21 DIAGNOSIS — Z7982 Long term (current) use of aspirin: Secondary | ICD-10-CM | POA: Diagnosis not present

## 2015-04-21 DIAGNOSIS — Z79899 Other long term (current) drug therapy: Secondary | ICD-10-CM | POA: Diagnosis not present

## 2015-04-21 LAB — BASIC METABOLIC PANEL
ANION GAP: 11 (ref 5–15)
BUN: 19 mg/dL (ref 6–20)
CHLORIDE: 97 mmol/L — AB (ref 101–111)
CO2: 25 mmol/L (ref 22–32)
CREATININE: 0.91 mg/dL (ref 0.61–1.24)
Calcium: 8.5 mg/dL — ABNORMAL LOW (ref 8.9–10.3)
GFR calc non Af Amer: >60 mL/min (ref >60)
GLUCOSE: 132 mg/dL — AB (ref 65–99)
Potassium: 3.8 mmol/L (ref 3.5–5.1)
Sodium: 133 mmol/L — ABNORMAL LOW (ref 135–145)

## 2015-04-21 LAB — CBC
HEMATOCRIT: 27.9 % — AB (ref 40.0–52.0)
HEMOGLOBIN: 9.5 g/dL — AB (ref 13.0–18.0)
MCH: 39.3 pg — AB (ref 26.0–34.0)
MCHC: 34.1 g/dL (ref 32.0–36.0)
MCV: 115.2 fL — AB (ref 80.0–100.0)
Platelets: 156 10*3/uL (ref 150–440)
RBC: 2.42 MIL/uL — ABNORMAL LOW (ref 4.40–5.90)
RDW: 16.7 % — AB (ref 11.5–14.5)
WBC: 11.7 10*3/uL — ABNORMAL HIGH (ref 3.8–10.6)

## 2015-04-21 LAB — HEPATIC FUNCTION PANEL
ALBUMIN: 3 g/dL — AB (ref 3.5–5.0)
ALT: 9 U/L — AB (ref 17–63)
AST: 16 U/L (ref 15–41)
Alkaline Phosphatase: 80 U/L (ref 38–126)
Bilirubin, Direct: 0.3 mg/dL (ref 0.1–0.5)
Indirect Bilirubin: 0.9 mg/dL (ref 0.3–0.9)
TOTAL PROTEIN: 6.6 g/dL (ref 6.5–8.1)
Total Bilirubin: 1.2 mg/dL (ref 0.3–1.2)

## 2015-04-21 LAB — APTT: APTT: 45 s — AB (ref 24–36)

## 2015-04-21 LAB — PROTIME-INR
INR: 1.26
PROTHROMBIN TIME: 16 s — AB (ref 11.4–15.0)

## 2015-04-21 LAB — MAGNESIUM: Magnesium: 2 mg/dL (ref 1.7–2.4)

## 2015-04-21 LAB — TROPONIN I

## 2015-04-21 LAB — LIPASE, BLOOD: Lipase: 20 U/L — ABNORMAL LOW (ref 22–51)

## 2015-04-21 MED ORDER — TRIAMTERENE-HCTZ 37.5-25 MG PO CAPS
1.0000 | ORAL_CAPSULE | Freq: Every day | ORAL | Status: DC
  Administered 2015-04-22 – 2015-04-24 (×3): 1 via ORAL
  Filled 2015-04-21 (×5): qty 1

## 2015-04-21 MED ORDER — CEFTAZIDIME 2 G IJ SOLR
2.0000 g | Freq: Once | INTRAMUSCULAR | Status: AC
  Administered 2015-04-21: 2 g via INTRAVENOUS
  Filled 2015-04-21: qty 2

## 2015-04-21 MED ORDER — VANCOMYCIN HCL 10 G IV SOLR: 1.0000 g | Freq: Once | INTRAVENOUS | Status: AC

## 2015-04-21 MED ORDER — ASPIRIN EC 81 MG PO TBEC
162.0000 mg | DELAYED_RELEASE_TABLET | Freq: Every day | ORAL | Status: DC
  Administered 2015-04-22 – 2015-04-24 (×3): 162 mg via ORAL
  Filled 2015-04-21 (×3): qty 2

## 2015-04-21 MED ORDER — IPRATROPIUM-ALBUTEROL 0.5-2.5 (3) MG/3ML IN SOLN: 3.0000 mL | RESPIRATORY_TRACT | Status: DC | PRN

## 2015-04-21 MED ORDER — SODIUM CHLORIDE 0.9 % IV SOLN
Freq: Once | INTRAVENOUS | Status: AC
  Administered 2015-04-21: 16:00:00 via INTRAVENOUS

## 2015-04-21 MED ORDER — MORPHINE SULFATE 2 MG/ML IJ SOLN
2.0000 mg | INTRAMUSCULAR | Status: DC | PRN
  Administered 2015-04-21: 2 mg via INTRAVENOUS
  Filled 2015-04-21: qty 1

## 2015-04-21 MED ORDER — NITROGLYCERIN 0.4 MG SL SUBL: 0.4000 mg | SUBLINGUAL_TABLET | SUBLINGUAL | Status: DC | PRN

## 2015-04-21 MED ORDER — MORPHINE SULFATE 2 MG/ML IJ SOLN
INTRAMUSCULAR | Status: AC
  Administered 2015-04-21: 2 mg via INTRAVENOUS
  Filled 2015-04-21: qty 1

## 2015-04-21 MED ORDER — METOPROLOL TARTRATE 25 MG PO TABS
25.0000 mg | ORAL_TABLET | Freq: Two times a day (BID) | ORAL | Status: DC
  Administered 2015-04-21 – 2015-04-24 (×4): 25 mg via ORAL
  Filled 2015-04-21 (×4): qty 1

## 2015-04-21 MED ORDER — ACETAMINOPHEN 325 MG PO TABS: 650.0000 mg | ORAL_TABLET | Freq: Four times a day (QID) | ORAL | Status: DC | PRN

## 2015-04-21 MED ORDER — SODIUM CHLORIDE 0.9 % IJ SOLN: 3.0000 mL | INTRAMUSCULAR | Status: DC | PRN

## 2015-04-21 MED ORDER — AMIODARONE HCL 200 MG PO TABS
200.0000 mg | ORAL_TABLET | Freq: Two times a day (BID) | ORAL | Status: DC
  Administered 2015-04-21 – 2015-04-24 (×6): 200 mg via ORAL
  Filled 2015-04-21 (×6): qty 1

## 2015-04-21 MED ORDER — LEVOFLOXACIN IN D5W 750 MG/150ML IV SOLN
750.0000 mg | Freq: Once | INTRAVENOUS | Status: AC
  Administered 2015-04-21: 750 mg via INTRAVENOUS

## 2015-04-21 MED ORDER — SODIUM CHLORIDE 0.9 % IV BOLUS (SEPSIS)
500.0000 mL | INTRAVENOUS | Status: AC
  Administered 2015-04-21: 500 mL via INTRAVENOUS

## 2015-04-21 MED ORDER — IOHEXOL 350 MG/ML SOLN
75.0000 mL | Freq: Once | INTRAVENOUS | Status: AC | PRN
  Administered 2015-04-21: 75 mL via INTRAVENOUS

## 2015-04-21 MED ORDER — HEPARIN (PORCINE) IN NACL 100-0.45 UNIT/ML-% IJ SOLN
17.0000 [IU]/kg/h | INTRAMUSCULAR | Status: DC
  Administered 2015-04-21: 17 [IU]/kg/h via INTRAVENOUS
  Filled 2015-04-21 (×2): qty 250

## 2015-04-21 MED ORDER — VANCOMYCIN HCL IN DEXTROSE 1-5 GM/200ML-% IV SOLN
INTRAVENOUS | Status: AC
  Administered 2015-04-21: 1 g
  Filled 2015-04-21: qty 200

## 2015-04-21 MED ORDER — SUCRALFATE 1 G PO TABS
1.0000 g | ORAL_TABLET | Freq: Three times a day (TID) | ORAL | Status: DC
Start: 2015-04-21 — End: 2015-04-24
  Administered 2015-04-21 – 2015-04-24 (×13): 1 g via ORAL
  Filled 2015-04-21 (×13): qty 1

## 2015-04-21 MED ORDER — LEVOFLOXACIN IN D5W 750 MG/150ML IV SOLN
INTRAVENOUS | Status: AC
  Administered 2015-04-21: 750 mg via INTRAVENOUS
  Filled 2015-04-21: qty 150

## 2015-04-21 MED ORDER — GABAPENTIN 100 MG PO CAPS
200.0000 mg | ORAL_CAPSULE | Freq: Three times a day (TID) | ORAL | Status: DC
  Administered 2015-04-21 – 2015-04-24 (×10): 200 mg via ORAL
  Filled 2015-04-21 (×10): qty 2

## 2015-04-21 MED ORDER — LEVOFLOXACIN IN D5W 500 MG/100ML IV SOLN
500.0000 mg | INTRAVENOUS | Status: DC
  Filled 2015-04-21: qty 100

## 2015-04-21 MED ORDER — SODIUM CHLORIDE 0.9 % IV SOLN: 250.0000 mL | INTRAVENOUS | Status: DC | PRN

## 2015-04-21 MED ORDER — ACETAMINOPHEN 650 MG RE SUPP: 650.0000 mg | Freq: Four times a day (QID) | RECTAL | Status: DC | PRN

## 2015-04-21 MED ORDER — SODIUM CHLORIDE 0.9 % IJ SOLN
3.0000 mL | Freq: Two times a day (BID) | INTRAMUSCULAR | Status: DC
  Administered 2015-04-22 – 2015-04-24 (×5): 3 mL via INTRAVENOUS

## 2015-04-21 MED ORDER — PROCHLORPERAZINE MALEATE 10 MG PO TABS
10.0000 mg | ORAL_TABLET | Freq: Four times a day (QID) | ORAL | Status: DC | PRN
  Administered 2015-04-23: 07:00:00 10 mg via ORAL
  Filled 2015-04-21: qty 1

## 2015-04-21 MED ORDER — LORATADINE 10 MG PO TABS
10.0000 mg | ORAL_TABLET | Freq: Two times a day (BID) | ORAL | Status: DC
  Administered 2015-04-21 – 2015-04-22 (×2): 10 mg via ORAL
  Filled 2015-04-21 (×2): qty 1

## 2015-04-21 MED ORDER — HYDROCODONE-ACETAMINOPHEN 5-325 MG PO TABS
1.0000 | ORAL_TABLET | Freq: Four times a day (QID) | ORAL | Status: DC | PRN
  Administered 2015-04-22 – 2015-04-23 (×3): 1 via ORAL
  Filled 2015-04-21 (×3): qty 1

## 2015-04-21 NOTE — ED Notes (Signed)
Pt reports that he is having Chest pain and SOB and constipation since Friday. He states that he has not been able to get a deep breath, cant eat because of SOB. He also states he is constipated

## 2015-04-21 NOTE — H&P (Signed)
[facility] NAME: Jerry Davenport    MR#:  [id number]  DATE OF BIRTH:  July 10, 1941  DATE OF ADMISSION:  04/21/2015  PRIMARY CARE PHYSICIAN: Jerry Doe, MD   REQUESTING/REFERRING PHYSICIAN:  Hinda Davenport  CHIEF COMPLAINT:   Chief Complaint  Patient presents with  . Shortness of Breath  . Chest Pain  . Constipation    HISTORY OF PRESENT ILLNESS: Jerry Davenport  is a 74 y.o. 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 Complains of thick productive cough, increased shortness of breath especially with exertion, and subjective fever and chills. 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 And also left base airspace disease was noted. Emergency room doctor spoke to Jerry Davenport who recommended patient be started on heparin. He will see the patient in the morning.  PAST MEDICAL HISTORY:   Past Medical History  Diagnosis Date  . Hyposmolality and/or hyponatremia   . Other and unspecified hyperlipidemia   . Headache(784.0)   . Backache, unspecified   . Allergic rhinitis, cause unspecified   . Tobacco use disorder   . Other constipation   . Myocardial infarction   . Hypertension   . Shortness of breath     with exertion, pt. remarks that he "gives out more from his legs than his breathing"   . Stroke     Jerry Davenport- pt. told he had a stroke, but he doesn't think he did   . Peripheral vascular disease, unspecified   . GERD (gastroesophageal reflux disease)     indigestion on  occasion   . Arthritis     back, legs, knees   . A-fib   . Kidney stones     right kidney  . Coronary artery disease   . Dysrhythmia     a fib  . 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 COPD (chronic obstructive pulmonary disease)     PAST SURGICAL HISTORY:  Past Surgical History  Procedure Laterality Date  . Hemorroidectomy    . Cardiac catheterization  2014    [facility]  . Knee surgery  1990's    [facility]- Left knee, cartilage removed   . Multiple tooth extractions  1980's  . Coronary artery bypass graft N/A 08/13/2013    Procedure: CORONARY ARTERY BYPASS GRAFTING (CABG);  Surgeon: Jerry Nakayama, MD;  Location: [facility];  Service: Open Heart Surgery;  Laterality: N/A;  CABG x three,  using left internal mammary artery and right leg greater saphenous vein harvested endoscopically    SOCIAL HISTORY:  History  Substance Use Topics  . Smoking status: Former Smoker -- 0.25 packs/day for 60 years    Types: Cigarettes    Quit date: 12/10/2013  . Smokeless tobacco: Never Used     Comment: down to 2-4 cigs a day now  . Alcohol Use: No     Comment: none for three months, reports he hasn't ever been a heavy drinker    FAMILY HISTORY:  Family History  Problem Relation  Age of Onset  . Hypertension Mother     DRUG ALLERGIES: No Known Allergies  REVIEW OF SYSTEMS:   CONSTITUTIONAL: Positive fever, fatigue or weakness.  EYES: No blurred or double vision.  EARS, NOSE, AND THROAT: No tinnitus or ear pain.  RESPIRATORY: Positive cough and shortness of breath, denies wheezing or hemoptysis.  CARDIOVASCULAR: No chest pain, orthopnea, edema.  GASTROINTESTINAL: No nausea, vomiting, diarrhea or abdominal pain.  GENITOURINARY: No dysuria, hematuria.  ENDOCRINE: No polyuria, nocturia,  HEMATOLOGY: No anemia, easy bruising or bleeding SKIN: No rash or lesion. MUSCULOSKELETAL: No joint pain or arthritis.   NEUROLOGIC: No tingling, numbness, weakness.  PSYCHIATRY: No anxiety or  depression.   MEDICATIONS AT HOME:  Prior to Admission medications   Medication Sig Start Date End Date Taking? Authorizing Provider  amiodarone (PACERONE) 200 MG tablet Take 1 tablet (200 mg total) by mouth 2 (two) times daily as needed. 12/10/14  Yes Minna Merritts, MD  aspirin 162 MG EC tablet Take 162 mg by mouth daily.   Yes Historical Provider, MD  gabapentin (NEURONTIN) 100 MG capsule Take 300 mg by mouth 3 (three) times daily.    Yes Historical Provider, MD  HYDROcodone-acetaminophen (NORCO/VICODIN) 5-325 MG per tablet TK 1 T PO Q 6 H PRN FOR MODERATE PAIN. 03/26/15  Yes Historical Provider, MD  loratadine (CLARITIN) 10 MG tablet Take 10 mg by mouth 2 (two) times daily.   Yes Historical Provider, MD  metoprolol tartrate (LOPRESSOR) 25 MG tablet Take 1 tablet (25 mg total) by mouth 2 (two) times daily. Patient taking differently: Take 12.5 mg by mouth 2 (two) times daily.  12/10/14  Yes Minna Merritts, MD  nitroGLYCERIN (NITROSTAT) 0.4 MG SL tablet Place 1 tablet (0.4 mg total) under the tongue every 5 (five) minutes as needed. 12/10/14  Yes Minna Merritts, MD  prochlorperazine (COMPAZINE) 10 MG tablet Take 1 tablet (10 mg total) by mouth every 6 (six) hours as needed for nausea or vomiting. 03/12/15  Yes Lloyd Huger, MD      PHYSICAL EXAMINATION:   VITAL SIGNS: Blood pressure 116/61, pulse 80, temperature 98.1 F (36.7 C), temperature source Oral, resp. rate 23, height '5\' 8"'$  (1.727 m), weight 71.215 kg (157 lb), SpO2 100 %.  GENERAL:  74 y.o.-year-old patient lying in the bed with no acute distress.  EYES: Pupils equal, round, reactive to light and accommodation. No scleral icterus. Extraocular muscles intact.  HEENT: Head atraumatic, normocephalic. Oropharynx and nasopharynx clear.  NECK:  Supple, no jugular venous distention. No thyroid enlargement, no tenderness.  LUNGS: He has bilateral rhonchi, no wheezing or crackles no sensory muscle usage CARDIOVASCULAR: S1, S2  normal. No murmurs, rubs, or gallops.  ABDOMEN: Soft, nontender, nondistended. Bowel sounds present. No organomegaly or mass.  EXTREMITIES: No pedal edema, cyanosis, or clubbing.  NEUROLOGIC: Cranial nerves II through XII are intact. Muscle strength 5/5 in all extremities. Sensation intact. Gait not checked.  PSYCHIATRIC: The patient is alert and oriented x 3.  SKIN: No obvious rash, lesion, or ulcer.   LABORATORY PANEL:   CBC  Recent Labs Lab 04/17/15 0908 04/21/15 1109  WBC 5.2 11.7*  HGB 10.6* 9.5*  HCT 31.4* 27.9*  PLT 146* 156  MCV 114.5* 115.2*  MCH 38.5* 39.3*  MCHC 33.6 34.1  RDW 16.4* 16.7*  LYMPHSABS 0.5*  --   MONOABS 0.4  --   EOSABS 0.1  --   BASOSABS 0.1  --    ------------------------------------------------------------------------------------------------------------------  Chemistries  Recent Labs Lab 04/17/15 0908 04/21/15 1109  NA 136 133*  K 4.8 3.8  CL 106 97*  CO2 24 25  GLUCOSE 107* 132*  BUN 15 19  CREATININE 0.98 0.91  CALCIUM 8.3* 8.5*  MG  --  2.0  AST 13* 16  ALT 9* 9*  ALKPHOS 84 80  BILITOT 0.7 1.2   ------------------------------------------------------------------------------------------------------------------ estimated creatinine clearance is 68.9 mL/min (by C-G formula based on Cr of 0.91). ------------------------------------------------------------------------------------------------------------------ No results for input(s): TSH, T4TOTAL, T3FREE, THYROIDAB in the last 72 hours.  Invalid input(s): FREET3   Coagulation profile  Recent Labs Lab 04/21/15 1110  INR 1.26   ------------------------------------------------------------------------------------------------------------------- No results for input(s): DDIMER in the last 72 hours. -------------------------------------------------------------------------------------------------------------------  Cardiac Enzymes  Recent Labs Lab 04/21/15 1109  TROPONINI  <0.03   ------------------------------------------------------------------------------------------------------------------ Invalid input(s): POCBNP  ---------------------------------------------------------------------------------------------------------------  Urinalysis    Component Value Date/Time   COLORURINE YELLOW 08/09/2013 [place] 08/09/2013 1558   LABSPEC 1.007 08/09/2013 1558   PHURINE 7.0 08/09/2013 1558   GLUCOSEU NEGATIVE 08/09/2013 1558   HGBUR TRACE* 08/09/2013 1558   BILIRUBINUR NEGATIVE 08/09/2013 1558   KETONESUR NEGATIVE 08/09/2013 1558   PROTEINUR NEGATIVE 08/09/2013 1558   UROBILINOGEN 1.0 08/09/2013 1558   NITRITE NEGATIVE 08/09/2013 1558   LEUKOCYTESUR NEGATIVE 08/09/2013 1558     RADIOLOGY: Dg Chest 2 View  04/21/2015   CLINICAL DATA:  Acute onset of chest pain. Current history of left upper lobe and left lower lobe lung cancer.  EXAM: CHEST  2 VIEW  COMPARISON:  01/28/2015 and earlier, including PET-CT 01/22/2015 and CT chest 01/15/2015.  FINDINGS: Prior sternotomy for CABG. Cardiac silhouette normal in size, unchanged. Central left upper lobe lung mass in the perihilar region, increased in size since the most recent prior examination. Airspace opacities in the left lower lobe, adenocarcinoma, also increased since the prior examination. Severe emphysematous changes in the upper lobes, unchanged. No new parenchymal abnormality elsewhere. Pulmonary vascularity normal without evidence of pulmonary edema. No pleural effusions.  IMPRESSION: 1. Enlarging central left upper lobe lung mass and a progressive airspace opacities throughout the left lower lobe, likely adenocarcinoma (I do not have available the results of the left lower lobe biopsy performed in April, 2016). Superimposed acute pneumonia could account for the increased airspace opacities. 2. No new abnormalities elsewhere in either lung. 3. Severe COPD/emphysema.   Electronically Signed   By:  Evangeline Dakin M.D.   On: 04/21/2015 11:35   Ct Angio Chest Pe W/cm &/or Wo Cm  04/21/2015   CLINICAL DATA:  Dyspnea.  Chest pain.  History of lung cancer.  EXAM: CT ANGIOGRAPHY CHEST WITH CONTRAST  TECHNIQUE: Multidetector CT imaging of the chest was performed using the standard protocol during bolus administration of intravenous contrast. Multiplanar CT image reconstructions and MIPs were obtained to evaluate the vascular anatomy.  CONTRAST:  18m OMNIPAQUE IOHEXOL 350 MG/ML SOLN  COMPARISON:  Chest radiograph of 04/21/2015.  CT of 01/15/2015.  FINDINGS: Mediastinum/Nodes: The quality of this exam for evaluation of pulmonary embolism is sufficient. Left lower lobe pulmonary arterial tree is surrounded by tumor proximally, and completely occluded. No right-sided pulmonary artery enlargement.  The RA to LA ratio is 3.6 cm/ 5.8 cm, equal to 0.62, normal.  No supraclavicular adenopathy. No axillary adenopathy. Advanced aortic and branch vessel atherosclerosis. Ulcerative plaque in the descending thoracic aorta. Mild cardiomegaly, without pericardial effusion. No right hilar adenopathy. Extension of left upper lobe mass versus left hilar and infrahilar adenopathy, including at  the origin of the left pulmonary artery measuring 2.4 x 2.6 cm on image 73.  Lungs/Pleura: Small left pleural effusion is new since the prior CT. Advanced centrilobular and paraseptal emphysema. Mild motion degradation in the lung bases. Lower lobe predominant bronchial wall thickening.  New pulmonary nodules, including a right upper lobe 6 mm nodule on image 93 of series 7.  Right lower lobe pulmonary nodule of 1.0 cm on image 106 series 7.  Further enlargement of central left upper lobe lung mass, with direct extension into the AP window region. 4.3 x 4.5 cm on image 64 of series 5 versus 3.2 x 2.2 cm on the prior exam.  Patchy airspace disease involves the left lower lobe. Left-sided airways are patent but narrowed.  Upper abdomen:  Scattered low-density liver lesions which are relatively well-circumscribed and felt to the similar to on the prior. Normal imaged portions of the spleen, stomach, gallbladder, right adrenal gland. Pancreatic atrophy. Left adrenal nodularity which is similar. Incompletely imaged left renal lesion is low-density and likely a cyst.  Musculoskeletal: mildly heterogeneous marrow density, favored to be related to osteopenia.  Review of the MIP images confirms the above findings.  IMPRESSION: 1. Marked progression of primary bronchogenic carcinoma. Enlargement of a central left upper lobe lung mass with continuous tumor and/or adenopathy extending in the left hilum and along the left lower lobe pulmonary arterial tree. 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 2. Left base airspace disease, suspicious for concurrent infection. 3. New Right-sided pulmonary nodules, most consistent with metastatic disease. 4. Small left pleural effusion. These results were called by telephone at the time of interpretation on 04/21/2015 at 2:22 pm to Marga Melnick, r.n., who verbally acknowledged these results.   Electronically Signed   By: Abigail Miyamoto M.D.   On: 04/21/2015 14:25   Dg Abd 2 Views  04/21/2015   CLINICAL DATA:  Generalized abdominal pain, constipation for 3-4 days.  EXAM: ABDOMEN - 2 VIEW  COMPARISON:  CT 01/28/2015  FINDINGS: Nonobstructive bowel gas pattern. Normal stool burden. No organomegaly or free air. No suspicious calcification. Vascular calcifications throughout the aorta and iliac vessels. No acute bony abnormality.  IMPRESSION: No evidence of bowel obstruction or free air.  Normal stool burden.   Electronically Signed   By: Rolm Baptise M.D.   On: 04/21/2015 12:26    EKG: Orders placed or performed during the Davenport encounter of 04/21/15  . ED EKG (<61mns upon arrival to the ED)  . ED EKG (<125ms upon arrival to the ED)    IMPRESSION AND PLAN: Patient is a  pleasant [age]ear old white male with lung cancer presents with shortness of breath and chest pain  1. Acute respiratory failure; due to progression of his lung cancer, as well as chronic clot. Patient will be treated with IV heparin, also due to infectious process noted on the CT scan will treat him with IV Levaquin. He will be seen by oncology to decide which way to proceed further in terms of his progressive lung cancer.  2. History of atrial fibrillation; continue amiodarone and metoprolol  3. Hypertension we'll continue metoprolol as taking at home  4. Miscellaneous: Patient will be on heparin   All the records are reviewed and case discussed with ED provider. Management plans discussed with the patient, family and they are in agreement.  CODE STATUS: I discussed the CODE STATUS with the patient with the sister and brother present he does not want to  be resuscitated    Code Status Orders        Start     Ordered   04/21/15 1657  Do not attempt resuscitation (DNR)   Continuous    Question Answer Comment  In the event of cardiac or respiratory ARREST Do not call a "code blue"   In the event of cardiac or respiratory ARREST Do not perform Intubation, CPR, defibrillation or ACLS   In the event of cardiac or respiratory ARREST Use medication by any route, position, wound care, and other measures to relive pain and suffering. May use oxygen, suction and manual treatment of airway obstruction as needed for comfort.      04/21/15 1656       TOTAL TIME TAKING CARE OF THIS PATIENT: 55 minutes.    Dustin Flock M.D on 04/21/2015 at 5:09 PM  Between 7am to 6pm - Pager - [phone]  After 6pm go to [email or web address] - password [email or web address] [facility]  Office  [phone]  CC: Primary care physician; Jerry Doe, MD

## 2015-04-21 NOTE — Progress Notes (Signed)
ANTICOAGULATION CONSULT NOTE - Initial Consult  Pharmacy Consult for Heparin  Indication: Pulmonary occulusion distal thrombus/treat like PE   No Known Allergies  Patient Measurements: Height: '5\' 8"'$  (172.7 cm) Weight: 157 lb (71.215 kg) IBW/kg (Calculated) : 68.4 Heparin Dosing Weight:   Vital Signs: Temp: 98.1 F (36.7 C) (07/04 1055) Temp Source: Oral (07/04 1055) BP: 119/66 mmHg (07/04 1430) Pulse Rate: 87 (07/04 1430)  Labs:  Recent Labs  04/21/15 1109  HGB 9.5*  HCT 27.9*  PLT 156  CREATININE 0.91  TROPONINI <0.03    Estimated Creatinine Clearance: 68.9 mL/min (by C-G formula based on Cr of 0.91).   Medical History: Past Medical History  Diagnosis Date  . Hyposmolality and/or hyponatremia   . Other and unspecified hyperlipidemia   . Headache(784.0)   . Backache, unspecified   . Allergic rhinitis, cause unspecified   . Tobacco use disorder   . Other constipation   . Myocardial infarction   . Hypertension   . Shortness of breath     with exertion, pt. remarks that he "gives out more from his legs than his breathing"   . Stroke     [facility]- pt. told he had a stroke, but he doesn't think he did   . Peripheral vascular disease, unspecified   . GERD (gastroesophageal reflux disease)     indigestion on occasion   . Arthritis     back, legs, knees   . A-fib   . Kidney stones     right kidney  . Coronary artery disease   . Dysrhythmia     a fib  . 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 COPD (chronic obstructive pulmonary disease)     Medications:  Scheduled:    Assessment: Patient admitted for chest pain. Treating patient for distal thrombus.  Goal of Therapy:  Heparin level 0.3-0.7 units/ml Monitor platelets by anticoagulation protocol: Yes   Plan:  Start heparin infusion at 1200 units/hr. MD does not want heparin bolus.   Kayani Rapaport D 04/21/2015,3:32 PM

## 2015-04-21 NOTE — Plan of Care (Signed)
Problem: Discharge Progression Outcomes Goal: Discharge plan in place and appropriate Individualization of Care Pt prefers to be called Jerry Davenport Hx of squamous cell lung ca, hyposmolality, hyponatremia, headaches, backache, allergic rhinitis, constipation, MI, HTN, SOB, Stroke, PVD, GERD, A fib, Arthritis, kidney stones, CAD, failed CABG, COPD on home meds HIGH Fall Precautions. Goal: Other Discharge Outcomes/Goals Pt admitted for PE and lung cancer, on heparin drip, morphine given for pain x 1, suicide precautions initiated per orders and pts ideation of suicide, o2 at 2 lpm for comfort, pt with productive cough, s/p chemo and radiation last Friday.

## 2015-04-21 NOTE — ED Notes (Signed)
Patient states he is having sharp pains on the left side of his chest and it increases when he lays down.  Patient's last radiation treatment was Friday 04/18/15 and he began having pain on 04/19/15

## 2015-04-21 NOTE — Progress Notes (Signed)
Notified Dr. Lavetta Nielsen that pt has an order for cardiac monitoring placed by ED physician. Pt currently not on telemetry, do you want to apply? MD verbalized to d/c cardiac monitoring order.

## 2015-04-21 NOTE — ED Provider Notes (Signed)
[facility] Emergency Department Provider Note  ____________________________________________  Time seen: Approximately 12:31 PM  I have reviewed the triage vital signs and the nursing notes.   HISTORY  Chief Complaint Shortness of Breath; Chest Pain; and Constipation    HPI Jerry Davenport is a 74 y.o. male with a history of lung cancer who is actively undergoing radiation and chemotherapy treatment presents with persistent left-sided chest pain for 2-3 days and worsening shortness of breath that has become severe over the same time.  He endorses a thick productive cough, increased shortness of breath especially with exertion, and subjective fever and chills.  He denies abdominal pain, nausea, and vomiting.  He endorses anorexia and states that he is not eating much lately.  He also feels constipated.  He describes his dyspnea is severe in his chest pain is moderate.   Past Medical History  Diagnosis Date  . Hyposmolality and/or hyponatremia   . Other and unspecified hyperlipidemia   . Headache(784.0)   . Backache, unspecified   . Allergic rhinitis, cause unspecified   . Tobacco use disorder   . Other constipation   . Myocardial infarction   . Hypertension   . Shortness of breath     with exertion, pt. remarks that he "gives out more from his legs than his breathing"   . Stroke     [facility]- pt. told he had a stroke, but he doesn't think he did   . Peripheral vascular disease, unspecified   . GERD (gastroesophageal reflux disease)     indigestion on occasion   . Arthritis     back, legs, knees   . A-fib   . Kidney stones     right kidney  . Coronary artery disease   . Dysrhythmia     a fib  . 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 COPD (chronic obstructive pulmonary disease)     Patient Active Problem List   Diagnosis Date Noted  . Lung cancer 03/24/2015  . Lung mass 02/21/2015  . Atrial flutter 12/10/2014  . Atrial fibrillation  08/30/2013  . Orthostatic hypotension 08/30/2013  . 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 Hematuria 08/07/2013  . CAD (coronary artery disease) 07/26/2013  . Chest pain 07/16/2013  . SOB (shortness of breath) 07/16/2013  . Smoker 07/16/2013  . PAD (peripheral artery disease) 07/16/2013  . Hyperlipidemia 07/16/2013  . Blood in the urine 08/17/2012  . Back ache 08/17/2012  . CAFL (chronic airflow limitation) 08/17/2012  . 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 Cerebral infarct 08/17/2012  . Hay fever 08/17/2012  . BP (high blood pressure) 08/17/2012  . Hypercholesteremia 08/17/2012  . Diagnostic skin and sensitization tests 08/17/2012  . Current tobacco use 08/17/2012    Past Surgical History  Procedure Laterality Date  . Hemorroidectomy    . Cardiac catheterization  2014    [facility]  . Knee surgery  1990's    [facility]- Left knee, cartilage removed   . Multiple tooth extractions  1980's  . Coronary artery bypass graft N/A 08/13/2013    Procedure: CORONARY ARTERY BYPASS GRAFTING (CABG);  Surgeon: Melrose Nakayama, MD;  Location: [place];  Service: Open Heart Surgery;  Laterality: N/A;  CABG x three,  using left internal mammary artery and right leg greater saphenous vein harvested endoscopically    Current Outpatient Rx  Name  Route  Sig  Dispense  Refill  . amiodarone (PACERONE) 200 MG tablet   Oral  Take 1 tablet (200 mg total) by mouth 2 (two) times daily as needed.   60 tablet   3   . aspirin 162 MG EC tablet   Oral   Take 162 mg by mouth daily.         Marland Kitchen gabapentin (NEURONTIN) 100 MG capsule   Oral   Take 200 mg by mouth 3 (three) times daily.         Marland Kitchen HYDROcodone-acetaminophen (NORCO/VICODIN) 5-325 MG per tablet      TK 1 T PO Q 6 H PRN FOR MODERATE PAIN.      0   . loratadine (CLARITIN) 10 MG tablet   Oral   Take 10 mg by mouth 2 (two) times daily.         . metoprolol tartrate (LOPRESSOR) 25 MG tablet   Oral   Take 1 tablet (25 mg total) by  mouth 2 (two) times daily. Patient taking differently: Take 12.5 mg by mouth 2 (two) times daily.    60 tablet   12   . nitroGLYCERIN (NITROSTAT) 0.4 MG SL tablet   Sublingual   Place 1 tablet (0.4 mg total) under the tongue every 5 (five) minutes as needed.   25 tablet   12   . prochlorperazine (COMPAZINE) 10 MG tablet   Oral   Take 1 tablet (10 mg total) by mouth every 6 (six) hours as needed for nausea or vomiting.   120 tablet   1   . sucralfate (CARAFATE) 1 G tablet   Oral   Take 1 tablet (1 g total) by mouth 4 (four) times daily -  with meals and at bedtime.   90 tablet   2   . triamterene-hydrochlorothiazide (DYAZIDE) 37.5-25 MG per capsule   Oral   Take by mouth.           Allergies Review of patient's allergies indicates no known allergies.  Family History  Problem Relation Age of Onset  . Hypertension Mother     Social History History  Substance Use Topics  . Smoking status: Former Smoker -- 0.25 packs/day for 60 years    Types: Cigarettes    Quit date: 12/10/2013  . Smokeless tobacco: Never Used     Comment: down to 2-4 cigs a day now  . Alcohol Use: No     Comment: none for three months, reports he hasn't ever been a heavy drinker    Review of Systems Constitutional: Subjective fever and chills Eyes: No visual changes. ENT: No sore throat. Cardiovascular: Left central chest pain. Respiratory: Worsening shortness of breath Gastrointestinal: No abdominal pain.  No nausea, no vomiting.  No diarrhea.  Feels constipated. Genitourinary: Negative for dysuria. Musculoskeletal: Negative for back pain. Skin: Negative for rash. Neurological: Negative for headaches, focal weakness or numbness.  10-point ROS otherwise negative.  ____________________________________________   PHYSICAL EXAM:  VITAL SIGNS: ED Triage Vitals  Enc Vitals Group     BP 04/21/15 1055 109/49 mmHg     Pulse Rate 04/21/15 1055 84     Resp 04/21/15 1055 22     Temp  04/21/15 1055 98.1 F (36.7 C)     Temp Source 04/21/15 1055 Oral     SpO2 04/21/15 1055 99 %     Weight 04/21/15 1055 157 lb (71.215 kg)     Height 04/21/15 1055 '5\' 8"'$  (1.727 m)     Head Cir --      Peak Flow --  Pain Score 04/21/15 1056 7     Pain Loc --      Pain Edu? --      Excl. in [place]? --     Constitutional: Alert and oriented.  The appearance of chronic illness.  Slightly increased work of breathing. Eyes: Conjunctivae are normal. PERRL. EOMI. Head: Atraumatic. Nose: No congestion/rhinnorhea. Mouth/Throat: Mucous membranes are moist.  Oropharynx non-erythematous. Neck: No stridor.   Cardiovascular: Normal rate, regular rhythm. Grossly normal heart sounds.  Good peripheral circulation. Respiratory: Thick cough, frequent.  Slightly increased work of breathing with mild accessory muscle usage.  Decreased breath sounds on the left. Gastrointestinal: Soft and nontender. No distention. No abdominal bruits. No CVA tenderness. Musculoskeletal: No lower extremity tenderness nor edema.  No joint effusions. Neurologic:  Normal speech and language. No gross focal neurologic deficits are appreciated. Speech is normal. Skin:  Skin is warm, dry and intact. No rash noted. Psychiatric: Mood and affect are normal. Speech and behavior are normal.  ____________________________________________   LABS (all labs ordered are listed, but only abnormal results are displayed)  Labs Reviewed  CBC - Abnormal; Notable for the following:    WBC 11.7 (*)    RBC 2.42 (*)    Hemoglobin 9.5 (*)    HCT 27.9 (*)    MCV 115.2 (*)    MCH 39.3 (*)    RDW 16.7 (*)    All other components within normal limits  BASIC METABOLIC PANEL - Abnormal; Notable for the following:    Sodium 133 (*)    Chloride 97 (*)    Glucose, Bld 132 (*)    Calcium 8.5 (*)    All other components within normal limits  HEPATIC FUNCTION PANEL - Abnormal; Notable for the following:    Albumin 3.0 (*)    ALT 9 (*)    All  other components within normal limits  LIPASE, BLOOD - Abnormal; Notable for the following:    Lipase 20 (*)    All other components within normal limits  CULTURE, BLOOD (ROUTINE X 2)  CULTURE, BLOOD (ROUTINE X 2)  TROPONIN I  MAGNESIUM  APTT  PROTIME-INR  HEPARIN LEVEL (UNFRACTIONATED)   ____________________________________________  EKG  ED ECG REPORT I, Janeli Lewison, the attending physician, personally viewed and interpreted this ECG.   Date: 04/21/2015  EKG Time: 11:12  Rate: 88  Rhythm: normal sinus rhythm  Axis: normal  Intervals:normal  ST&T Change: Non-specific ST segment / T-wave changes, but no evidence of acute ischemia.  Inverted T waves in leads 3 and V6  ____________________________________________  RADIOLOGY  Dg Chest 2 View  04/21/2015   CLINICAL DATA:  Acute onset of chest pain. Current history of left upper lobe and left lower lobe lung cancer.  EXAM: CHEST  2 VIEW  COMPARISON:  01/28/2015 and earlier, including PET-CT 01/22/2015 and CT chest 01/15/2015.  FINDINGS: Prior sternotomy for CABG. Cardiac silhouette normal in size, unchanged. Central left upper lobe lung mass in the perihilar region, increased in size since the most recent prior examination. Airspace opacities in the left lower lobe, adenocarcinoma, also increased since the prior examination. Severe emphysematous changes in the upper lobes, unchanged. No new parenchymal abnormality elsewhere. Pulmonary vascularity normal without evidence of pulmonary edema. No pleural effusions.  IMPRESSION: 1. Enlarging central left upper lobe lung mass and a progressive airspace opacities throughout the left lower lobe, likely adenocarcinoma (I do not have available the results of the left lower lobe biopsy performed in April, 2016). Superimposed acute  pneumonia could account for the increased airspace opacities. 2. No new abnormalities elsewhere in either lung. 3. Severe COPD/emphysema.   Electronically Signed   By:  Evangeline Dakin M.D.   On: 04/21/2015 11:35   Dg Abd 2 Views  04/21/2015   CLINICAL DATA:  Generalized abdominal pain, constipation for 3-4 days.  EXAM: ABDOMEN - 2 VIEW  COMPARISON:  CT 01/28/2015  FINDINGS: Nonobstructive bowel gas pattern. Normal stool burden. No organomegaly or free air. No suspicious calcification. Vascular calcifications throughout the aorta and iliac vessels. No acute bony abnormality.  IMPRESSION: No evidence of bowel obstruction or free air.  Normal stool burden.   Electronically Signed   By: Rolm Baptise M.D.   On: 04/21/2015 12:26    ____________________________________________   PROCEDURES  Procedure(s) performed: None  Critical Care performed: No ____________________________________________   INITIAL IMPRESSION / ASSESSMENT AND PLAN / ED COURSE  Pertinent labs & imaging results that were available during my care of the patient were reviewed by me and considered in my medical decision making (see chart for details).  The patient is immunocompromised on chemotherapy and is taking radiation treatments as well.  Given his subjective fever and chills and increased shortness of breath, I began treating healthcare associated pneumonia empirically with Levaquin 750 mg IV, ceftazidime 2 g IV, and vancomycin 1 g IV, as well as a 500 mL normal saline IV bolus.  Dr. Grayland Ormond is his hematologist/oncologist.  I was concerned for the possibility of pulmonary embolism given the pleuritic chest pain in the increased shortness of breath and his cancer history, and he has no evidence of PE on CT chest.  However, his tumor is significantly worse and is occluding a pulmonary artery which is causing thrombus distal to the occlusion.  Additionally he has a left-sided pneumonia.  I discussed the case by phone with Dr. Grayland Ormond who agreed with empiric antibiotics and recommended a heparin drip without bolus until the patient is seen by him tomorrow to discuss additional treatment plans  and options.  He has no abdominal tenderness and his abdominal x-rays were reassuring.  I updated the patient and his family.  ____________________________________________  FINAL CLINICAL IMPRESSION(S) / ED DIAGNOSES  Final diagnoses:  Chest pain  Healthcare-associated pneumonia  Metastatic lung cancer (metastasis from lung to other site), unspecified laterality  Thrombus      NEW MEDICATIONS STARTED DURING THIS VISIT:  New Prescriptions   No medications on file     Hinda Kehr, MD 04/21/15 1553

## 2015-04-21 NOTE — Progress Notes (Signed)
Pt expressed that he had had suicidal ideations in the past and today.  Pt stated "I am going to end it with the pistol when I get home".  Sister informed RN that the patient had expressed killing himself and his son after discharge because the "son was on drugs and there would be no one to care for him".  MD, Charge Nurse and [facility] notified, orders for suicide precautions received, room prepped for suicide precautions, sitter at bedside.  Chaplain into see.

## 2015-04-21 NOTE — Progress Notes (Signed)
   04/21/15 1730  Clinical Encounter Type  Visited With Patient and family together  Visit Type Initial  Referral From Nurse  Consult/Referral To Chaplain  Spiritual Encounters  Spiritual Needs Emotional  Stress Factors  Patient Stress Factors Health changes  Visited with patient and family. Pt said he was feeling ok, but tired of being sick.  Going for radiation treatment tomorrow.  Appeared pleasant and calm but drained.  Said he had seen me before over at the cancer center.  Patient and family thanked me for coming to visit with them.  [place] [phone]

## 2015-04-21 NOTE — ED Notes (Signed)
Pharmacy called for fortaz gtt

## 2015-04-22 ENCOUNTER — Ambulatory Visit: Payer: Commercial Managed Care - HMO

## 2015-04-22 ENCOUNTER — Ambulatory Visit: Payer: Commercial Managed Care - HMO | Admitting: Radiation Oncology

## 2015-04-22 DIAGNOSIS — E039 Hypothyroidism, unspecified: Secondary | ICD-10-CM

## 2015-04-22 DIAGNOSIS — E785 Hyperlipidemia, unspecified: Secondary | ICD-10-CM

## 2015-04-22 DIAGNOSIS — Z79899 Other long term (current) drug therapy: Secondary | ICD-10-CM

## 2015-04-22 DIAGNOSIS — D649 Anemia, unspecified: Secondary | ICD-10-CM

## 2015-04-22 DIAGNOSIS — E709 Disorder of aromatic amino-acid metabolism, unspecified: Secondary | ICD-10-CM

## 2015-04-22 DIAGNOSIS — I10 Essential (primary) hypertension: Secondary | ICD-10-CM

## 2015-04-22 DIAGNOSIS — Z7982 Long term (current) use of aspirin: Secondary | ICD-10-CM

## 2015-04-22 DIAGNOSIS — E780 Pure hypercholesterolemia: Secondary | ICD-10-CM

## 2015-04-22 DIAGNOSIS — C9110 Chronic lymphocytic leukemia of B-cell type not having achieved remission: Secondary | ICD-10-CM

## 2015-04-22 DIAGNOSIS — R160 Hepatomegaly, not elsewhere classified: Secondary | ICD-10-CM

## 2015-04-22 LAB — CBC
HEMATOCRIT: 21.7 % — AB (ref 40.0–52.0)
Hemoglobin: 7.4 g/dL — ABNORMAL LOW (ref 13.0–18.0)
MCH: 39.3 pg — AB (ref 26.0–34.0)
MCHC: 34.3 g/dL (ref 32.0–36.0)
MCV: 114.6 fL — ABNORMAL HIGH (ref 80.0–100.0)
Platelets: 111 10*3/uL — ABNORMAL LOW (ref 150–440)
RBC: 1.89 MIL/uL — ABNORMAL LOW (ref 4.40–5.90)
RDW: 16.3 % — AB (ref 11.5–14.5)
WBC: 6.3 10*3/uL (ref 3.8–10.6)

## 2015-04-22 LAB — HEPARIN LEVEL (UNFRACTIONATED)
HEPARIN UNFRACTIONATED: 0.19 [IU]/mL — AB (ref 0.30–0.70)
Heparin Unfractionated: 0.1 IU/mL — ABNORMAL LOW (ref 0.30–0.70)
Heparin Unfractionated: 0.16 IU/mL — ABNORMAL LOW (ref 0.30–0.70)

## 2015-04-22 LAB — BASIC METABOLIC PANEL
Anion gap: 6 (ref 5–15)
BUN: 14 mg/dL (ref 6–20)
CALCIUM: 8 mg/dL — AB (ref 8.9–10.3)
CO2: 24 mmol/L (ref 22–32)
Chloride: 103 mmol/L (ref 101–111)
Creatinine, Ser: 0.77 mg/dL (ref 0.61–1.24)
GFR calc Af Amer: >60 mL/min (ref >60)
GLUCOSE: 101 mg/dL — AB (ref 65–99)
Potassium: 3.7 mmol/L (ref 3.5–5.1)
Sodium: 133 mmol/L — ABNORMAL LOW (ref 135–145)

## 2015-04-22 MED ORDER — VANCOMYCIN HCL IN DEXTROSE 1-5 GM/200ML-% IV SOLN
1000.0000 mg | Freq: Two times a day (BID) | INTRAVENOUS | Status: DC
  Administered 2015-04-22 – 2015-04-24 (×5): 1000 mg via INTRAVENOUS
  Filled 2015-04-22 (×7): qty 200

## 2015-04-22 MED ORDER — LORATADINE 10 MG PO TABS
10.0000 mg | ORAL_TABLET | Freq: Every day | ORAL | Status: DC
  Administered 2015-04-23 – 2015-04-24 (×2): 10 mg via ORAL
  Filled 2015-04-22 (×2): qty 1

## 2015-04-22 MED ORDER — LEVOFLOXACIN IN D5W 750 MG/150ML IV SOLN
750.0000 mg | INTRAVENOUS | Status: DC
  Administered 2015-04-22 – 2015-04-23 (×2): 750 mg via INTRAVENOUS
  Filled 2015-04-22 (×3): qty 150

## 2015-04-22 MED ORDER — HEPARIN (PORCINE) IN NACL 100-0.45 UNIT/ML-% IJ SOLN
1500.0000 [IU]/h | INTRAMUSCULAR | Status: DC
  Administered 2015-04-22: 1500 [IU]/h via INTRAVENOUS
  Filled 2015-04-22 (×3): qty 250

## 2015-04-22 MED ORDER — HEPARIN (PORCINE) IN NACL 100-0.45 UNIT/ML-% IJ SOLN
2050.0000 [IU]/h | INTRAMUSCULAR | Status: DC
  Administered 2015-04-22: 1700 [IU]/h via INTRAVENOUS
  Administered 2015-04-23: 1900 [IU]/h via INTRAVENOUS
  Filled 2015-04-22 (×5): qty 250

## 2015-04-22 NOTE — Plan of Care (Signed)
Problem: Discharge Progression Outcomes Goal: Other Discharge Outcomes/Goals Plan of care progress to goals: O2 sats in the high 90's on room air. Up to to bathroom with standby assist. Dyspnea on exertion, resolved once back in the bed. Pt declines O2 for comfort. HR/BP stable. Heparin drip increased to 72m/hr. Pt reported once mild chest pain-level 4 with coughing/deep breathing, but declined an intervention. Morphine given once for L lower ribs pain with improvement. No c/o n/v. Pt calm and cooperative. Suicide precautions maintained. Sitter and son at the bedside.

## 2015-04-22 NOTE — Progress Notes (Signed)
[facility] [place] NAME: Jerry Davenport    MR#:  [id number]  DATE OF BIRTH:  [birth year]  SUBJECTIVE:  CHIEF COMPLAINT:   Chief Complaint  Patient presents with  . Shortness of Breath  . Chest Pain  . Constipation   patient still complaining of some shortness of breath, still has some chest pain on left lateral chest area. No hemoptysis, positive cough but nonproductive.  REVIEW OF SYSTEMS:    Review of Systems  Constitutional: Negative for fever and chills.  HENT: Negative for congestion and tinnitus.   Eyes: Negative for blurred vision and double vision.  Respiratory: Positive for cough, shortness of breath and wheezing.   Cardiovascular: Negative for chest pain, orthopnea and PND.  Gastrointestinal: Negative for nausea, vomiting, abdominal pain and diarrhea.  Genitourinary: Negative for dysuria and hematuria.  Skin: Negative for rash.  Neurological: Positive for weakness (generalized). Negative for dizziness, sensory change and focal weakness.  Psychiatric/Behavioral: Positive for depression and suicidal ideas.  All other systems reviewed and are negative.   Nutrition: Regular Tolerating Diet: Yes Tolerating PT: Ambulatory   DRUG ALLERGIES:  No Known Allergies  VITALS:  Blood pressure 115/57, pulse 78, temperature 98.9 F (37.2 C), temperature source Oral, resp. rate 24, height '5\' 8"'$  (1.727 m), weight 71.215 kg (157 lb), SpO2 98 %.  PHYSICAL EXAMINATION:   Physical Exam  GENERAL:  74 y.o.-year-old patient lying in the bed in mild respiratory distress EYES: Pupils equal, round, reactive to light and accommodation. No scleral icterus. Extraocular muscles intact.  HEENT: Head atraumatic, normocephalic. Oropharynx and nasopharynx clear.  NECK:  Supple, no jugular venous distention. No thyroid enlargement, no tenderness.  LUNGS: Diffuse rhonchi, wheezing bilaterally. Negative use of accessory muscles. No dullness to  percussion.  CARDIOVASCULAR: S1, S2 normal. No murmurs, rubs, or gallops.  ABDOMEN: Soft, nontender, nondistended. Bowel sounds present. No organomegaly or mass.  EXTREMITIES: No cyanosis, clubbing or edema b/l.    NEUROLOGIC: Cranial nerves II through XII are intact. No focal Motor or sensory deficits b/l.   PSYCHIATRIC: The patient is alert and oriented x 3. Appears depressed SKIN: No obvious rash, lesion, or ulcer.    LABORATORY PANEL:   CBC  Recent Labs Lab 04/22/15 0438  WBC 6.3  HGB 7.4*  HCT 21.7*  PLT 111*   ------------------------------------------------------------------------------------------------------------------  Chemistries   Recent Labs Lab 04/21/15 1109 04/22/15 0438  NA 133* 133*  K 3.8 3.7  CL 97* 103  CO2 25 24  GLUCOSE 132* 101*  BUN 19 14  CREATININE 0.91 0.77  CALCIUM 8.5* 8.0*  MG 2.0  --   AST 16  --   ALT 9*  --   ALKPHOS 80  --   BILITOT 1.2  --    ------------------------------------------------------------------------------------------------------------------  Cardiac Enzymes  Recent Labs Lab 04/21/15 1109  TROPONINI <0.03   ------------------------------------------------------------------------------------------------------------------  RADIOLOGY:  Dg Chest 2 View  04/21/2015   CLINICAL DATA:  Acute onset of chest pain. Current history of left upper lobe and left lower lobe lung cancer.  EXAM: CHEST  2 VIEW  COMPARISON:  01/28/2015 and earlier, including PET-CT 01/22/2015 and CT chest 01/15/2015.  FINDINGS: Prior sternotomy for CABG. Cardiac silhouette normal in size, unchanged. Central left upper lobe lung mass in the perihilar region, increased in size since the most recent prior examination. Airspace opacities in the left lower lobe, adenocarcinoma, also increased since the prior examination. Severe emphysematous changes in the upper lobes, unchanged. No  new parenchymal abnormality elsewhere. Pulmonary vascularity normal  without evidence of pulmonary edema. No pleural effusions.  IMPRESSION: 1. Enlarging central left upper lobe lung mass and a progressive airspace opacities throughout the left lower lobe, likely adenocarcinoma (I do not have available the results of the left lower lobe biopsy performed in April, 2016). Superimposed acute pneumonia could account for the increased airspace opacities. 2. No new abnormalities elsewhere in either lung. 3. Severe COPD/emphysema.   Electronically Signed   By: Evangeline Dakin M.D.   On: 04/21/2015 11:35   Ct Angio Chest Pe W/cm &/or Wo Cm  04/21/2015   CLINICAL DATA:  Dyspnea.  Chest pain.  History of lung cancer.  EXAM: CT ANGIOGRAPHY CHEST WITH CONTRAST  TECHNIQUE: Multidetector CT imaging of the chest was performed using the standard protocol during bolus administration of intravenous contrast. Multiplanar CT image reconstructions and MIPs were obtained to evaluate the vascular anatomy.  CONTRAST:  32m OMNIPAQUE IOHEXOL 350 MG/ML SOLN  COMPARISON:  Chest radiograph of 04/21/2015.  CT of 01/15/2015.  FINDINGS: Mediastinum/Nodes: The quality of this exam for evaluation of pulmonary embolism is sufficient. Left lower lobe pulmonary arterial tree is surrounded by tumor proximally, and completely occluded. No right-sided pulmonary artery enlargement.  The RA to LA ratio is 3.6 cm/ 5.8 cm, equal to 0.62, normal.  No supraclavicular adenopathy. No axillary adenopathy. Advanced aortic and branch vessel atherosclerosis. Ulcerative plaque in the descending thoracic aorta. Mild cardiomegaly, without pericardial effusion. No right hilar adenopathy. Extension of left upper lobe mass versus left hilar and infrahilar adenopathy, including at the origin of the left pulmonary artery measuring 2.4 x 2.6 cm on image 73.  Lungs/Pleura: Small left pleural effusion is new since the prior CT. Advanced centrilobular and paraseptal emphysema. Mild motion degradation in the lung bases. Lower lobe predominant  bronchial wall thickening.  New pulmonary nodules, including a right upper lobe 6 mm nodule on image 93 of series 7.  Right lower lobe pulmonary nodule of 1.0 cm on image 106 series 7.  Further enlargement of central left upper lobe lung mass, with direct extension into the AP window region. 4.3 x 4.5 cm on image 64 of series 5 versus 3.2 x 2.2 cm on the prior exam.  Patchy airspace disease involves the left lower lobe. Left-sided airways are patent but narrowed.  Upper abdomen: Scattered low-density liver lesions which are relatively well-circumscribed and felt to the similar to on the prior. Normal imaged portions of the spleen, stomach, gallbladder, right adrenal gland. Pancreatic atrophy. Left adrenal nodularity which is similar. Incompletely imaged left renal lesion is low-density and likely a cyst.  Musculoskeletal: mildly heterogeneous marrow density, favored to be related to osteopenia.  Review of the MIP images confirms the above findings.  IMPRESSION: 1. Marked progression of primary bronchogenic carcinoma. Enlargement of a central left upper lobe lung mass with continuous tumor and/or adenopathy extending in the left hilum and along the left lower lobe pulmonary arterial tree. 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 2. Left base airspace disease, suspicious for concurrent infection. 3. New Right-sided pulmonary nodules, most consistent with metastatic disease. 4. Small left pleural effusion. These results were called by telephone at the time of interpretation on 04/21/2015 at 2:22 pm to KMarga Melnick r.n., who verbally acknowledged these results.   Electronically Signed   By: KAbigail MiyamotoM.D.   On: 04/21/2015 14:25   Dg Abd 2 Views  04/21/2015   CLINICAL DATA:  Generalized  abdominal pain, constipation for 3-4 days.  EXAM: ABDOMEN - 2 VIEW  COMPARISON:  CT 01/28/2015  FINDINGS: Nonobstructive bowel gas pattern. Normal stool burden. No organomegaly or free air. No  suspicious calcification. Vascular calcifications throughout the aorta and iliac vessels. No acute bony abnormality.  IMPRESSION: No evidence of bowel obstruction or free air.  Normal stool burden.   Electronically Signed   By: Rolm Baptise M.D.   On: 04/21/2015 12:26     ASSESSMENT AND PLAN:   74 year old male with past medical history of lung cancer currently getting chemotherapy/radiation, chronic anemia, history of COPD, coronary disease, history of previous CVA, hypertension, who presented to the hospital due to chest pain shortness of breath and noted to have abnormal CT scan findings this interval. 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  #1 acute on chronic respiratory failure-this is likely multifactorial. Likely related to underlying lung cancer, pneumonia, chronic pulmonary thrombus. -Continue IV heparin for treatment for the thrombus. -Continue IV vancomycin, Levaquin for treatment for possible postobstructive pneumonia. -Patient likely needs to be assessed for home oxygen prior to discharge.  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 -We'll continue treatment with IV heparin. Await oncology input.  #3 pneumonia-this is likely postobstructive related to his underlying cancer -Continue IV Levaquin and IV vancomycin. Follow sputum, blood cultures.  #4 chronic anemia-likely related to patient's cancer and ongoing chemotherapy/radiation. -Follow serial hemoglobin. Continue heparin drip for now. Transfuse if hemoglobin falls less than 7.  #5 history of chronic atrial fibrillation-patient is rate controlled. -Continue metoprolol, amiodarone.  #6 lung cancer-likely stage IV disease. Patient CT scan shows advancement of his cancer and also new lesions on the right side of his lung. -Prognosis is likely poor but will await further oncology input. Consider palliative care involvement.  #7  hypertension-hemodynamically stable. -Continue triamterene/HCTZ, metoprolol  #8 neuropathy-continue Neurontin.   All the records are reviewed and case discussed with Care Management/Social Workerr. Management plans discussed with the patient, family and they are in agreement.  CODE STATUS: DO NOT RESUSCITATE  DVT Prophylaxis: Heparin drip  TOTAL TIME TAKING CARE OF THIS PATIENT: 30 minutes.   POSSIBLE D/C IN 2-3 DAYS, DEPENDING ON CLINICAL CONDITION.   Henreitta Leber M.D on 04/22/2015 at 10:31 AM  Between 7am to 6pm - Pager - [phone]  After 6pm go to [email or web address] - password [email or web address] [facility]  Office  [phone]  CC: Primary care physician; Dicky Doe, MD

## 2015-04-22 NOTE — Progress Notes (Signed)
ANTICOAGULATION CONSULT NOTE - Follow Up Consult  Pharmacy Consult for Heparin Indication: Pulmonary occlusion distal thrombus/treat like PE  No Known Allergies  Patient Measurements: Height: '5\' 8"'$  (172.7 cm) Weight: 157 lb (71.215 kg) IBW/kg (Calculated) : 68.4 Heparin Dosing Weight: 71.2 kg  Vital Signs: Temp: 98.3 F (36.8 C) (07/05 2115) Temp Source: Oral (07/05 2115) BP: 98/42 mmHg (07/05 2115) Pulse Rate: 66 (07/05 2115)  Labs:  Recent Labs  04/21/15 1109 04/21/15 1110 04/22/15 0118 04/22/15 0438 04/22/15 1034 04/22/15 2108  HGB 9.5*  --   --  7.4*  --   --   HCT 27.9*  --   --  21.7*  --   --   PLT 156  --   --  111*  --   --   APTT  --  45*  --   --   --   --   LABPROT  --  16.0*  --   --   --   --   INR  --  1.26  --   --   --   --   HEPARINUNFRC  --   --  0.10*  --  0.16* 0.19*  CREATININE 0.91  --   --  0.77  --   --   TROPONINI <0.03  --   --   --   --   --     Estimated Creatinine Clearance: 78.4 mL/min (by C-G formula based on Cr of 0.77).   Medications:  Scheduled:  . amiodarone  200 mg Oral BID  . aspirin  162 mg Oral Daily  . gabapentin  200 mg Oral TID  . levofloxacin (LEVAQUIN) IV  750 mg Intravenous Q24H  . [START ON 04/23/2015] loratadine  10 mg Oral Daily  . metoprolol tartrate  25 mg Oral BID  . sodium chloride  3 mL Intravenous Q12H  . sucralfate  1 g Oral TID WC & HS  . triamterene-hydrochlorothiazide  1 capsule Oral Daily  . vancomycin  1,000 mg Intravenous Q12H   Infusions:  . heparin 1,700 Units/hr (04/22/15 1214)   PRN: sodium chloride, acetaminophen **OR** acetaminophen, HYDROcodone-acetaminophen, ipratropium-albuterol, morphine injection, nitroGLYCERIN, prochlorperazine, sodium chloride  Assessment: Patient on heparin drip for pulmonary occlusion due to tumor from lung CA. Heparin level is below goal. No bolus per MD.   Goal of Therapy:  Heparin level 0.3-0.7 units/ml Monitor platelets by anticoagulation protocol: Yes    Plan:  Will increase heparin drip from 1700 to 1900 units/hr and recheck a HL in 8 h. CBC ordered for AM.   Krissa Utke C 04/22/2015,10:05 PM

## 2015-04-22 NOTE — Progress Notes (Signed)
ANTIBIOTIC CONSULT NOTE - INITIAL  Pharmacy Consult for vancomycin Indication: pneumonia Hx lung cancer  No Known Allergies  Patient Measurements: Height: '5\' 8"'$  (172.7 cm) Weight: 157 lb (71.215 kg) IBW/kg (Calculated) : 68.4   Vital Signs: Temp: 98 F (36.7 C) (07/05 1416) Temp Source: Oral (07/05 1416) BP: 123/61 mmHg (07/05 1416) Pulse Rate: 72 (07/05 1416) Intake/Output from previous day: 07/04 0701 - 07/05 0700 In: 993 [I.V.:993] Out: 300 [Urine:300] Intake/Output from this shift: Total I/O In: 120 [P.O.:120] Out: -   Labs:  Recent Labs  04/21/15 1109 04/22/15 0438  WBC 11.7* 6.3  HGB 9.5* 7.4*  PLT 156 111*  CREATININE 0.91 0.77   Estimated Creatinine Clearance: 78.4 mL/min (by C-G formula based on Cr of 0.77). No results for input(s): VANCOTROUGH, VANCOPEAK, VANCORANDOM, GENTTROUGH, GENTPEAK, GENTRANDOM, TOBRATROUGH, TOBRAPEAK, TOBRARND, AMIKACINPEAK, AMIKACINTROU, AMIKACIN in the last 72 hours.   Microbiology: Recent Results (from the past 720 hour(s))  Blood culture (routine x 2)     Status: None (Preliminary result)   Collection Time: 04/21/15  1:07 PM  Result Value Ref Range Status   Specimen Description BLOOD LEFT ASSIST CONTROL  Final   Special Requests BOTTLES DRAWN AEROBIC AND ANAEROBIC 5 CC  Final   Culture NO GROWTH < 24 HOURS  Final   Report Status PENDING  Incomplete  Blood culture (routine x 2)     Status: None (Preliminary result)   Collection Time: 04/21/15  1:07 PM  Result Value Ref Range Status   Specimen Description BLOOD RIGHT ARM  Final   Special Requests BOTTLES DRAWN AEROBIC AND ANAEROBIC 5 CC  Final   Culture NO GROWTH < 24 HOURS  Final   Report Status PENDING  Incomplete    Medical History: Past Medical History  Diagnosis Date  . Hyposmolality and/or hyponatremia   . Other and unspecified hyperlipidemia   . Headache(784.0)   . Backache, unspecified   . Allergic rhinitis, cause unspecified   . Tobacco use disorder   .  Other constipation   . Myocardial infarction   . Hypertension   . Shortness of breath     with exertion, pt. remarks that he "gives out more from his legs than his breathing"   . Stroke     [facility]- pt. told he had a stroke, but he doesn't think he did   . Peripheral vascular disease, unspecified   . GERD (gastroesophageal reflux disease)     indigestion on occasion   . Arthritis     back, legs, knees   . A-fib   . Kidney stones     right kidney  . Coronary artery disease   . Dysrhythmia     a fib  . 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 COPD (chronic obstructive pulmonary disease)     Assessment: 74 yo male here with PNA with hx of COPD and lung cancer.   Ke 0.069, half life 10.1 h, Vd 49.8 L  Goal of Therapy:  Vancomycin trough level 15-20 mcg/ml  Plan:  Will order vancomycin 1 g IV q12h.  Will order trough before 4th dose - 7/6 at 2330   Rayna Sexton L 04/22/2015,3:59 PM

## 2015-04-22 NOTE — Progress Notes (Signed)
ANTICOAGULATION CONSULT NOTE - Follow Up Consult  Pharmacy Consult for Heparin Indication: Pulmonary occlusion distal thrombus/treat like PE  No Known Allergies  Patient Measurements: Height: '5\' 8"'$  (172.7 cm) Weight: 157 lb (71.215 kg) IBW/kg (Calculated) : 68.4 Heparin Dosing Weight: 70 kg  Vital Signs: Temp: 99.8 F (37.7 C) (07/04 2112) Temp Source: Oral (07/04 2112) BP: 109/53 mmHg (07/04 2112) Pulse Rate: 80 (07/04 1600)  Labs:  Recent Labs  04/21/15 1109 04/21/15 1110 04/22/15 0118  HGB 9.5*  --   --   HCT 27.9*  --   --   PLT 156  --   --   APTT  --  45*  --   LABPROT  --  16.0*  --   INR  --  1.26  --   HEPARINUNFRC  --   --  0.10*  CREATININE 0.91  --   --   TROPONINI <0.03  --   --     Estimated Creatinine Clearance: 68.9 mL/min (by C-G formula based on Cr of 0.91).   Medications:  Scheduled:  . amiodarone  200 mg Oral BID  . aspirin  162 mg Oral Daily  . gabapentin  200 mg Oral TID  . levofloxacin (LEVAQUIN) IV  500 mg Intravenous Q24H  . loratadine  10 mg Oral BID  . metoprolol tartrate  25 mg Oral BID  . sodium chloride  3 mL Intravenous Q12H  . sucralfate  1 g Oral TID WC & HS  . triamterene-hydrochlorothiazide  1 capsule Oral Daily   Infusions:  . heparin     PRN: sodium chloride, acetaminophen **OR** acetaminophen, HYDROcodone-acetaminophen, ipratropium-albuterol, morphine injection, nitroGLYCERIN, prochlorperazine, sodium chloride  Assessment: Patient on heparin drip for pulmonary occlusion due to tumor from lung CA. Heparin level is below goal. No bolus per MD.   Goal of Therapy:  Heparin level 0.3-0.7 units/ml Monitor platelets by anticoagulation protocol: Yes   Plan:  Will increase heparin drip to 1500 units/hr and recheck a HL in 8 h.   Ulice Dash D 04/22/2015,2:10 AM

## 2015-04-22 NOTE — Consult Note (Signed)
Jerry Davenport  [phone] [phone]  ID: Jerry Davenport OB: Jun 27, 1941  MR#: [id number]  AOZ#:[id number]  Patient Care Team: Arlis Porta., MD as PCP - General (Family Medicine) Minna Merritts, MD as Consulting Physician (Cardiology) Katha Cabal, MD (Vascular Surgery)  CHIEF COMPLAINT:  Chief Complaint  Patient presents with  . Shortness of Breath  . Chest Pain  . Constipation    INTERVAL HISTORY: Patient is a 74 year old male who is currently undergoing treatment for squamous cell carcinoma the lung with carboplatinum and Taxol along with concurrent XRT. He is noted to the hospital with increasing shortness of breath and found to have a postobstructive pneumonia, possible progression of disease, and pulmonary artery thrombosis. Currently, he continues to feel short of breath is improved since admission. He denies any chest pain. He has a fair appetite and denies weight loss. He has no neurologic complaints. He denies any fevers. Patient otherwise feels well and offers no further specific complaints.  REVIEW OF SYSTEMS:   Review of Systems  Constitutional: Positive for malaise/fatigue. Negative for fever.  Respiratory: Positive for cough and shortness of breath.   Cardiovascular: Negative.   Gastrointestinal: Negative.   Psychiatric/Behavioral: Positive for suicidal ideas.    As per HPI. Otherwise, a complete review of systems is negatve.  PAST MEDICAL HISTORY: Past Medical History  Diagnosis Date  . Hyposmolality and/or hyponatremia   . Other and unspecified hyperlipidemia   . Headache(784.0)   . Backache, unspecified   . Allergic rhinitis, cause unspecified   . Tobacco use disorder   . Other constipation   . Myocardial infarction   . Hypertension   . Shortness of breath     with exertion, pt. remarks that he "gives out more from his legs than his breathing"   . Stroke     [facility]- pt. told he had a stroke, but he doesn't  think he did   . Peripheral vascular disease, unspecified   . GERD (gastroesophageal reflux disease)     indigestion on occasion   . Arthritis     back, legs, knees   . A-fib   . Kidney stones     right kidney  . Coronary artery disease   . Dysrhythmia     a fib  . 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 COPD (chronic obstructive pulmonary disease)     PAST SURGICAL HISTORY: Past Surgical History  Procedure Laterality Date  . Hemorroidectomy    . Cardiac catheterization  2014    [facility]  . Knee surgery  1990's    [facility]- Left knee, cartilage removed   . Multiple tooth extractions  1980's  . Coronary artery bypass graft N/A 08/13/2013    Procedure: CORONARY ARTERY BYPASS GRAFTING (CABG);  Surgeon: Melrose Nakayama, MD;  Location: [place];  Service: Open Heart Surgery;  Laterality: N/A;  CABG x three,  using left internal mammary artery and right leg greater saphenous vein harvested endoscopically    FAMILY HISTORY Family History  Problem Relation Age of Onset  . Hypertension Mother        ADVANCED DIRECTIVES:    HEALTH MAINTENANCE: History  Substance Use Topics  . Smoking status: Former Smoker -- 0.25 packs/day for 60 years    Types: Cigarettes    Quit date: 12/10/2013  . Smokeless tobacco: Never Used     Comment: down to 2-4 cigs a day now  . Alcohol Use: No  Comment: none for three months, reports he hasn't ever been a heavy drinker     Colonoscopy:  PAP:  Bone density:  Lipid panel:  No Known Allergies  Current Facility-Administered Medications  Medication Dose Route Frequency Provider Last Rate Last Dose  . 0.9 %  sodium chloride infusion  250 mL Intravenous PRN Dustin Flock, MD      . acetaminophen (TYLENOL) tablet 650 mg  650 mg Oral Q6H PRN Dustin Flock, MD       Or  . acetaminophen (TYLENOL) suppository 650 mg  650 mg Rectal Q6H PRN Dustin Flock, MD      . amiodarone (PACERONE) tablet 200 mg  200 mg Oral BID Dustin Flock, MD    200 mg at 04/22/15 1206  . aspirin EC tablet 162 mg  162 mg Oral Daily Dustin Flock, MD   162 mg at 04/22/15 1204  . gabapentin (NEURONTIN) capsule 200 mg  200 mg Oral TID Dustin Flock, MD   200 mg at 04/22/15 1206  . heparin ADULT infusion 100 units/mL (25000 units/250 mL)  1,700 Units/hr Intravenous Continuous Henreitta Leber, MD 17 mL/hr at 04/22/15 1214 1,700 Units/hr at 04/22/15 1214  . HYDROcodone-acetaminophen (NORCO/VICODIN) 5-325 MG per tablet 1 tablet  1 tablet Oral Q6H PRN Dustin Flock, MD   1 tablet at 04/22/15 0733  . ipratropium-albuterol (DUONEB) 0.5-2.5 (3) MG/3ML nebulizer solution 3 mL  3 mL Nebulization Q4H PRN Dustin Flock, MD      . levofloxacin (LEVAQUIN) IVPB 750 mg  750 mg Intravenous Q24H Henreitta Leber, MD      . Derrill Memo ON 04/23/2015] loratadine (CLARITIN) tablet 10 mg  10 mg Oral Daily Henreitta Leber, MD      . metoprolol tartrate (LOPRESSOR) tablet 25 mg  25 mg Oral BID Dustin Flock, MD   25 mg at 04/22/15 1206  . morphine 2 MG/ML injection 2 mg  2 mg Intravenous Q3H PRN Dustin Flock, MD   2 mg at 04/21/15 2327  . nitroGLYCERIN (NITROSTAT) SL tablet 0.4 mg  0.4 mg Sublingual Q5 min PRN Dustin Flock, MD      . prochlorperazine (COMPAZINE) tablet 10 mg  10 mg Oral Q6H PRN Dustin Flock, MD      . sodium chloride 0.9 % injection 3 mL  3 mL Intravenous Q12H Dustin Flock, MD   3 mL at 04/22/15 1307  . sodium chloride 0.9 % injection 3 mL  3 mL Intravenous PRN Dustin Flock, MD      . sucralfate (CARAFATE) tablet 1 g  1 g Oral TID WC & HS Dustin Flock, MD   1 g at 04/22/15 1206  . triamterene-hydrochlorothiazide (DYAZIDE) 37.5-25 MG per capsule 1 capsule  1 capsule Oral Daily Dustin Flock, MD   1 capsule at 04/22/15 1206  . vancomycin (VANCOCIN) IVPB 1000 mg/200 mL premix  1,000 mg Intravenous Q12H Henreitta Leber, MD   1,000 mg at 04/22/15 1304    OBJECTIVE: Filed Vitals:   04/22/15 1416  BP: 123/61  Pulse: 72  Temp: 98 F (36.7 C)  Resp: 20      Body mass index is 23.88 kg/(m^2).    ECOG FS:2 - Symptomatic, <50% confined to bed  General: Well-developed, well-nourished, no acute distress. Eyes: Pink conjunctiva, anicteric sclera. HEENT: Normocephalic, moist mucous membranes, clear oropharnyx. Lungs: Clear to auscultation bilaterally. Heart: Regular rate and rhythm. No rubs, murmurs, or gallops. Abdomen: Soft, nontender, nondistended. No organomegaly noted, normoactive bowel sounds. Musculoskeletal: No edema, cyanosis,  or clubbing. Neuro: Alert, answering all questions appropriately. Cranial nerves grossly intact. Skin: No rashes or petechiae noted. Psych: Normal affect. Lymphatics: No cervical, calvicular, axillary or inguinal LAD.   LAB RESULTS:  Lab Results  Component Value Date   NA 133* 04/22/2015   K 3.7 04/22/2015   CL 103 04/22/2015   CO2 24 04/22/2015   GLUCOSE 101* 04/22/2015   BUN 14 04/22/2015   CREATININE 0.77 04/22/2015   CALCIUM 8.0* 04/22/2015   PROT 6.6 04/21/2015   ALBUMIN 3.0* 04/21/2015   AST 16 04/21/2015   ALT 9* 04/21/2015   ALKPHOS 80 04/21/2015   BILITOT 1.2 04/21/2015   GFRNONAA >60 04/22/2015   GFRAA >60 04/22/2015    Lab Results  Component Value Date   WBC 6.3 04/22/2015   NEUTROABS 4.1 04/17/2015   HGB 7.4* 04/22/2015   HCT 21.7* 04/22/2015   MCV 114.6* 04/22/2015   PLT 111* 04/22/2015     STUDIES: Dg Chest 2 View  04/21/2015   CLINICAL DATA:  Acute onset of chest pain. Current history of left upper lobe and left lower lobe lung cancer.  EXAM: CHEST  2 VIEW  COMPARISON:  01/28/2015 and earlier, including PET-CT 01/22/2015 and CT chest 01/15/2015.  FINDINGS: Prior sternotomy for CABG. Cardiac silhouette normal in size, unchanged. Central left upper lobe lung mass in the perihilar region, increased in size since the most recent prior examination. Airspace opacities in the left lower lobe, adenocarcinoma, also increased since the prior examination. Severe emphysematous changes in the  upper lobes, unchanged. No new parenchymal abnormality elsewhere. Pulmonary vascularity normal without evidence of pulmonary edema. No pleural effusions.  IMPRESSION: 1. Enlarging central left upper lobe lung mass and a progressive airspace opacities throughout the left lower lobe, likely adenocarcinoma (I do not have available the results of the left lower lobe biopsy performed in April, 2016). Superimposed acute pneumonia could account for the increased airspace opacities. 2. No new abnormalities elsewhere in either lung. 3. Severe COPD/emphysema.   Electronically Signed   By: Evangeline Dakin M.D.   On: 04/21/2015 11:35   Ct Angio Chest Pe W/cm &/or Wo Cm  04/21/2015   CLINICAL DATA:  Dyspnea.  Chest pain.  History of lung cancer.  EXAM: CT ANGIOGRAPHY CHEST WITH CONTRAST  TECHNIQUE: Multidetector CT imaging of the chest was performed using the standard protocol during bolus administration of intravenous contrast. Multiplanar CT image reconstructions and MIPs were obtained to evaluate the vascular anatomy.  CONTRAST:  50m OMNIPAQUE IOHEXOL 350 MG/ML SOLN  COMPARISON:  Chest radiograph of 04/21/2015.  CT of 01/15/2015.  FINDINGS: Mediastinum/Nodes: The quality of this exam for evaluation of pulmonary embolism is sufficient. Left lower lobe pulmonary arterial tree is surrounded by tumor proximally, and completely occluded. No right-sided pulmonary artery enlargement.  The RA to LA ratio is 3.6 cm/ 5.8 cm, equal to 0.62, normal.  No supraclavicular adenopathy. No axillary adenopathy. Advanced aortic and branch vessel atherosclerosis. Ulcerative plaque in the descending thoracic aorta. Mild cardiomegaly, without pericardial effusion. No right hilar adenopathy. Extension of left upper lobe mass versus left hilar and infrahilar adenopathy, including at the origin of the left pulmonary artery measuring 2.4 x 2.6 cm on image 73.  Lungs/Pleura: Small left pleural effusion is new since the prior CT. Advanced  centrilobular and paraseptal emphysema. Mild motion degradation in the lung bases. Lower lobe predominant bronchial wall thickening.  New pulmonary nodules, including a right upper lobe 6 mm nodule on image 93 of series 7.  Right lower lobe pulmonary nodule of 1.0 cm on image 106 series 7.  Further enlargement of central left upper lobe lung mass, with direct extension into the AP window region. 4.3 x 4.5 cm on image 64 of series 5 versus 3.2 x 2.2 cm on the prior exam.  Patchy airspace disease involves the left lower lobe. Left-sided airways are patent but narrowed.  Upper abdomen: Scattered low-density liver lesions which are relatively well-circumscribed and felt to the similar to on the prior. Normal imaged portions of the spleen, stomach, gallbladder, right adrenal gland. Pancreatic atrophy. Left adrenal nodularity which is similar. Incompletely imaged left renal lesion is low-density and likely a cyst.  Musculoskeletal: mildly heterogeneous marrow density, favored to be related to osteopenia.  Review of the MIP images confirms the above findings.  IMPRESSION: 1. Marked progression of primary bronchogenic carcinoma. Enlargement of a central left upper lobe lung mass with continuous tumor and/or adenopathy extending in the left hilum and along the left lower lobe pulmonary arterial tree. 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 2. Left base airspace disease, suspicious for concurrent infection. 3. New Right-sided pulmonary nodules, most consistent with metastatic disease. 4. Small left pleural effusion. These results were called by telephone at the time of interpretation on 04/21/2015 at 2:22 pm to Marga Melnick, r.n., who verbally acknowledged these results.   Electronically Signed   By: Abigail Miyamoto M.D.   On: 04/21/2015 14:25   Dg Abd 2 Views  04/21/2015   CLINICAL DATA:  Generalized abdominal pain, constipation for 3-4 days.  EXAM: ABDOMEN - 2 VIEW  COMPARISON:  CT  01/28/2015  FINDINGS: Nonobstructive bowel gas pattern. Normal stool burden. No organomegaly or free air. No suspicious calcification. Vascular calcifications throughout the aorta and iliac vessels. No acute bony abnormality.  IMPRESSION: No evidence of bowel obstruction or free air.  Normal stool burden.   Electronically Signed   By: Rolm Baptise M.D.   On: 04/21/2015 12:26    ASSESSMENT: Squamous cell carcinoma of the lung.  PLAN:    1. Lung cancer: Patient is not a surgical candidate. Patient received cycle 2 of weekly carboplatinum and Taxol approximately one week ago. He is also receiving daily XRT. Patient's imaging as above reviewed independently and appears to have progression of disease. Patient had approximately 2 month delay in his treatment so this progression may very well have occurred prior to initiating treatment. Will hold chemotherapy and XRT at this time and patient should follow-up upon discharge for further evaluation and consideration of resumption of therapy. 2. Thrombus: Currently on heparin, patient would benefit from anticoagulation upon discharge. 3. Pneumonia: Postobstructive. Continue current antibiotics. 4. Suicide ideation: Patient did not admit to this, but his sister states that he has been talking about ending his life recently. Psychiatry has been consulted.  Appreciate consult, will follow.   Lloyd Huger, MD   04/22/2015 5:12 PM

## 2015-04-22 NOTE — Progress Notes (Signed)
Initial Nutrition Assessment  INTERVENTION:  Meals and Snacks: Cater to patient preferences Medical Food Supplement Therapy: will send Mighty Shakes TID and Magic cup at dinner for added nutrition.  (each supplement provides roughly 300kcals and 9g protein)    NUTRITION DIAGNOSIS:  Inadequate oral intake related to cancer and cancer related treatments as evidenced by per patient/family report.  GOAL:  Patient will meet greater than or equal to 90% of their needs  MONITOR:   (Energy Intake, Pulmonary Profile, Anthropometrics)  REASON FOR ASSESSMENT:  Consult Poor PO  ASSESSMENT:  Pt admitted with pulmonary emboli with h/o lung cancer actively on chemo and radiation, per Nsg note last treatment last Friday PTA. PMHx:  Past Medical History  Diagnosis Date  . Hyposmolality and/or hyponatremia   . Other and unspecified hyperlipidemia   . Headache(784.0)   . Backache, unspecified   . Allergic rhinitis, cause unspecified   . Tobacco use disorder   . Other constipation   . Myocardial infarction   . Hypertension   . Shortness of breath     with exertion, pt. remarks that he "gives out more from his legs than his breathing"   . Stroke     [facility]- pt. told he had a stroke, but he doesn't think he did   . Peripheral vascular disease, unspecified   . GERD (gastroesophageal reflux disease)     indigestion on occasion   . Arthritis     back, legs, knees   . A-fib   . Kidney stones     right kidney  . Coronary artery disease   . Dysrhythmia     a fib  . 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 COPD (chronic obstructive pulmonary disease)     Diet Order:  Diet regular Room service appropriate?: Yes; Fluid consistency:: Thin; Plastic utensils only  Current Nutrition: Pt ate 2 bites of breakfast and drank 100% of orange juice and coffee per sitter this am.   Food/Nutrition-Related History: Pt reports poor po intake PTA. Per family poor appetite over the past month  progressively worsening.   Medications: Carafate, heparin  Electrolyte/Renal Profile and Glucose Profile:   Recent Labs Lab 04/17/15 0908 04/21/15 1109 04/22/15 0438  NA 136 133* 133*  K 4.8 3.8 3.7  CL 106 97* 103  CO2 '24 25 24  '$ BUN '15 19 14  '$ CREATININE 0.98 0.91 0.77  CALCIUM 8.3* 8.5* 8.0*  MG  --  2.0  --   GLUCOSE 107* 132* 101*   Protein Profile:   Recent Labs Lab 04/17/15 0908 04/21/15 1109  ALBUMIN 3.5 3.0*   Nutritional Anemia Profile:  CBC Latest Ref Rng 04/22/2015 04/21/2015 04/17/2015  WBC 3.8 - 10.6 K/uL 6.3 11.7(H) 5.2  Hemoglobin 13.0 - 18.0 g/dL 7.4(L) 9.5(L) 10.6(L)  Hematocrit 40.0 - 52.0 % 21.7(L) 27.9(L) 31.4(L)  Platelets 150 - 440 K/uL 111(L) 156 146(L)   Gastrointestinal Profile: Last BM: 7/4   Nutrition-Focused Physical Exam Findings: Nutrition-Focused physical exam completed. Findings are WDL for fat depletion, muscle depletion, and edema.    Weight Change: Pt reports weight loss. Pt reports usual body weight of 165lbs but does not know when last weighed 165lbs. Per [facility] weight stable for the past 2 months.  Anthropometrics:   Height:  Ht Readings from Last 1 Encounters:  04/21/15 '5\' 8"'$  (1.727 m)    Weight:  Wt Readings from Last 1 Encounters:  04/21/15 157 lb (71.215 kg)     Wt Readings from Last 10  Encounters:  04/21/15 157 lb (71.215 kg)  04/17/15 158 lb 9.9 oz (71.95 kg)  04/09/15 158 lb 11.7 oz (72 kg)  04/02/15 158 lb 15.2 oz (72.099 kg)  03/26/15 159 lb 2.8 oz (72.2 kg)  03/11/15 159 lb 2.8 oz (72.2 kg)  02/21/15 158 lb (71.668 kg)  02/19/15 159 lb (72.122 kg)  12/10/14 170 lb 8 oz (77.338 kg)  12/03/13 156 lb 4 oz (70.875 kg)    BMI:  Body mass index is 23.88 kg/(m^2).  Estimated Nutritional Needs:  Kcal:  1873-2214kcals, BEE: 1420kcals, TEE: (IF 1.1-1.3)(AF 1.2)  Protein:  71-85g protein (1.0-1.2g/kg)  Fluid:  1775-2125m of fluid (25-363mkg)  Skin:  Reviewed, no issues  EDUCATION NEEDS:  No education  needs identified at this time    MOUniversity of California-DavisRD, LDN Pager [phone]

## 2015-04-22 NOTE — Progress Notes (Addendum)
ANTICOAGULATION CONSULT NOTE - Follow Up Consult  Pharmacy Consult for Heparin Indication: Pulmonary occlusion distal thrombus/treat like PE  No Known Allergies  Patient Measurements: Height: '5\' 8"'$  (172.7 cm) Weight: 157 lb (71.215 kg) IBW/kg (Calculated) : 68.4 Heparin Dosing Weight: 71.2 kg  Vital Signs: Temp: 98.9 F (37.2 C) (07/05 0523) Temp Source: Oral (07/05 0523) BP: 115/57 mmHg (07/05 0523) Pulse Rate: 78 (07/05 0523)  Labs:  Recent Labs  04/21/15 1109 04/21/15 1110 04/22/15 0118 04/22/15 0438 04/22/15 1034  HGB 9.5*  --   --  7.4*  --   HCT 27.9*  --   --  21.7*  --   PLT 156  --   --  111*  --   APTT  --  45*  --   --   --   LABPROT  --  16.0*  --   --   --   INR  --  1.26  --   --   --   HEPARINUNFRC  --   --  0.10*  --  0.16*  CREATININE 0.91  --   --  0.77  --   TROPONINI <0.03  --   --   --   --     Estimated Creatinine Clearance: 78.4 mL/min (by C-G formula based on Cr of 0.77).   Medications:  Scheduled:  . amiodarone  200 mg Oral BID  . aspirin  162 mg Oral Daily  . gabapentin  200 mg Oral TID  . levofloxacin (LEVAQUIN) IV  500 mg Intravenous Q24H  . loratadine  10 mg Oral BID  . metoprolol tartrate  25 mg Oral BID  . sodium chloride  3 mL Intravenous Q12H  . sucralfate  1 g Oral TID WC & HS  . triamterene-hydrochlorothiazide  1 capsule Oral Daily   Infusions:  . heparin 1,500 Units/hr (04/22/15 0217)   PRN: sodium chloride, acetaminophen **OR** acetaminophen, HYDROcodone-acetaminophen, ipratropium-albuterol, morphine injection, nitroGLYCERIN, prochlorperazine, sodium chloride  Assessment: Patient on heparin drip for pulmonary occlusion due to tumor from lung CA. Heparin level is below goal. No bolus per MD.   Goal of Therapy:  Heparin level 0.3-0.7 units/ml Monitor platelets by anticoagulation protocol: Yes   Plan:  Will increase heparin drip from 1500 to 1700 units/hr and recheck a HL in 8 h (at 2100 tonight).  CBC ordered for  AM.   Rocky Morel 04/22/2015,11:50 AM  Addendum: Damaris Schooner with MD (Dr. Verdell Carmine) during rounds about plt count drop from 156 to 111 and MD stated ok to continue heparin, likely due to pt's cancer.  Spoke with RN Stanton Kidney), no s/sx of bleeding noted.   Rayna Sexton, PharmD, BCPS Clinical Pharmacist 04/22/2015 1:42 PM

## 2015-04-22 NOTE — Plan of Care (Signed)
Problem: Discharge Progression Outcomes Goal: Other Discharge Outcomes/Goals Outcome: Progressing Plan of Care Progress to Goal:  Pt has very poor PO intake.  Has been calm and cooperative.  Psyche eval not done today - plan is for tomorrow.  On heparin drip inc to 17 ml/hr for PE.  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 Hgb was 7.4 -possibly due to heparin drip.  C/o of left side chest pain -usually a 6 and would be relived by Norco.  Pt ambulates w/1asst to BR.

## 2015-04-23 ENCOUNTER — Inpatient Hospital Stay: Payer: Commercial Managed Care - HMO

## 2015-04-23 ENCOUNTER — Ambulatory Visit: Payer: Commercial Managed Care - HMO

## 2015-04-23 ENCOUNTER — Ambulatory Visit
Admit: 2015-04-23 | Discharge: 2015-04-23 | Disposition: A | Discharge status: Home / Self Care | Payer: Commercial Managed Care - HMO | Attending: Radiation Oncology | Admitting: Radiation Oncology

## 2015-04-23 ENCOUNTER — Inpatient Hospital Stay: Payer: Commercial Managed Care - HMO | Attending: Oncology

## 2015-04-23 ENCOUNTER — Inpatient Hospital Stay: Payer: Commercial Managed Care - HMO | Admitting: Oncology

## 2015-04-23 DIAGNOSIS — F32A Depression, unspecified: Secondary | ICD-10-CM

## 2015-04-23 DIAGNOSIS — F329 Major depressive disorder, single episode, unspecified: Secondary | ICD-10-CM

## 2015-04-23 LAB — CBC
HEMATOCRIT: 21.9 % — AB (ref 40.0–52.0)
HEMOGLOBIN: 7.7 g/dL — AB (ref 13.0–18.0)
MCH: 39.6 pg — ABNORMAL HIGH (ref 26.0–34.0)
MCHC: 35 g/dL (ref 32.0–36.0)
MCV: 113.4 fL — ABNORMAL HIGH (ref 80.0–100.0)
Platelets: 129 10*3/uL — ABNORMAL LOW (ref 150–440)
RBC: 1.93 MIL/uL — ABNORMAL LOW (ref 4.40–5.90)
RDW: 16.2 % — ABNORMAL HIGH (ref 11.5–14.5)
WBC: 7.1 10*3/uL (ref 3.8–10.6)

## 2015-04-23 LAB — HEPARIN LEVEL (UNFRACTIONATED): HEPARIN UNFRACTIONATED: 0.24 [IU]/mL — AB (ref 0.30–0.70)

## 2015-04-23 MED ORDER — DOCUSATE SODIUM 100 MG PO CAPS
100.0000 mg | ORAL_CAPSULE | Freq: Two times a day (BID) | ORAL | Status: DC
  Administered 2015-04-23 – 2015-04-24 (×3): 100 mg via ORAL
  Filled 2015-04-23 (×3): qty 1

## 2015-04-23 MED ORDER — HEPARIN BOLUS VIA INFUSION
1100.0000 [IU] | Freq: Once | INTRAVENOUS | Status: DC
  Filled 2015-04-23: qty 1100

## 2015-04-23 MED ORDER — APIXABAN 5 MG PO TABS
10.0000 mg | ORAL_TABLET | Freq: Two times a day (BID) | ORAL | Status: DC
  Administered 2015-04-23 – 2015-04-24 (×3): 10 mg via ORAL
  Filled 2015-04-23 (×3): qty 2

## 2015-04-23 MED ORDER — APIXABAN 5 MG PO TABS: 5.0000 mg | ORAL_TABLET | Freq: Two times a day (BID) | ORAL | Status: DC

## 2015-04-23 MED ORDER — MIRTAZAPINE 15 MG PO TBDP
15.0000 mg | ORAL_TABLET | Freq: Every day | ORAL | Status: DC
Start: 2015-04-23 — End: 2015-04-24
  Administered 2015-04-23: 15 mg via ORAL
  Filled 2015-04-23: qty 1

## 2015-04-23 NOTE — Progress Notes (Signed)
   04/23/15 1730  Clinical Encounter Type  Visited With Patient and family together  Visit Type Initial  Spiritual Encounters  Spiritual Needs Other (Comment)  Stress Factors  Patient Stress Factors None identified  Family Stress Factors None identified  Chaplain Codi Kertz rounded in unit and offered pastoral care. Ext [phone]

## 2015-04-23 NOTE — Care Management (Signed)
Important Message  Patient Details  Name: Jerry Davenport MRN: [id number] Date of Birth: 1941/04/15   Medicare Important Message Given:  Yes-second notification given    Juliann Pulse A Allmond 04/23/2015, 11:01 AM

## 2015-04-23 NOTE — Progress Notes (Signed)
[facility] NAME: Jerry Davenport    MR#:  [id number]  DATE OF BIRTH:  May 29, 1941  SUBJECTIVE:  CHIEF COMPLAINT:   Chief Complaint  Patient presents with  . Shortness of Breath  . Chest Pain  . Constipation   patient's cough is persistent but nonproductive. Shortness of breath has improved. No hemoptysis. No night sweats.  REVIEW OF SYSTEMS:    Review of Systems  Constitutional: Negative for fever and chills.  HENT: Negative for congestion and tinnitus.   Eyes: Negative for blurred vision and double vision.  Respiratory: Positive for cough, shortness of breath and wheezing (improved). Negative for sputum production.   Cardiovascular: Negative for chest pain, orthopnea and PND.  Gastrointestinal: Negative for nausea, vomiting, abdominal pain and diarrhea.  Genitourinary: Negative for dysuria and hematuria.  Skin: Negative for rash.  Neurological: Positive for weakness (generalized). Negative for dizziness, sensory change and focal weakness.  Psychiatric/Behavioral: Negative for depression and suicidal ideas.  All other systems reviewed and are negative.   Nutrition: Regular Tolerating Diet: Yes Tolerating PT: Ambulatory   DRUG ALLERGIES:  No Known Allergies  VITALS:  Blood pressure 104/52, pulse 76, temperature 98.3 F (36.8 C), temperature source Oral, resp. rate 20, height '5\' 8"'$  (1.727 m), weight 71.215 kg (157 lb), SpO2 99 %.  PHYSICAL EXAMINATION:   Physical Exam  GENERAL:  74 y.o.-year-old patient lying in the bed in mild respiratory distress EYES: Pupils equal, round, reactive to light and accommodation. No scleral icterus. Extraocular muscles intact.  HEENT: Head atraumatic, normocephalic. Oropharynx and nasopharynx clear.  NECK:  Supple, no jugular venous distention. No thyroid enlargement, no tenderness.  LUNGS: Minimal rhonchi and wheezing end expiratory. Negative use of accessory muscles. No dullness to  percussion.  CARDIOVASCULAR: S1, S2 normal. No murmurs, rubs, or gallops.  ABDOMEN: Soft, nontender, nondistended. Bowel sounds present. No organomegaly or mass.  EXTREMITIES: No cyanosis, clubbing or edema b/l.    NEUROLOGIC: Cranial nerves II through XII are intact. No focal Motor or sensory deficits b/l.   PSYCHIATRIC: The patient is alert and oriented x 3.  SKIN: No obvious rash, lesion, or ulcer.    LABORATORY PANEL:   CBC  Recent Labs Lab 04/23/15 0434  WBC 7.1  HGB 7.7*  HCT 21.9*  PLT 129*   ------------------------------------------------------------------------------------------------------------------  Chemistries   Recent Labs Lab 04/21/15 1109 04/22/15 0438  NA 133* 133*  K 3.8 3.7  CL 97* 103  CO2 25 24  GLUCOSE 132* 101*  BUN 19 14  CREATININE 0.91 0.77  CALCIUM 8.5* 8.0*  MG 2.0  --   AST 16  --   ALT 9*  --   ALKPHOS 80  --   BILITOT 1.2  --    ------------------------------------------------------------------------------------------------------------------  Cardiac Enzymes  Recent Labs Lab 04/21/15 1109  TROPONINI <0.03   ------------------------------------------------------------------------------------------------------------------  RADIOLOGY:  Ct Angio Chest Pe W/cm &/or Wo Cm  04/21/2015   CLINICAL DATA:  Dyspnea.  Chest pain.  History of lung cancer.  EXAM: CT ANGIOGRAPHY CHEST WITH CONTRAST  TECHNIQUE: Multidetector CT imaging of the chest was performed using the standard protocol during bolus administration of intravenous contrast. Multiplanar CT image reconstructions and MIPs were obtained to evaluate the vascular anatomy.  CONTRAST:  56m OMNIPAQUE IOHEXOL 350 MG/ML SOLN  COMPARISON:  Chest radiograph of 04/21/2015.  CT of 01/15/2015.  FINDINGS: Mediastinum/Nodes: The quality of this exam for evaluation of pulmonary embolism is sufficient. Left lower lobe pulmonary  arterial tree is surrounded by tumor proximally, and completely  occluded. No right-sided pulmonary artery enlargement.  The RA to LA ratio is 3.6 cm/ 5.8 cm, equal to 0.62, normal.  No supraclavicular adenopathy. No axillary adenopathy. Advanced aortic and branch vessel atherosclerosis. Ulcerative plaque in the descending thoracic aorta. Mild cardiomegaly, without pericardial effusion. No right hilar adenopathy. Extension of left upper lobe mass versus left hilar and infrahilar adenopathy, including at the origin of the left pulmonary artery measuring 2.4 x 2.6 cm on image 73.  Lungs/Pleura: Small left pleural effusion is new since the prior CT. Advanced centrilobular and paraseptal emphysema. Mild motion degradation in the lung bases. Lower lobe predominant bronchial wall thickening.  New pulmonary nodules, including a right upper lobe 6 mm nodule on image 93 of series 7.  Right lower lobe pulmonary nodule of 1.0 cm on image 106 series 7.  Further enlargement of central left upper lobe lung mass, with direct extension into the AP window region. 4.3 x 4.5 cm on image 64 of series 5 versus 3.2 x 2.2 cm on the prior exam.  Patchy airspace disease involves the left lower lobe. Left-sided airways are patent but narrowed.  Upper abdomen: Scattered low-density liver lesions which are relatively well-circumscribed and felt to the similar to on the prior. Normal imaged portions of the spleen, stomach, gallbladder, right adrenal gland. Pancreatic atrophy. Left adrenal nodularity which is similar. Incompletely imaged left renal lesion is low-density and likely a cyst.  Musculoskeletal: mildly heterogeneous marrow density, favored to be related to osteopenia.  Review of the MIP images confirms the above findings.  IMPRESSION: 1. Marked progression of primary bronchogenic carcinoma. Enlargement of a central left upper lobe lung mass with continuous tumor and/or adenopathy extending in the left hilum and along the left lower lobe pulmonary arterial tree. 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 2. Left base airspace disease, suspicious for concurrent infection. 3. New Right-sided pulmonary nodules, most consistent with metastatic disease. 4. Small left pleural effusion. These results were called by telephone at the time of interpretation on 04/21/2015 at 2:22 pm to Marga Melnick, r.n., who verbally acknowledged these results.   Electronically Signed   By: Abigail Miyamoto M.D.   On: 04/21/2015 14:25     ASSESSMENT AND PLAN:   74 year old male with past medical history of lung cancer currently getting chemotherapy/radiation, chronic anemia, history of COPD, coronary disease, history of previous CVA, hypertension, who presented to the hospital due to chest pain shortness of breath and noted to have abnormal CT scan findings this interval. 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  #1 acute on chronic respiratory failure-this is likely multifactorial. Likely related to underlying lung cancer, pneumonia, chronic pulmonary thrombus. -will d/c IV Heparin and switch to Oral Eliquis today.  -Continue IV vancomycin, Levaquin for treatment for possible postobstructive pneumonia. -Patient likely needs to be assessed for home oxygen prior to discharge.  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 -Appreciate oncology input and will switch from IV heparin to Eliquis today. Discussed with Dr. Grayland Ormond  #3 pneumonia-this is likely postobstructive related to his underlying cancer -Continue IV Levaquin and IV vancomycin.  -Blood cultures negative so far. We'll DC on oral Levaquin.  #4 chronic anemia-likely related to patient's cancer and ongoing chemotherapy/radiation. -Follow serial hemoglobin. Transfuse if hemoglobin falls less than 7.  #5 history of chronic atrial fibrillation-patient is rate controlled. -Continue metoprolol, amiodarone.  #  6 lung cancer-likely stage IV  disease. Patient CT scan shows advancement of his cancer and also new lesions on the right side of his lung. -Prognosis is likely poor.  Continue care as per oncology.  #7 hypertension-hemodynamically stable. -Continue triamterene/HCTZ, metoprolol  #8 neuropathy-continue Neurontin.  Ambulate and assess for Home O2 prior to discharge.    All the records are reviewed and case discussed with Care Management/Social Workerr. Management plans discussed with the patient, family and they are in agreement.  CODE STATUS: DO NOT RESUSCITATE  DVT Prophylaxis: Eliquis TOTAL TIME TAKING CARE OF THIS PATIENT: 30 minutes.   POSSIBLE D/C home tomorrow.   Henreitta Leber M.D on 04/23/2015 at 12:38 PM  Between 7am to 6pm - Pager - [phone]  After 6pm go to [email or web address] - password [email or web address]  Office  [phone]  CC: Primary care physician; Dicky Doe, MD

## 2015-04-23 NOTE — Consult Note (Signed)
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 Pulmonary emboli [I26.99] 04/21/2015  . Lung cancer [C34.90] 03/24/2015  . Lung mass [R91.8] 02/21/2015  . Atrial flutter [I48.92] 12/10/2014  . Atrial fibrillation [I48.91] 08/30/2013  . Orthostatic hypotension [I95.1] 08/30/2013  . 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 Hematuria [R31.9] 08/07/2013  . CAD (coronary artery disease) [I25.10] 07/26/2013  . Chest pain [R07.9] 07/16/2013  . SOB (shortness of breath) [R06.02] 07/16/2013  . Smoker [Z72.0] 07/16/2013  . PAD (peripheral artery disease) [I73.9] 07/16/2013  . Hyperlipidemia [E78.5] 07/16/2013  . Blood in the urine [R31.9] 08/17/2012  . Back ache [M54.9] 08/17/2012  . CAFL (chronic airflow limitation) [J44.9] 08/17/2012  . 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 Cerebral infarct [I63.9] 08/17/2012  . Hay fever [J30.1] 08/17/2012  . BP (high blood pressure) [I10] 08/17/2012  . Hypercholesteremia [E78.0] 08/17/2012  . Diagnostic skin and sensitization tests [Z01.89] 08/17/2012  . Current tobacco use [Z72.0] 08/17/2012    Total Time spent with patient: 2 hours  Subjective:     HPI:  74 year old divorced Caucasian male from [place]. The patient presented to the emergency department on July 4 with complaints of shortness of breath and productive cough. 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 Grayland Ormond, who is actively undergoing radiation and chemotherapy.  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 Primary medical team is considering the involvement of palliative care.  Psychiatry was consulted after patient made some statement about suicide and homicide towards his son. The patient was interviewed today he reports feeling still weak and having significant pain on his rib cage and abdomen due to  pneumonia and shingles. He seems to have a good understanding of the reason why he is on the medical floor. He is aware of his diagnosis and was able to tell me he has been receiving chemotherapy and the ideation for about 5 weeks.  Despite the pain he rates his mood as 3 out of 10 x 10 being the worst.  He does complain of problems with appetite and is states that he has lost about 7-8 pounds over the last week. He denies having problems with sleep at home but is states he has not been sleeping well at the hospital due to the cough and due to the interruptions of his asleep by staff. He describes his energy as low he feels very tired and has not recovered his energy despite receiving antibiotics. Patient states that he has been having suicidal thoughts since the 90s after his first wife divorced him. He states he's never acted on these thoughts and currently does not have any intention about hurting himself or anybody else. He states that the reason why his never acted on it is because he is afraid of the pain and because he thinks about how his family will suffer. Patient does acknowledge making some statements about hurting himself and his son but is states he did not mean it. Patient  denies having auditory or visual hallucinations. He denies any symptoms congruent with hypomania or mania.  Patient is currently divorced from his third wife but there is still remain together in the same household. Patient states he has 2 guns in the house.  Patient states he lives guns as he on his first gun when he was 74 years old.  When I mention it  because needed to be either removed from the house or locked patient agree with having them locked and limited their access to his exwife.  Substance abuse history: Patient denies the use (current or in the past) of alcohol or any illicit substances. This was confirmed with the patient's sister and wife. He used to be of heavy smoker and smokes cigarettes for 60 years about 1 pack a day. He quit 1 year ago.  Collateral information was obtained from the patient's sister and the patient's wife.  The patient's sister is afraid for the safety of the patient as he has 2 guns in the house. He she states she has asked the patient's ex-wife to get the guns to her but she has decline. I contacted the patient's ex-wife who is states that the guns have been locked in a gun safe and she is the only one that has the access to the gun safe. She understands/aggress not to give the guns to him as he has mediate this is statements about hurting himself and his son. The patient's ex-wife is states that she does not think he really meant what he said.  Past psychiatric history: Patient denies any prior psychiatric history. Denies prior treatment for any kind of mental illness. Denies any history of suicidal attempts or self-injurious behaviors. This information was confirmed by the patient's sister and his ex-wife.  Past Medical History:  Past Medical History  Diagnosis Date  . Hyposmolality and/or hyponatremia   . Other and unspecified hyperlipidemia   . Headache(784.0)   . Backache, unspecified   . Allergic rhinitis, cause unspecified   . Tobacco use disorder   . Other constipation   . Myocardial infarction   . Hypertension   . Shortness of breath     with exertion, pt. remarks that he "gives out more from his legs than his breathing"   . Stroke     [facility]- pt. told he had a stroke, but he doesn't think he did   . Peripheral vascular disease, unspecified   . GERD (gastroesophageal reflux disease)      indigestion on occasion   . Arthritis     back, legs, knees   . A-fib   . Davenport stones     right Davenport  . Coronary artery disease   . Dysrhythmia     a fib  . 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 COPD (chronic obstructive pulmonary disease)     Past Surgical History  Procedure Laterality Date  . Hemorroidectomy    . Cardiac catheterization  2014    [facility]  . Knee surgery  1990's    [facility]- Left knee, cartilage removed   . Multiple tooth extractions  1980's  . Coronary artery bypass graft N/A 08/13/2013    Procedure: CORONARY ARTERY BYPASS GRAFTING (CABG);  Surgeon: Melrose Nakayama, MD;  Location: [facility];  Service: Open Heart Surgery;  Laterality: N/A;  CABG x three,  using left internal mammary artery and right leg greater saphenous vein harvested endoscopically   Family History:  Patient denies having any family history of  mental illness, substance abuse or suicides. Family confirmed that there is no history of mental illness or suicide in the family however they do report that the patient's son who is 11 years old suffers from drug addiction. Family History  Problem Relation Age of Onset  . Hypertension Mother    Social History:  Patient currently lives with his ex-wife. 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  He only has 1 son who is now 69 years old he was adopted.  The family reports that the son is a felon and abuses drugs. He reported there is significant conflict between the son and everybody else in the family. The ex-wife of stay she does not allow him to go in the house because she is afraid of him as he has made threats against her.  They stated that sometimes called the patient  frequently and asked for money.  Because of the conflict with the patient's son the patient made the statements about "getting rid of both of them" however once again the patient's wife does not think he meant it.  Patient denies any history of legal charges or history of violence disorder so was confirmed by interview with the family members.  History  Alcohol Use No    Comment: none for three months, reports he hasn't ever been a heavy drinker     History  Drug Use No    History   Social History  . Marital Status: Divorced    Spouse Name: N/A  . Number of Children: N/A  . Years of Education: N/A   Social History Main Topics  . Smoking status: Former Smoker -- 0.25 packs/day for 60 years    Types: Cigarettes    Quit date: 12/10/2013  . Smokeless tobacco: Never Used     Comment: down to 2-4 cigs a day now  . Alcohol Use: No     Comment: none for three months, reports he hasn't ever been a heavy drinker  . Drug Use: No  . Sexual Activity: Not on file   Other Topics Concern  . None   Social History Narrative     Allergies:  No Known Allergies  Labs:  Results for orders placed or performed during the hospital encounter of 04/21/15 (from the past 48 hour(s))  Troponin I     Status: None   Collection Time: 04/21/15 11:09 AM  Result Value Ref Range   Troponin I <0.03 <0.031 ng/mL    Comment:        NO INDICATION OF MYOCARDIAL INJURY.   CBC     Status: Abnormal   Collection Time: 04/21/15 11:09 AM  Result Value Ref Range   WBC 11.7 (H) 3.8 - 10.6 K/uL   RBC 2.42 (L) 4.40 - 5.90 MIL/uL   Hemoglobin 9.5 (L) 13.0 - 18.0 g/dL   HCT 27.9 (L) 40.0 - 52.0 %   MCV 115.2 (H) 80.0 - 100.0 fL   MCH 39.3 (H) 26.0 - 34.0 pg   MCHC 34.1 32.0 - 36.0 g/dL   RDW 16.7 (H) 11.5 - 14.5 %   Platelets 156 150 - 440 K/uL  Basic metabolic panel     Status: Abnormal   Collection Time: 04/21/15  11:09 AM  Result Value Ref Range   Sodium 133 (L) 135 - 145 mmol/L   Potassium 3.8 3.5 - 5.1  mmol/L   Chloride 97 (L) 101 - 111 mmol/L   CO2 25 22 - 32 mmol/L   Glucose, Bld 132 (H) 65 - 99 mg/dL   BUN 19 6 - 20 mg/dL   Creatinine, Ser 0.91 0.61 - 1.24 mg/dL   Calcium 8.5 (L) 8.9 - 10.3 mg/dL   GFR calc non Af Amer >60 >60 mL/min   GFR calc Af Amer >60 >60 mL/min    Comment: (NOTE) The eGFR has been calculated using the CKD EPI equation. This calculation has not been validated in all clinical situations. eGFR's persistently <60 mL/min signify possible Chronic Davenport Disease.    Anion gap 11 5 - 15  Hepatic function panel     Status: Abnormal   Collection Time: 04/21/15 11:09 AM  Result Value Ref Range   Total Protein 6.6 6.5 - 8.1 g/dL   Albumin 3.0 (L) 3.5 - 5.0 g/dL   AST 16 15 - 41 U/L   ALT 9 (L) 17 - 63 U/L   Alkaline Phosphatase 80 38 - 126 U/L   Total Bilirubin 1.2 0.3 - 1.2 mg/dL   Bilirubin, Direct 0.3 0.1 - 0.5 mg/dL   Indirect Bilirubin 0.9 0.3 - 0.9 mg/dL  Lipase, blood     Status: Abnormal   Collection Time: 04/21/15 11:09 AM  Result Value Ref Range   Lipase 20 (L) 22 - 51 U/L  Magnesium     Status: None   Collection Time: 04/21/15 11:09 AM  Result Value Ref Range   Magnesium 2.0 1.7 - 2.4 mg/dL  APTT     Status: Abnormal   Collection Time: 04/21/15 11:10 AM  Result Value Ref Range   aPTT 45 (H) 24 - 36 seconds    Comment:        IF BASELINE aPTT IS ELEVATED, SUGGEST PATIENT RISK ASSESSMENT BE USED TO DETERMINE APPROPRIATE ANTICOAGULANT THERAPY.   Protime-INR     Status: Abnormal   Collection Time: 04/21/15 11:10 AM  Result Value Ref Range   Prothrombin Time 16.0 (H) 11.4 - 15.0 seconds   INR 1.26   Blood culture (routine x 2)     Status: None (Preliminary result)   Collection Time: 04/21/15  1:07 PM  Result Value Ref Range   Specimen Description BLOOD LEFT ASSIST CONTROL    Special Requests BOTTLES DRAWN AEROBIC AND ANAEROBIC 5 CC    Culture NO GROWTH < 24 HOURS    Report Status PENDING   Blood culture (routine x 2)     Status: None  (Preliminary result)   Collection Time: 04/21/15  1:07 PM  Result Value Ref Range   Specimen Description BLOOD RIGHT ARM    Special Requests BOTTLES DRAWN AEROBIC AND ANAEROBIC 5 CC    Culture NO GROWTH < 24 HOURS    Report Status PENDING   Heparin level (unfractionated)     Status: Abnormal   Collection Time: 04/22/15  1:18 AM  Result Value Ref Range   Heparin Unfractionated 0.10 (L) 0.30 - 0.70 IU/mL    Comment:        IF HEPARIN RESULTS ARE BELOW EXPECTED VALUES, AND PATIENT DOSAGE HAS BEEN CONFIRMED, SUGGEST FOLLOW UP TESTING OF ANTITHROMBIN III LEVELS.   CBC     Status: Abnormal   Collection Time: 04/22/15  4:38 AM  Result Value Ref Range   WBC 6.3  3.8 - 10.6 K/uL   RBC 1.89 (L) 4.40 - 5.90 MIL/uL   Hemoglobin 7.4 (L) 13.0 - 18.0 g/dL    Comment: RESULT REPEATED AND VERIFIED   HCT 21.7 (L) 40.0 - 52.0 %   MCV 114.6 (H) 80.0 - 100.0 fL   MCH 39.3 (H) 26.0 - 34.0 pg   MCHC 34.3 32.0 - 36.0 g/dL   RDW 16.3 (H) 11.5 - 14.5 %   Platelets 111 (L) 150 - 440 K/uL  Basic metabolic panel     Status: Abnormal   Collection Time: 04/22/15  4:38 AM  Result Value Ref Range   Sodium 133 (L) 135 - 145 mmol/L   Potassium 3.7 3.5 - 5.1 mmol/L   Chloride 103 101 - 111 mmol/L   CO2 24 22 - 32 mmol/L   Glucose, Bld 101 (H) 65 - 99 mg/dL   BUN 14 6 - 20 mg/dL   Creatinine, Ser 0.77 0.61 - 1.24 mg/dL   Calcium 8.0 (L) 8.9 - 10.3 mg/dL   GFR calc non Af Amer >60 >60 mL/min   GFR calc Af Amer >60 >60 mL/min    Comment: (NOTE) The eGFR has been calculated using the CKD EPI equation. This calculation has not been validated in all clinical situations. eGFR's persistently <60 mL/min signify possible Chronic Davenport Disease.    Anion gap 6 5 - 15  Heparin level (unfractionated)     Status: Abnormal   Collection Time: 04/22/15 10:34 AM  Result Value Ref Range   Heparin Unfractionated 0.16 (L) 0.30 - 0.70 IU/mL    Comment:        IF HEPARIN RESULTS ARE BELOW EXPECTED VALUES, AND  PATIENT DOSAGE HAS BEEN CONFIRMED, SUGGEST FOLLOW UP TESTING OF ANTITHROMBIN III LEVELS.   Heparin level (unfractionated)     Status: Abnormal   Collection Time: 04/22/15  9:08 PM  Result Value Ref Range   Heparin Unfractionated 0.19 (L) 0.30 - 0.70 IU/mL    Comment:        IF HEPARIN RESULTS ARE BELOW EXPECTED VALUES, AND PATIENT DOSAGE HAS BEEN CONFIRMED, SUGGEST FOLLOW UP TESTING OF ANTITHROMBIN III LEVELS.   CBC     Status: Abnormal   Collection Time: 04/23/15  4:34 AM  Result Value Ref Range   WBC 7.1 3.8 - 10.6 K/uL   RBC 1.93 (L) 4.40 - 5.90 MIL/uL   Hemoglobin 7.7 (L) 13.0 - 18.0 g/dL   HCT 21.9 (L) 40.0 - 52.0 %   MCV 113.4 (H) 80.0 - 100.0 fL   MCH 39.6 (H) 26.0 - 34.0 pg   MCHC 35.0 32.0 - 36.0 g/dL   RDW 16.2 (H) 11.5 - 14.5 %   Platelets 129 (L) 150 - 440 K/uL  Heparin level (unfractionated)     Status: Abnormal   Collection Time: 04/23/15  4:34 AM  Result Value Ref Range   Heparin Unfractionated 0.24 (L) 0.30 - 0.70 IU/mL    Comment:        IF HEPARIN RESULTS ARE BELOW EXPECTED VALUES, AND PATIENT DOSAGE HAS BEEN CONFIRMED, SUGGEST FOLLOW UP TESTING OF ANTITHROMBIN III LEVELS.     Vitals: Blood pressure 131/63, pulse 76, temperature 97.7 F (36.5 C), temperature source Oral, resp. rate 20, height 5' 8"  (1.727 m), weight 71.215 kg (157 lb), SpO2 98 %.    Current Facility-Administered Medications  Medication Dose Route Frequency Provider Last Rate Last Dose  . 0.9 %  sodium chloride infusion  250 mL Intravenous PRN Dustin Flock, MD      .  acetaminophen (TYLENOL) tablet 650 mg  650 mg Oral Q6H PRN Dustin Flock, MD       Or  . acetaminophen (TYLENOL) suppository 650 mg  650 mg Rectal Q6H PRN Dustin Flock, MD      . amiodarone (PACERONE) tablet 200 mg  200 mg Oral BID Dustin Flock, MD   200 mg at 04/22/15 2127  . aspirin EC tablet 162 mg  162 mg Oral Daily Dustin Flock, MD   162 mg at 04/22/15 1204  . gabapentin (NEURONTIN) capsule 200 mg  200  mg Oral TID Dustin Flock, MD   200 mg at 04/22/15 2138  . heparin ADULT infusion 100 units/mL (25000 units/250 mL)  2,050 Units/hr Intravenous Continuous Henreitta Leber, MD 20.5 mL/hr at 04/23/15 0531 2,050 Units/hr at 04/23/15 0531  . HYDROcodone-acetaminophen (NORCO/VICODIN) 5-325 MG per tablet 1 tablet  1 tablet Oral Q6H PRN Dustin Flock, MD   1 tablet at 04/22/15 1716  . ipratropium-albuterol (DUONEB) 0.5-2.5 (3) MG/3ML nebulizer solution 3 mL  3 mL Nebulization Q4H PRN Dustin Flock, MD      . levofloxacin (LEVAQUIN) IVPB 750 mg  750 mg Intravenous Q24H Henreitta Leber, MD   750 mg at 04/22/15 1828  . loratadine (CLARITIN) tablet 10 mg  10 mg Oral Daily Henreitta Leber, MD      . metoprolol tartrate (LOPRESSOR) tablet 25 mg  25 mg Oral BID Dustin Flock, MD   25 mg at 04/22/15 1206  . morphine 2 MG/ML injection 2 mg  2 mg Intravenous Q3H PRN Dustin Flock, MD   2 mg at 04/21/15 2327  . nitroGLYCERIN (NITROSTAT) SL tablet 0.4 mg  0.4 mg Sublingual Q5 min PRN Dustin Flock, MD      . prochlorperazine (COMPAZINE) tablet 10 mg  10 mg Oral Q6H PRN Dustin Flock, MD   10 mg at 04/23/15 0717  . sodium chloride 0.9 % injection 3 mL  3 mL Intravenous Q12H Dustin Flock, MD   3 mL at 04/22/15 2147  . sodium chloride 0.9 % injection 3 mL  3 mL Intravenous PRN Dustin Flock, MD      . sucralfate (CARAFATE) tablet 1 g  1 g Oral TID WC & HS Dustin Flock, MD   1 g at 04/23/15 1740  . triamterene-hydrochlorothiazide (DYAZIDE) 37.5-25 MG per capsule 1 capsule  1 capsule Oral Daily Dustin Flock, MD   1 capsule at 04/22/15 1206  . vancomycin (VANCOCIN) IVPB 1000 mg/200 mL premix  1,000 mg Intravenous Q12H Henreitta Leber, MD   1,000 mg at 04/23/15 0011    Musculoskeletal: Strength & Muscle Tone: within normal limits Gait & Station: Patient was lying in bed and was not able to assess his gait Patient leans: N/A  Psychiatric Specialty Exam: Physical Exam  Review of Systems  HENT: Negative.    Eyes: Negative.   Respiratory: Positive for cough and shortness of breath.   Gastrointestinal: Negative.   Musculoskeletal:       Pain on chest, abdomen a  Skin: Negative.   Neurological: Positive for weakness. Negative for dizziness, tremors, sensory change, speech change, focal weakness and seizures.  Endo/Heme/Allergies: Negative.  Negative for environmental allergies and polydipsia. Does not bruise/bleed easily.  Psychiatric/Behavioral: Positive for suicidal ideas. Negative for depression, hallucinations, memory loss and substance abuse. The patient is not nervous/anxious and does not have insomnia.        Chronic suicidal thoughts since 1960's.    Blood pressure 131/63, pulse 76, temperature 97.7  F (36.5 C), temperature source Oral, resp. rate 20, height 5' 8"  (1.727 m), weight 71.215 kg (157 lb), SpO2 98 %.Body mass index is 23.88 kg/(m^2).  General Appearance: Fairly Groomed  Engineer, water::  Good  Speech:  Clear and Coherent and Normal Rate  Volume:  Normal  Mood:  Euthymic  Affect:  Appropriate and Congruent  Thought Process:  Linear and Logical  Orientation:  Full (Time, Place, and Person)  Thought Content:  Hallucinations: None  Suicidal Thoughts:  Yes.  without intent/planchronic since 1960  Homicidal Thoughts:  No  Memory:  Immediate;   Good Recent;   Good Remote;   Good  Judgement:  Good  Insight:  Good  Psychomotor Activity:  Decreased  Concentration:  Good  Recall:  NA  Fund of Knowledge:Good  Language: Good  Akathisia:  No  Handed:    AIMS (if indicated):     Assets:  Communication Skills Desire for Improvement Housing Social Support Vocational/Educational  ADL's:  Intact  Cognition: WNL  Sleep:      Medical Decision Making: New problem, with additional work up planned  Treatment Plan Summary:  Diagnosis: Unspecified depressive disorder ICD 10 F32.8 as patient does not meet full criteria for major depressive disorder.  Recommendations: My  recommendation would be to start the patient on a low dose of mirtazapine to address insomnia, appetite and nausea induced by chemotherapy and opiates. My recommendations have been discussed with the patient, his ex-wife and his sister in all voice understanding and agreement with the plan. At this point in time I do not feel the patient is in need of inpatient psychiatric hospitalization.  Removal of the guns has been discussed with the patient's sister and his ex-wife. Ex-wife reported to me today that the guns have been locked in a gun safe and she is the only person him with access to the safe.   Patient's ex-wife Jerry Davenport [phone] she and sister Jerry Davenport [phone]   Plan:  No evidence of imminent risk to self or others at present.   Patient does not meet criteria for psychiatric inpatient admission.   Disposition: as per hospitalist  Hildred Priest 04/23/2015 9:51 AM

## 2015-04-23 NOTE — Progress Notes (Signed)
ANTICOAGULATION CONSULT NOTE - Follow Up Consult  Pharmacy Consult for Heparin Indication: Pulmonary occlusion distal thrombus/treat like PE  No Known Allergies  Patient Measurements: Height: '5\' 8"'$  (172.7 cm) Weight: 157 lb (71.215 kg) IBW/kg (Calculated) : 68.4 Heparin Dosing Weight: 71.2 kg  Vital Signs: Temp: 97.7 F (36.5 C) (07/06 0447) Temp Source: Oral (07/06 0447) BP: 131/63 mmHg (07/06 0447) Pulse Rate: 76 (07/06 0447)  Labs:  Recent Labs  04/21/15 1109 04/21/15 1110  04/22/15 0438 04/22/15 1034 04/22/15 2108 04/23/15 0434  HGB 9.5*  --   --  7.4*  --   --  7.7*  HCT 27.9*  --   --  21.7*  --   --  21.9*  PLT 156  --   --  111*  --   --  129*  APTT  --  45*  --   --   --   --   --   LABPROT  --  16.0*  --   --   --   --   --   INR  --  1.26  --   --   --   --   --   HEPARINUNFRC  --   --   < >  --  0.16* 0.19* 0.24*  CREATININE 0.91  --   --  0.77  --   --   --   TROPONINI <0.03  --   --   --   --   --   --   < > = values in this interval not displayed.  Estimated Creatinine Clearance: 78.4 mL/min (by C-G formula based on Cr of 0.77).   Medications:  Scheduled:  . amiodarone  200 mg Oral BID  . aspirin  162 mg Oral Daily  . gabapentin  200 mg Oral TID  . levofloxacin (LEVAQUIN) IV  750 mg Intravenous Q24H  . loratadine  10 mg Oral Daily  . metoprolol tartrate  25 mg Oral BID  . sodium chloride  3 mL Intravenous Q12H  . sucralfate  1 g Oral TID WC & HS  . triamterene-hydrochlorothiazide  1 capsule Oral Daily  . vancomycin  1,000 mg Intravenous Q12H   Infusions:  . heparin 1,900 Units/hr (04/23/15 0511)   PRN: sodium chloride, acetaminophen **OR** acetaminophen, HYDROcodone-acetaminophen, ipratropium-albuterol, morphine injection, nitroGLYCERIN, prochlorperazine, sodium chloride  Assessment: Patient on heparin drip for pulmonary occlusion due to tumor from lung CA. Heparin level is below goal. No bolus per MD.  Goal of Therapy:  Heparin level  0.3-0.7 units/ml Monitor platelets by anticoagulation protocol: Yes   Plan:  Will increase heparin drip to 2050 units/hr and recheck HL in 8 h.   Ulice Dash D 04/23/2015,5:28 AM

## 2015-04-23 NOTE — Plan of Care (Signed)
Problem: Discharge Progression Outcomes Goal: Other Discharge Outcomes/Goals Outcome: Progressing Patient is alert and oriented, withdrawn. C/o pain in his chest, declines pain medication. Heparin drip increased to 19 ml/hr. Sitter remains at bedside. Encourage PO intake.

## 2015-04-23 NOTE — Progress Notes (Signed)
   04/23/15 1045  Clinical Encounter Type  Visited With Patient and family together  Visit Type Initial  Consult/Referral To Chaplain  Spiritual Encounters  Spiritual Needs Emotional  Visited with patient and family.  Pastoral support, compassion and presence.  Pt said he was ok and hoping to go home in the next few days.  Pt's family member also spoke to me in the hallway.  Asked me to pray for pt and to "make him a [place]." He told me pt wants to or appears to, but has not done so yet.  I told family member I would do whatever I could and would keep him in prayers. Family member understood, but appears to want me to do whatever it takes so that family member doesn't "lose his family."  I told family member that I would do what I could and the family would be in my prayers.  Family member and patient thanked me for coming.  Jerry Davenport [phone]

## 2015-04-23 NOTE — Care Management (Signed)
Admitted to [facility] with the diagnosis of Pulmonary emboli/Sucidal precautions. Lives with wife, Jerry Davenport [phone]). Sister is Jerry Davenport [phone]). Goes to [facility] for radiation. Diagnosed with Stage IV Lung Cancer. Sees Dr. Luan Pulling as primary care physician. Takes care of all activities of daily living himself. Uses no aids for ambulation. Sister, Jerry Davenport is at the bedside. Heparin Drip continues. Oncology and Psychiatric consult. IV Levaquin continues. Hgb 7.7 today. Shelbie Ammons RN MSN Care Management [phone]

## 2015-04-23 NOTE — Plan of Care (Signed)
Problem: Discharge Progression Outcomes Goal: Other Discharge Outcomes/Goals Outcome: Progressing Plan of Care Progress to Goal: Pt had psyche consult.  Not considered a safety risk to self or others and was taken off of suicide precautions. Pt has been calm and cooperative.  Heparin drip stopped and apixaban started.  No radiation today probably b/c of heparin drip. Hgb inc to 7.7 from 7.4.  There's an order to transfuse if it falls below 7.

## 2015-04-24 ENCOUNTER — Ambulatory Visit: Payer: Commercial Managed Care - HMO

## 2015-04-24 ENCOUNTER — Ambulatory Visit: Payer: Commercial Managed Care - HMO | Admitting: Oncology

## 2015-04-24 ENCOUNTER — Other Ambulatory Visit: Payer: Commercial Managed Care - HMO

## 2015-04-24 ENCOUNTER — Ambulatory Visit
Admission: RE | Admit: 2015-04-24 | Discharge: 2015-04-24 | Disposition: A | Discharge status: Home / Self Care | Payer: Commercial Managed Care - HMO | Source: Ambulatory Visit | Attending: Radiation Oncology | Admitting: Radiation Oncology

## 2015-04-24 DIAGNOSIS — Z510 Encounter for antineoplastic radiation therapy: Secondary | ICD-10-CM | POA: Diagnosis not present

## 2015-04-24 LAB — CBC
HCT: 23.1 % — ABNORMAL LOW (ref 40.0–52.0)
HEMOGLOBIN: 8 g/dL — AB (ref 13.0–18.0)
MCH: 39.5 pg — AB (ref 26.0–34.0)
MCHC: 34.8 g/dL (ref 32.0–36.0)
MCV: 113.5 fL — ABNORMAL HIGH (ref 80.0–100.0)
Platelets: 130 10*3/uL — ABNORMAL LOW (ref 150–440)
RBC: 2.03 MIL/uL — ABNORMAL LOW (ref 4.40–5.90)
RDW: 16.5 % — ABNORMAL HIGH (ref 11.5–14.5)
WBC: 6.8 10*3/uL (ref 3.8–10.6)

## 2015-04-24 LAB — VANCOMYCIN, TROUGH: Vancomycin Tr: 17 ug/mL (ref 10–20)

## 2015-04-24 MED ORDER — APIXABAN 5 MG PO TABS: 10.0000 mg | ORAL_TABLET | Freq: Two times a day (BID) | ORAL | Status: DC

## 2015-04-24 MED ORDER — MIRTAZAPINE 15 MG PO TBDP: 15.0000 mg | ORAL_TABLET | Freq: Every day | ORAL | Status: DC

## 2015-04-24 MED ORDER — APIXABAN 5 MG PO TABS: 5.0000 mg | ORAL_TABLET | Freq: Two times a day (BID) | ORAL | Status: DC

## 2015-04-24 MED ORDER — LEVOFLOXACIN 750 MG PO TABS: 750.0000 mg | ORAL_TABLET | Freq: Every day | ORAL | Status: AC

## 2015-04-24 MED ORDER — PREDNISONE 10 MG PO TABS: ORAL_TABLET | ORAL | Status: DC

## 2015-04-24 NOTE — Progress Notes (Signed)
ANTIBIOTIC CONSULT NOTE - FOLLOW UP  Pharmacy Consult for Vancomycin Indication: pneumonia Hx lung cancer  No Known Allergies  Patient Measurements: Height: '5\' 8"'$  (172.7 cm) Weight: 157 lb (71.215 kg) IBW/kg (Calculated) : 68.4  Vital Signs: Temp: 99.6 F (37.6 C) (07/06 2155) Temp Source: Oral (07/06 2155) BP: 96/44 mmHg (07/06 2155) Pulse Rate: 73 (07/06 2155) Intake/Output from previous day: 07/06 0701 - 07/07 0700 In: 123 [P.O.:120; I.V.:3] Out: 500 [Urine:500] Intake/Output from this shift: Total I/O In: -  Out: 500 [Urine:500]  Labs:  Recent Labs  04/21/15 1109 04/22/15 0438 04/23/15 0434  WBC 11.7* 6.3 7.1  HGB 9.5* 7.4* 7.7*  PLT 156 111* 129*  CREATININE 0.91 0.77  --    Estimated Creatinine Clearance: 78.4 mL/min (by C-G formula based on Cr of 0.77).  Recent Labs  04/23/15 2353  [place]     Microbiology: Recent Results (from the past 720 hour(s))  Blood culture (routine x 2)     Status: None (Preliminary result)   Collection Time: 04/21/15  1:07 PM  Result Value Ref Range Status   Specimen Description BLOOD LEFT ASSIST CONTROL  Final   Special Requests BOTTLES DRAWN AEROBIC AND ANAEROBIC 5 CC  Final   Culture NO GROWTH < 24 HOURS  Final   Report Status PENDING  Incomplete  Blood culture (routine x 2)     Status: None (Preliminary result)   Collection Time: 04/21/15  1:07 PM  Result Value Ref Range Status   Specimen Description BLOOD RIGHT ARM  Final   Special Requests BOTTLES DRAWN AEROBIC AND ANAEROBIC 5 CC  Final   Culture NO GROWTH < 24 HOURS  Final   Report Status PENDING  Incomplete    Anti-infectives    Start     Dose/Rate Route Frequency Ordered Stop   04/22/15 1800  levofloxacin (LEVAQUIN) IVPB 750 mg     750 mg 100 mL/hr over 90 Minutes Intravenous Every 24 hours 04/22/15 1340     04/22/15 1200  vancomycin (VANCOCIN) IVPB 1000 mg/200 mL premix     1,000 mg 200 mL/hr over 60 Minutes Intravenous Every 12 hours 04/22/15  1159     04/21/15 1545  levofloxacin (LEVAQUIN) IVPB 500 mg  Status:  Discontinued     500 mg 100 mL/hr over 60 Minutes Intravenous Every 24 hours 04/21/15 1536 04/22/15 1340   04/21/15 1311  vancomycin (VANCOCIN) 1 GM/200ML IVPB    Comments:  STEIN BROOMER OLIVIA: cabinet override      04/21/15 1311 04/21/15 1525   04/21/15 1300  cefTAZidime (FORTAZ) 2 g in dextrose 5 % 50 mL IVPB     2 g 100 mL/hr over 30 Minutes Intravenous  Once 04/21/15 1247 04/21/15 1658   04/21/15 1300  vancomycin (VANCOCIN) injection 1 g     1 g Intravenous  Once 04/21/15 1247 04/21/15 1319   04/21/15 1300  levofloxacin (LEVAQUIN) IVPB 750 mg     750 mg 100 mL/hr over 90 Minutes Intravenous  Once 04/21/15 1247 04/21/15 1430      Assessment: Patient on vancomycin and Levaquin for possible post-obstructive PNA with vancomycin trough at goal.   Goal of Therapy:  Vancomycin trough level 15-20 mcg/ml  Plan:  Will continue vancomycin 1000 mg iv q 12 h and follow renal function/check subsequent levels as clinically indicated.   Ulice Dash D 04/24/2015,12:52 AM

## 2015-04-24 NOTE — Progress Notes (Signed)
Patient does not want to see his son Erlene Quan. Note on door for visitors to go to nurses desk before entering.

## 2015-04-24 NOTE — Discharge Instructions (Signed)
°  DIET:  °Cardiac diet ° °DISCHARGE CONDITION:  °Stable ° °ACTIVITY:  °Activity as tolerated ° °OXYGEN:  °Home Oxygen: No. °  °Oxygen Delivery: room air ° °DISCHARGE LOCATION:  °Home with Home health PT  ° °If you experience worsening of your admission symptoms, develop shortness of breath, life threatening emergency, suicidal or homicidal thoughts you must seek medical attention immediately by calling 911 or calling your MD immediately  if symptoms less severe. ° °You Must read complete instructions/literature along with all the possible adverse reactions/side effects for all the Medicines you take and that have been prescribed to you. Take any new Medicines after you have completely understood and accpet all the possible adverse reactions/side effects.  ° °Please note ° °You were cared for by a hospitalist during your hospital stay. If you have any questions about your discharge medications or the care you received while you were in the hospital after you are discharged, you can call the unit and asked to speak with the hospitalist on call if the hospitalist that took care of you is not available. Once you are discharged, your primary care physician will handle any further medical issues. Please note that NO REFILLS for any discharge medications will be authorized once you are discharged, as it is imperative that you return to your primary care physician (or establish a relationship with a primary care physician if you do not have one) for your aftercare needs so that they can reassess your need for medications and monitor your lab values. ° ° ° °

## 2015-04-24 NOTE — Evaluation (Signed)
Physical Therapy Evaluation Patient Details Name: Jerry Davenport MRN: [id number] DOB: 04/14/1941 Today's Date: 04/24/2015   History of Present Illness  Quame Spratlin  is a 74 y.o. 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 Grayland Ormond, who is actively undergoing radiation and chemotherapy treatment presents to the ER with persistent left-sided chest pain for 2-3 days and worsening shortness of breath that has become progressive.  Complains of thick productive cough, increased shortness of breath especially with exertion, and subjective fever and chills.  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 And also left base airspace disease was noted. At baseline pt reports independence with ADLs and assist from ex-wife with IADLs. He has been using a rolling walker for ambulation at home. Denies falls in the last 12 months.   Clinical Impression  Pt demonstrates reasonable stability with bed mobility, transfers, and ambulation. He does have some mild to moderate lateral gait deviations and would be considerably safer using a rolling walker at discharge. Evaluation is somewhat limited due to patient's unwillingness to participate following ambulation. SaO2 remains >90% during ambulation on room air and pt does not qualify for home O2. Attempted exercises with patient however pt refuses further participation. Pt will benefit from skilled PT services to address deficits in strength, balance, and mobility in order to return to full function at home.     Follow Up Recommendations Home health PT    Equipment Recommendations  None recommended by PT (use walker at all times when returning home)    Recommendations for Other Services       Precautions / Restrictions  Precautions Precautions: Fall Restrictions Weight Bearing Restrictions: No      Mobility  Bed Mobility Overal bed mobility: Independent                Transfers Overall transfer level: Independent Equipment used: None             General transfer comment: Good safety, sequencing, and stability noted  Ambulation/Gait Ambulation/Gait assistance: Min guard Ambulation Distance (Feet): 60 Feet Assistive device: None     Gait velocity interpretation: <1.8 ft/sec, indicative of risk for recurrent falls General Gait Details: Pt with staggering steps during ambulation, infrequently crossing midline. Mild to moderate lateral gait deviations. Pt reaches for hand rail on wall. Appears generally unstable but no overt LOB. Pt advised to use rollignw alker at all times for ambulation at home.   Stairs            Wheelchair Mobility    Modified Rankin (Stroke Patients Only)       Balance Overall balance assessment: Needs assistance   Sitting balance-Leahy Scale: Fair       Standing balance-Leahy Scale: Fair                               Pertinent Vitals/Pain Pain Assessment: 0-10 Pain Score: 8  Pain Location: L flank, only with coughing. Denies pain at rest Pain Descriptors / Indicators: Sharp Pain Intervention(s): Monitored during session    Home Living Family/patient expects to be discharged to:: Private residence Living Arrangements: Spouse/significant other Available Help at Discharge: Family Type of Home: Mobile  home Home Access: Stairs to enter Entrance Stairs-Rails: Can reach both Entrance Stairs-Number of Steps: 4   Home Equipment: Walker - 2 wheels;Cane - single point;Other (comment);Shower seat (tub shower, no grab bars)      Prior Function Level of Independence: Independent               Hand Dominance        Extremity/Trunk Assessment   Upper Extremity Assessment: Overall WFL for tasks assessed;Generalized  weakness (At least 4+/5 throughout. Poor effor provided)           Lower Extremity Assessment: Overall WFL for tasks assessed;Generalized weakness (At least 4+/5 throughout except for 4-/5 hip flexion)         Communication   Communication: No difficulties  Cognition Arousal/Alertness: Awake/alert Behavior During Therapy: WFL for tasks assessed/performed Overall Cognitive Status: Within Functional Limits for tasks assessed                      General Comments General comments (skin integrity, edema, etc.): Attempted further balance assessment but pt refuses to participate secondary to fatigue.     Exercises        Assessment/Plan    PT Assessment Patient needs continued PT services  PT Diagnosis Difficulty walking;Abnormality of gait;Generalized weakness   PT Problem List Decreased strength;Decreased activity tolerance;Decreased balance;Decreased mobility;Cardiopulmonary status limiting activity  PT Treatment Interventions DME instruction;Gait training;Stair training;Functional mobility training;Therapeutic activities;Therapeutic exercise;Balance training;Neuromuscular re-education;Patient/family education   PT Goals (Current goals can be found in the Care Plan section) Acute Rehab PT Goals Patient Stated Goal: "I want to be able to cough up this stuff in my lungs." Pt would like to return home at discharge PT Goal Formulation: With patient Time For Goal Achievement: 05/02/2015 Potential to Achieve Goals: Fair    Frequency Min 2X/week   Barriers to discharge        Co-evaluation               End of Session Equipment Utilized During Treatment: Gait belt Activity Tolerance: Patient limited by fatigue;Other (comment) (Poor motivation) Patient left: in bed;with bed alarm set;with call bell/phone within reach           Time: 0848-0900 PT Time Calculation (min) (ACUTE ONLY): 12 min   Charges:   PT Evaluation $Initial PT Evaluation Tier I: 1  Procedure     PT G Codes:       Lyndel Safe Louine Tenpenny PT, DPT   Elyse Prevo 04/24/2015, 9:23 AM

## 2015-04-24 NOTE — Evaluation (Signed)
Physical Therapy Evaluation Patient Details Name: Jerry Davenport MRN: [id number] DOB: 1941/08/06 Today's Date: 04/24/2015   History of Present Illness  Jerry Davenport  is a 74 y.o. 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 Grayland Ormond, who is actively undergoing radiation and chemotherapy treatment presents to the ER with persistent left-sided chest pain for 2-3 days and worsening shortness of breath that has become progressive.  Complains of thick productive cough, increased shortness of breath especially with exertion, and subjective fever and chills.  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 And also left base airspace disease was noted. At baseline pt reports independence with ADLs and assist from ex-wife with IADLs. He has been using a rolling walker for ambulation at home. Denies falls in the last 12 months.   Clinical Impression  Pt demonstrates mild to moderate lateral gait deviations during ambulation. SaO2 remains >90% on room air with exertion and pt does not qualify for home O2 at discharge. Pt encouraged to use rolling walker at all times after discharge. Evaluation is limited due to patient's lack of willingness to continue following ambulation. Encouraged exercises with therapist but pt refuses. Pt will benefit from skilled PT services to address deficits in strength, balance, and mobility in order to return to full function at home.     Follow Up Recommendations Home health PT    Equipment Recommendations  None recommended by PT (use walker at all times when returning home)    Recommendations for Other Services       Precautions / Restrictions Precautions Precautions: Fall Restrictions Weight Bearing Restrictions: No       Mobility  Bed Mobility Overal bed mobility: Independent                Transfers Overall transfer level: Independent Equipment used: None             General transfer comment: Good safety, sequencing, and stability noted  Ambulation/Gait Ambulation/Gait assistance: Min guard Ambulation Distance (Feet): 60 Feet Assistive device: None     Gait velocity interpretation: <1.8 ft/sec, indicative of risk for recurrent falls General Gait Details: Pt with staggering steps during ambulation, infrequently crossing midline. Mild to moderate lateral gait deviations. Pt reaches for hand rail on wall. Appears generally unstable but no overt LOB. Pt advised to use rollignw alker at all times for ambulation at home.   Stairs            Wheelchair Mobility    Modified Rankin (Stroke Patients Only)       Balance Overall balance assessment: Needs assistance   Sitting balance-Leahy Scale: Fair       Standing balance-Leahy Scale: Fair                               Pertinent Vitals/Pain Pain Assessment: 0-10 Pain Score: 8  Pain Location: L flank, only with coughing. Denies pain at rest Pain Descriptors / Indicators: Sharp Pain Intervention(s): Monitored during session    Home Living Family/patient expects to be discharged to:: Private residence Living Arrangements: Spouse/significant other Available Help at Discharge: Family Type of Home: Mobile home Home Access: Stairs to enter Entrance Stairs-Rails: Can  reach both Entrance Stairs-Number of Steps: 4   Home Equipment: Walker - 2 wheels;Cane - single point;Other (comment);Shower seat (tub shower, no grab bars)      Prior Function Level of Independence: Independent               Hand Dominance        Extremity/Trunk Assessment   Upper Extremity Assessment: Overall WFL for tasks assessed;Generalized weakness (At least 4+/5 throughout. Poor effor provided)           Lower Extremity  Assessment: Overall WFL for tasks assessed;Generalized weakness (At least 4+/5 throughout except for 4-/5 hip flexion)         Communication   Communication: No difficulties  Cognition Arousal/Alertness: Awake/alert Behavior During Therapy: WFL for tasks assessed/performed Overall Cognitive Status: Within Functional Limits for tasks assessed                      General Comments General comments (skin integrity, edema, etc.): Attempted further balance assessment but pt refuses to participate secondary to fatigue.     Exercises        Assessment/Plan    PT Assessment Patient needs continued PT services  PT Diagnosis Difficulty walking;Abnormality of gait;Generalized weakness   PT Problem List Decreased strength;Decreased activity tolerance;Decreased balance;Decreased mobility;Cardiopulmonary status limiting activity  PT Treatment Interventions DME instruction;Gait training;Stair training;Functional mobility training;Therapeutic activities;Therapeutic exercise;Balance training;Neuromuscular re-education;Patient/family education   PT Goals (Current goals can be found in the Care Plan section) Acute Rehab PT Goals Patient Stated Goal: "I want to be able to cough up this stuff in my lungs." Pt would like to return home at discharge PT Goal Formulation: With patient Time For Goal Achievement: 05/13/2015 Potential to Achieve Goals: Fair    Frequency Min 2X/week   Barriers to discharge        Co-evaluation               End of Session Equipment Utilized During Treatment: Gait belt Activity Tolerance: Patient limited by fatigue;Other (comment) (Poor motivation) Patient left: in bed;with bed alarm set;with call bell/phone within reach           Time: 0848-0900 PT Time Calculation (min) (ACUTE ONLY): 12 min   Charges:   PT Evaluation $Initial PT Evaluation Tier I: 1 Procedure     PT G Codes:        Azure Budnick Apr 28, 2015, 9:29 AM

## 2015-04-24 NOTE — Care Management (Signed)
Discharge to home today per Dr. Verdell Carmine. Physical therapy evaluation completed.,  Recommends home with home health/physical therapy. Unsure if Mr. Donaghey will be going to his home or his daughter's home. Would like [facility] for services. Will call/fax information to [facility] when discharge destination ha beeb dtermined. Family will transport. Shelbie Ammons RN MSN Care Management [phone]

## 2015-04-24 NOTE — Progress Notes (Addendum)
Patient discharged home with home health per MD order. Prescriptions given to patient and all questions answered regarding medications. Patient stated "he was afraid to travel between hospital and his ex wife's house because of his son". Police was called for escort. Patient's brother and sister provided transportation.

## 2015-04-24 NOTE — Plan of Care (Signed)
Problem: Discharge Progression Outcomes Goal: Other Discharge Outcomes/Goals Outcome: Progressing Patient is alert and oriented, no c/o pain at this time. Able to ambulate to bathroom and back with no shortness of breath, standby assist. Continue with antibiotics, resting quietly.

## 2015-04-24 NOTE — Discharge Summary (Signed)
[facility] [place] NAME: Jerry Davenport    MR#:  [id number]  DATE OF BIRTH:  1941-07-22  DATE OF ADMISSION:  04/21/2015 ADMITTING PHYSICIAN: Dustin Flock, MD  DATE OF DISCHARGE: 04/24/2015  PRIMARY CARE PHYSICIAN: Dicky Doe, MD    ADMISSION DIAGNOSIS:  Thrombus [I82.90] Healthcare-associated pneumonia [J18.9] Chest pain [R07.9] Metastatic lung cancer (metastasis from lung to other site), unspecified laterality [C34.90, C79.9]  DISCHARGE DIAGNOSIS:  Principal Problem:   Pulmonary emboli Active Problems:   Depressive disorder   SECONDARY DIAGNOSIS:   Past Medical History  Diagnosis Date  . Hyposmolality and/or hyponatremia   . Other and unspecified hyperlipidemia   . Headache(784.0)   . Backache, unspecified   . Allergic rhinitis, cause unspecified   . Tobacco use disorder   . Other constipation   . Myocardial infarction   . Hypertension   . Shortness of breath     with exertion, pt. remarks that he "gives out more from his legs than his breathing"   . Stroke     [facility]- pt. told he had a stroke, but he doesn't think he did   . Peripheral vascular disease, unspecified   . GERD (gastroesophageal reflux disease)     indigestion on occasion   . Arthritis     back, legs, knees   . A-fib   . Kidney stones     right kidney  . Coronary artery disease   . Dysrhythmia     a fib  . 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  #1 acute on chronic respiratory  failure-this is likely multifactorial. Likely related to underlying lung cancer, pneumonia, chronic pulmonary thrombus. -Patient was treated with broad-spectrum IV antibiotics with vancomycin and Levaquin for the pneumonia, started on IV heparin for the pulmonary embolism. Patient was also given IV steroids for bronchospasm. -Patient has clinically improved still has a cough which is productive. Patient presently is being discharged home on oral Eliquis, oral Levaquin for the pneumonia and prednisone taper.  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 -Patient was initially treated with IV heparin nomogram. Not being discharged on oral Eliquis. -Patient will continue follow-up with his oncologist Dr. Grayland Ormond.  #3 pneumonia-this is likely postobstructive related to his underlying cancer -Patient remained afebrile, hemodynamically stable. Blood cultures are negative. Empirically patient was on IV vancomycin and Levaquin but presently is being discharged on oral Levaquin.  #4 chronic anemia-likely related to patient's cancer and ongoing chemotherapy/radiation. -Patient's hemoglobin remained stable in the hospital. Follow-up with oncology as outpatient.  #5 history of chronic atrial fibrillation-patient remained rate controlled. -Continue metoprolol, amiodarone.  #6 lung cancer-likely stage IV disease. Patient CT scan shows advancement of his cancer and also new lesions on the right side of his lung. -Prognosis is likely poor.Patient was seen by oncology by Dr. Grayland Ormond and the plan was to hold off on any chemotherapy and radiation due to his acute illness. Patient will follow-up with Dr. Grayland Ormond is an outpatient in a week to two weeks.  #7  hypertension-hemodynamically stable. -Continue triamterene/HCTZ, metoprolol  #8 neuropathy-continue Neurontin.  #9 depression/suicidal ideations-upon presentation to the hospital patient had some suicidal  thoughts about hurting himself and someone else. He was placed on suicide precautions and a psychiatry consult was obtained. -Patient was seen by psychiatry the next day and they did not think that the patient needed acute inpatient psychiatric care. Suicide precautions were lifted and patient was started on some low-dose Remeron which she is being discharged on presently.  Patient is being discharged home with home health physical therapy/nursing services.   DISCHARGE CONDITIONS:   Stable  CONSULTS OBTAINED:  Treatment Team:  Dustin Flock, MD Lloyd Huger, MD Donita Brooks, MD  DRUG ALLERGIES:  No Known Allergies  DISCHARGE MEDICATIONS:   Current Discharge Medication List    START taking these medications   Details  !! apixaban (ELIQUIS) 5 MG TABS tablet Take 2 tablets (10 mg total) by mouth 2 (two) times daily. Qty: 40 tablet, Refills: 0    !! apixaban (ELIQUIS) 5 MG TABS tablet Take 1 tablet (5 mg total) by mouth 2 (two) times daily. Qty: 60 tablet, Refills: 1    levofloxacin (LEVAQUIN) 750 MG tablet Take 1 tablet (750 mg total) by mouth daily. Qty: 7 tablet, Refills: 0    mirtazapine (REMERON SOL-TAB) 15 MG disintegrating tablet Take 1 tablet (15 mg total) by mouth at bedtime. Qty: 30 tablet, Refills: 1    predniSONE (DELTASONE) 10 MG tablet Label  & dispense according to the schedule below. 5 Pills PO for 1 day then, 4 Pills PO for 1 day, 3 Pills PO for 1 day, 2 Pills PO for 1 day1, 1 Pill PO for 1 days then STOP. Qty: 15 tablet, Refills: 0     !! - Potential duplicate medications found. Please discuss with provider.    CONTINUE these medications which have NOT CHANGED   Details  amiodarone (PACERONE) 200 MG tablet Take 1 tablet (200 mg total) by mouth 2 (two) times daily as needed. Qty: 60 tablet, Refills: 3    gabapentin (NEURONTIN) 100 MG capsule Take 300 mg by mouth 3 (three) times daily.     HYDROcodone-acetaminophen (NORCO/VICODIN) 5-325 MG per  tablet TK 1 T PO Q 6 H PRN FOR MODERATE PAIN. Refills: 0    loratadine (CLARITIN) 10 MG tablet Take 10 mg by mouth 2 (two) times daily.    metoprolol tartrate (LOPRESSOR) 25 MG tablet Take 1 tablet (25 mg total) by mouth 2 (two) times daily. Qty: 60 tablet, Refills: 12    nitroGLYCERIN (NITROSTAT) 0.4 MG SL tablet Place 1 tablet (0.4 mg total) under the tongue every 5 (five) minutes as needed. Qty: 25 tablet, Refills: 12    prochlorperazine (COMPAZINE) 10 MG tablet Take 1 tablet (10 mg total) by mouth every 6 (six) hours as needed for nausea or vomiting. Qty: 120 tablet, Refills: 1      STOP taking these medications     aspirin 162 MG EC tablet      sucralfate (CARAFATE) 1 G tablet      triamterene-hydrochlorothiazide (DYAZIDE) 37.5-25 MG per capsule          DISCHARGE INSTRUCTIONS:   DIET:  Cardiac diet  DISCHARGE CONDITION:  Stable  ACTIVITY:  Activity as tolerated  OXYGEN:  Home Oxygen: No.   Oxygen Delivery: room air  DISCHARGE LOCATION:  Home with home health nursing and physical therapy.   If you experience worsening of your admission symptoms, develop shortness of breath, life  threatening emergency, suicidal or homicidal thoughts you must seek medical attention immediately by calling 911 or calling your MD immediately  if symptoms less severe.  You Must read complete instructions/literature along with all the possible adverse reactions/side effects for all the Medicines you take and that have been prescribed to you. Take any new Medicines after you have completely understood and accpet all the possible adverse reactions/side effects.   Please note  You were cared for by a hospitalist during your hospital stay. If you have any questions about your discharge medications or the care you received while you were in the hospital after you are discharged, you can call the unit and asked to speak with the hospitalist on call if the hospitalist that took care of you  is not available. Once you are discharged, your primary care physician will handle any further medical issues. Please note that NO REFILLS for any discharge medications will be authorized once you are discharged, as it is imperative that you return to your primary care physician (or establish a relationship with a primary care physician if you do not have one) for your aftercare needs so that they can reassess your need for medications and monitor your lab values.     Today   Still has a cough which is productive with yellow sputum. No fever. No chest pain.  VITAL SIGNS:  Blood pressure 96/58, pulse 71, temperature 99.4 F (37.4 C), temperature source Oral, resp. rate 16, height '5\' 8"'$  (1.727 m), weight 71.215 kg (157 lb), SpO2 96 %.  I/O:   Intake/Output Summary (Last 24 hours) at 04/24/15 1413 Last data filed at 04/23/15 2249  Gross per 24 hour  Intake    120 ml  Output    500 ml  Net   -380 ml    PHYSICAL EXAMINATION:  GENERAL:  74 y.o.-year-old patient lying in the bed with no acute distress.  EYES: Pupils equal, round, reactive to light and accommodation. No scleral icterus. Extraocular muscles intact.  HEENT: Head atraumatic, normocephalic. Oropharynx and nasopharynx clear.  NECK:  Supple, no jugular venous distention. No thyroid enlargement, no tenderness.  LUNGS: Minimal end expiratory wheezing. No rhonchi no rales. No dullness to percussion. No use of accessory muscles of respiration.  CARDIOVASCULAR: S1, S2 RRR. 2/6 systolic ejection murmur at the right sternal border, No rubs, or gallops.  ABDOMEN: Soft, non-tender, non-distended. Bowel sounds present. No organomegaly or mass.  EXTREMITIES: No pedal edema, cyanosis, or clubbing.  NEUROLOGIC: Cranial nerves II through XII are intact. No focal motor or sensory defecits b/l.  PSYCHIATRIC: The patient is alert and oriented x 3. Good affect.  SKIN: No obvious rash, lesion, or ulcer.   DATA REVIEW:   CBC  Recent Labs Lab  04/24/15 0414  WBC 6.8  HGB 8.0*  HCT 23.1*  PLT 130*    Chemistries   Recent Labs Lab 04/21/15 1109 04/22/15 0438  NA 133* 133*  K 3.8 3.7  CL 97* 103  CO2 25 24  GLUCOSE 132* 101*  BUN 19 14  CREATININE 0.91 0.77  CALCIUM 8.5* 8.0*  MG 2.0  --   AST 16  --   ALT 9*  --   ALKPHOS 80  --   BILITOT 1.2  --     Cardiac Enzymes  Recent Labs Lab 04/21/15 1109  TROPONINI <0.03    Microbiology Results  Results for orders placed or performed during the hospital encounter of 04/21/15  Blood culture (routine x 2)  Status: None (Preliminary result)   Collection Time: 04/21/15  1:07 PM  Result Value Ref Range Status   Specimen Description BLOOD LEFT ASSIST CONTROL  Final   Special Requests BOTTLES DRAWN AEROBIC AND ANAEROBIC 5 CC  Final   Culture NO GROWTH 3 DAYS  Final   Report Status PENDING  Incomplete  Blood culture (routine x 2)     Status: None (Preliminary result)   Collection Time: 04/21/15  1:07 PM  Result Value Ref Range Status   Specimen Description BLOOD RIGHT ARM  Final   Special Requests BOTTLES DRAWN AEROBIC AND ANAEROBIC 5 CC  Final   Culture NO GROWTH 3 DAYS  Final   Report Status PENDING  Incomplete    RADIOLOGY:  No results found.    Management plans discussed with the patient, family and they are in agreement.  CODE STATUS:     Code Status Orders        Start     Ordered   04/21/15 1657  Do not attempt resuscitation (DNR)   Continuous    Question Answer Comment  In the event of cardiac or respiratory ARREST Do not call a "code blue"   In the event of cardiac or respiratory ARREST Do not perform Intubation, CPR, defibrillation or ACLS   In the event of cardiac or respiratory ARREST Use medication by any route, position, wound care, and other measures to relive pain and suffering. May use oxygen, suction and manual treatment of airway obstruction as needed for comfort.      04/21/15 1656      TOTAL TIME TAKING CARE OF THIS  PATIENT: 40 minutes.    Henreitta Leber M.D on 04/24/2015 at 2:13 PM  Between 7am to 6pm - Pager - [phone]  After 6pm go to [email or web address] - password [email or web address] [facility]  Office  [phone]  CC: Primary care physician; Dicky Doe, MD

## 2015-04-25 ENCOUNTER — Ambulatory Visit: Payer: Commercial Managed Care - HMO

## 2015-04-25 ENCOUNTER — Telehealth: Payer: Self-pay | Admitting: Family Medicine

## 2015-04-25 ENCOUNTER — Encounter: Payer: Self-pay | Admitting: Emergency Medicine

## 2015-04-25 ENCOUNTER — Other Ambulatory Visit: Payer: Self-pay

## 2015-04-25 ENCOUNTER — Emergency Department
Admission: EM | Admit: 2015-04-25 | Discharge: 2015-04-25 | Disposition: A | Discharge status: Home / Self Care | Payer: Commercial Managed Care - HMO | Attending: Emergency Medicine | Admitting: Emergency Medicine

## 2015-04-25 ENCOUNTER — Emergency Department: Payer: Commercial Managed Care - HMO

## 2015-04-25 DIAGNOSIS — Z79899 Other long term (current) drug therapy: Secondary | ICD-10-CM | POA: Insufficient documentation

## 2015-04-25 DIAGNOSIS — R109 Unspecified abdominal pain: Secondary | ICD-10-CM | POA: Diagnosis not present

## 2015-04-25 DIAGNOSIS — I252 Old myocardial infarction: Secondary | ICD-10-CM | POA: Diagnosis not present

## 2015-04-25 DIAGNOSIS — Z87891 Personal history of nicotine dependence: Secondary | ICD-10-CM | POA: Diagnosis not present

## 2015-04-25 DIAGNOSIS — E871 Hypo-osmolality and hyponatremia: Secondary | ICD-10-CM | POA: Diagnosis not present

## 2015-04-25 DIAGNOSIS — I10 Essential (primary) hypertension: Secondary | ICD-10-CM | POA: Insufficient documentation

## 2015-04-25 DIAGNOSIS — R040 Epistaxis: Secondary | ICD-10-CM | POA: Insufficient documentation

## 2015-04-25 HISTORY — DX: Malignant (primary) neoplasm, unspecified: C80.1

## 2015-04-25 LAB — CBC WITH DIFFERENTIAL/PLATELET
BASOS PCT: 0 %
Basophils Absolute: 0 10*3/uL (ref 0–0.1)
EOS PCT: 0 %
Eosinophils Absolute: 0 10*3/uL (ref 0–0.7)
HCT: 24.3 % — ABNORMAL LOW (ref 40.0–52.0)
Hemoglobin: 8.3 g/dL — ABNORMAL LOW (ref 13.0–18.0)
Lymphocytes Relative: 2 %
Lymphs Abs: 0.2 10*3/uL — ABNORMAL LOW (ref 1.0–3.6)
MCH: 39.2 pg — AB (ref 26.0–34.0)
MCHC: 34.3 g/dL (ref 32.0–36.0)
MCV: 114.4 fL — ABNORMAL HIGH (ref 80.0–100.0)
Monocytes Absolute: 0.3 10*3/uL (ref 0.2–1.0)
Monocytes Relative: 3 %
Neutro Abs: 8.9 10*3/uL — ABNORMAL HIGH (ref 1.4–6.5)
Neutrophils Relative %: 95 %
PLATELETS: 134 10*3/uL — AB (ref 150–440)
RBC: 2.13 MIL/uL — ABNORMAL LOW (ref 4.40–5.90)
RDW: 16.2 % — ABNORMAL HIGH (ref 11.5–14.5)
WBC: 9.4 10*3/uL (ref 3.8–10.6)

## 2015-04-25 LAB — COMPREHENSIVE METABOLIC PANEL
ALT: 31 U/L (ref 17–63)
ANION GAP: 12 (ref 5–15)
AST: 36 U/L (ref 15–41)
Albumin: 2.7 g/dL — ABNORMAL LOW (ref 3.5–5.0)
Alkaline Phosphatase: 219 U/L — ABNORMAL HIGH (ref 38–126)
BUN: 19 mg/dL (ref 6–20)
CALCIUM: 8.2 mg/dL — AB (ref 8.9–10.3)
CO2: 26 mmol/L (ref 22–32)
Chloride: 88 mmol/L — ABNORMAL LOW (ref 101–111)
Creatinine, Ser: 1 mg/dL (ref 0.61–1.24)
GFR calc non Af Amer: >60 mL/min (ref >60)
GLUCOSE: 136 mg/dL — AB (ref 65–99)
Potassium: 3.3 mmol/L — ABNORMAL LOW (ref 3.5–5.1)
SODIUM: 126 mmol/L — AB (ref 135–145)
Total Bilirubin: 1.4 mg/dL — ABNORMAL HIGH (ref 0.3–1.2)
Total Protein: 6.9 g/dL (ref 6.5–8.1)

## 2015-04-25 MED ORDER — APIXABAN 5 MG PO TABS: 5.0000 mg | ORAL_TABLET | Freq: Two times a day (BID) | ORAL | Status: AC

## 2015-04-25 MED ORDER — IOHEXOL 240 MG/ML SOLN
25.0000 mL | Freq: Once | INTRAMUSCULAR | Status: AC | PRN
  Administered 2015-04-25: 25 mL via ORAL

## 2015-04-25 MED ORDER — IOHEXOL 300 MG/ML  SOLN
100.0000 mL | Freq: Once | INTRAMUSCULAR | Status: AC | PRN
  Administered 2015-04-25: 100 mL via INTRAVENOUS

## 2015-04-25 NOTE — Discharge Instructions (Signed)
Abdominal Pain Many things can cause belly (abdominal) pain. Most times, the belly pain is not dangerous. Many cases of belly pain can be watched and treated at home. HOME CARE   Do not take medicines that help you go poop (laxatives) unless told to by your doctor.  Only take medicine as told by your doctor.  Eat or drink as told by your doctor. Your doctor will tell you if you should be on a special diet. GET HELP IF:  You do not know what is causing your belly pain.  You have belly pain while you are sick to your stomach (nauseous) or have runny poop (diarrhea).  You have pain while you pee or poop.  Your belly pain wakes you up at night.  You have belly pain that gets worse or better when you eat.  You have belly pain that gets worse when you eat fatty foods.  You have a fever. GET HELP RIGHT AWAY IF:   The pain does not go away within 2 hours.  You keep throwing up (vomiting).  The pain changes and is only in the right or left part of the belly.  You have bloody or tarry looking poop. MAKE SURE YOU:   Understand these instructions.  Will watch your condition.  Will get help right away if you are not doing well or get worse. Document Released: 03/22/2008 Document Revised: 10/09/2013 Document Reviewed: 06/13/2013 [facility] Patient Information 2015 [place], [place]. This information is not intended to replace advice given to you by your health care provider. Make sure you discuss any questions you have with your health care provider.  Hyponatremia  Hyponatremia is when the salt (sodium) in your blood is low. When salt becomes low, your cells take in extra water and puff up (swell). The puffiness can happen in the whole body. It mostly affects the brain and is very serious.  HOME CARE  Only take medicine as told by your doctor.  Follow any diet instructions you were given. This includes limiting how much fluid you drink.  Keep all doctor visits for tests as  told.  Avoid alcohol and drugs. GET HELP RIGHT AWAY IF:  You start to twitch and shake (seize).  You pass out (faint).  You continue to have watery poop (diarrhea) or you throw up (vomit).  You feel sick to your stomach (nauseous).  You are tired (fatigued), have a headache, are confused, or feel weak.  Your problems that first brought you to the doctor come back.  You have trouble following your diet instructions. MAKE SURE YOU:   Understand these instructions.  Will watch your condition.  Will get help right away if you are not doing well or get worse. Document Released: 06/16/2011 Document Revised: 12/27/2011 Document Reviewed: 06/16/2011 [facility] Patient Information 2015 [place], [place]. This information is not intended to replace advice given to you by your health care provider. Make sure you discuss any questions you have with your health care provider.     As we have discussed please follow-up with your doctor on Monday for recheck of her sodium level. Today's sodium level is 126. Return to the emergency department for any worsening abdominal pain, fever, or any other symptom personally concerning to your self.

## 2015-04-25 NOTE — ED Notes (Signed)
Pt presents with nose bleed started this am at 0430. Pt was just discharged from [facility] yesterday was treated for PNE and Blood Clots. Pt is currently taking blood thinners. Pt with lung cancer and was supposed to have radiation tx today.

## 2015-04-25 NOTE — Telephone Encounter (Signed)
[facility] called regarding patient discharge prescriptions from the hospital. His insurance will not cover eliquis. The ER will not complete the prior auth needed for the medication. They are requesting that this office reorder the medication and complete the prior auth.

## 2015-04-25 NOTE — ED Provider Notes (Signed)
[facility] Emergency Department Provider Note  Time seen: 9:51 AM  I have reviewed the triage vital signs and the nursing notes.   HISTORY  Chief Complaint Epistaxis and Hypotension    HPI Jerry Davenport is a 74 y.o. male with a past medical history of MI, hypertension, CVA, atrial fibrillation, recently admitted for pneumonia and "blood clots" placed on Eliquis. Patient was discharged home yesterday. He states he developed a nosebleed with significant bleeding this morning so he came to the emergency department for evaluation. He also notes moderate left-sided abdominal pain for the past 2 weeks. He states he mentioned it several times while admitted to the hospital but never had it evaluated. States the pain continues to worsen, and he would also like this evaluated. Denies any fever, vomiting, diarrhea. The patient does state mild constipation as well as moderate nausea.The bleeding has resolved before the patient arrived to the emergency department.     Past Medical History  Diagnosis Date  . Hyposmolality and/or hyponatremia   . Other and unspecified hyperlipidemia   . Headache(784.0)   . Backache, unspecified   . Allergic rhinitis, cause unspecified   . Tobacco use disorder   . Other constipation   . Myocardial infarction   . Hypertension   . Shortness of breath     with exertion, pt. remarks that he "gives out more from his legs than his breathing"   . Stroke     [facility]- pt. told he had a stroke, but he doesn't think he did   . Peripheral vascular disease, unspecified   . GERD (gastroesophageal reflux disease)     indigestion on occasion   . Arthritis     back, legs, knees   . A-fib   . Kidney stones     right kidney  . Coronary artery disease   . Dysrhythmia     a fib  . 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 COPD (chronic obstructive pulmonary disease)     Patient Active Problem List   Diagnosis Date Noted  . Depressive  disorder 04/23/2015  . Pulmonary emboli 04/21/2015  . Lung cancer 03/24/2015  . Lung mass 02/21/2015  . Atrial flutter 12/10/2014  . Atrial fibrillation 08/30/2013  . Orthostatic hypotension 08/30/2013  . 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 Hematuria 08/07/2013  . CAD (coronary artery disease) 07/26/2013  . Chest pain 07/16/2013  . SOB (shortness of breath) 07/16/2013  . Smoker 07/16/2013  . PAD (peripheral artery disease) 07/16/2013  . Hyperlipidemia 07/16/2013  . Blood in the urine 08/17/2012  . Back ache 08/17/2012  . CAFL (chronic airflow limitation) 08/17/2012  . 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 Cerebral infarct 08/17/2012  . Hay fever 08/17/2012  . BP (high blood pressure) 08/17/2012  . Hypercholesteremia 08/17/2012  . Diagnostic skin and sensitization tests 08/17/2012  . Current tobacco use 08/17/2012    Past Surgical History  Procedure Laterality Date  . Hemorroidectomy    . Cardiac catheterization  2014    [facility]  . Knee surgery  1990's    [facility]- Left knee, cartilage removed   . Multiple tooth extractions  1980's  . Coronary artery bypass graft N/A 08/13/2013    Procedure: CORONARY ARTERY BYPASS GRAFTING (CABG);  Surgeon: Melrose Nakayama, MD;  Location: [place];  Service: Open Heart Surgery;  Laterality: N/A;  CABG x three,  using left internal mammary artery and right leg greater saphenous vein harvested endoscopically  Current Outpatient Rx  Name  Route  Sig  Dispense  Refill  . amiodarone (PACERONE) 200 MG tablet   Oral   Take 1 tablet (200 mg total) by mouth 2 (two) times daily as needed.   60 tablet   3   . apixaban (ELIQUIS) 5 MG TABS tablet   Oral   Take 2 tablets (10 mg total) by mouth 2 (two) times daily.   40 tablet   0   . apixaban (ELIQUIS) 5 MG TABS tablet   Oral   Take 1 tablet (5 mg total) by mouth 2 (two) times daily.   60 tablet   1   . gabapentin (NEURONTIN) 100 MG capsule   Oral   Take 300 mg by mouth 3 (three)  times daily.          Marland Kitchen HYDROcodone-acetaminophen (NORCO/VICODIN) 5-325 MG per tablet      TK 1 T PO Q 6 H PRN FOR MODERATE PAIN.      0   . levofloxacin (LEVAQUIN) 750 MG tablet   Oral   Take 1 tablet (750 mg total) by mouth daily.   7 tablet   0   . loratadine (CLARITIN) 10 MG tablet   Oral   Take 10 mg by mouth 2 (two) times daily.         . metoprolol tartrate (LOPRESSOR) 25 MG tablet   Oral   Take 1 tablet (25 mg total) by mouth 2 (two) times daily. Patient taking differently: Take 12.5 mg by mouth 2 (two) times daily.    60 tablet   12   . mirtazapine (REMERON SOL-TAB) 15 MG disintegrating tablet   Oral   Take 1 tablet (15 mg total) by mouth at bedtime.   30 tablet   1   . nitroGLYCERIN (NITROSTAT) 0.4 MG SL tablet   Sublingual   Place 1 tablet (0.4 mg total) under the tongue every 5 (five) minutes as needed.   25 tablet   12   . predniSONE (DELTASONE) 10 MG tablet      Label  & dispense according to the schedule below. 5 Pills PO for 1 day then, 4 Pills PO for 1 day, 3 Pills PO for 1 day, 2 Pills PO for 1 day1, 1 Pill PO for 1 days then STOP.   15 tablet   0   . prochlorperazine (COMPAZINE) 10 MG tablet   Oral   Take 1 tablet (10 mg total) by mouth every 6 (six) hours as needed for nausea or vomiting.   120 tablet   1     Allergies Review of patient's allergies indicates no known allergies.  Family History  Problem Relation Age of Onset  . Hypertension Mother     Social History History  Substance Use Topics  . Smoking status: Former Smoker -- 0.25 packs/day for 60 years    Types: Cigarettes    Quit date: 12/10/2013  . Smokeless tobacco: Never Used     Comment: down to 2-4 cigs a day now  . Alcohol Use: No     Comment: none for three months, reports he hasn't ever been a heavy drinker    Review of Systems Constitutional: Negative for fever. Cardiovascular: Some left-sided chest pain, which is likely due to pulmonary emboli (previously  diagnosed) unchanged. Respiratory: Negative for shortness of breath. Occasional cough. Gastrointestinal: Moderate left abdominal pain. Moderate nausea. Negative for vomiting or diarrhea. Musculoskeletal: Negative for back pain. 10-point ROS otherwise negative.  ____________________________________________   PHYSICAL EXAM:  VITAL SIGNS: ED Triage Vitals  Enc Vitals Group     BP 04/25/15 0927 89/58 mmHg     Pulse Rate 04/25/15 0927 80     Resp 04/25/15 0927 22     Temp 04/25/15 0927 98.1 F (36.7 C)     Temp Source 04/25/15 0927 Oral     SpO2 04/25/15 0927 100 %     Weight 04/25/15 0927 158 lb (71.668 kg)     Height 04/25/15 0927 '5\' 7"'$  (1.702 m)     Head Cir --      Peak Flow --      Pain Score 04/25/15 0927 7     Pain Loc --      Pain Edu? --      Excl. in [place]? --     Constitutional: Alert and oriented. Well appearing and in no distress. ENT   Mouth/Throat: Mucous membranes are moist. Dried blood in right nostril. Cardiovascular: Normal rate, regular rhythm. No murmur Respiratory: Normal respiratory effort without tachypnea nor retractions. Breath sounds are clear and equal bilaterally.  Gastrointestinal: Soft, mild tenderness palpation of the left abdomen, no rebound or guarding. No distention. Musculoskeletal: Nontender with normal range of motion in all extremities.  Neurologic:  Normal speech and language. No gross focal neurologic deficits  Skin:  Skin is warm, dry and intact.  Psychiatric: Mood and affect are normal. Speech and behavior are normal.  ____________________________________________    EKG  EKG reviewed and interpreted by myself shows normal sinus rhythm at 78 bpm, narrow QRS, normal axis, slightly prolonged QTC at 506 ms, nonspecific but no concerning ST changes noted.  ____________________________________________    RADIOLOGY  CT report shows left lower lobe consolidation, as well as left iliac crest lytic lesion was soft tissue  mass.  ____________________________________________   INITIAL IMPRESSION / ASSESSMENT AND PLAN / ED COURSE  Pertinent labs & imaging results that were available during my care of the patient were reviewed by me and considered in my medical decision making (see chart for details).  I have reviewed the patient's discharge summary. The patient was discharged yesterday after hospital admission for difficulty breathing likely due to pulmonary thrombus, along with metastatic lung cancer, and possibly pneumonia. The patient discharged on Eliquis and Levaquin. The patient was receiving heparin during his stay in the hospital which is likely causing increased risk for epistaxis per the patient. We will check labs, proceed with CT abdomen and pelvis to help further evaluate. We will closely monitor in the emergency department, currently there is no epistaxis.   Labs show a low sodium level of 126. It appears the patient's sodium level has been down trending since his admission, this is possibly dilutional, possibly dietary, possibly SIADH related from lung cancer, or combination of these. I discussed this with the patient, and he is to follow-up on Monday with his primary care doctor to have this rechecked. CT scan has shown left lower lobe consolidation which was known, as well as left iliac crest lytic lesion with soft tissue mass likely metastatic disease. Both of these could be contributing to his left-sided abdominal pain. Patient is to follow-up with his oncologist on Monday. Discussed return precautions to which they are agreeable. No further epistaxis since arrival to the emergency department.  ____________________________________________   FINAL CLINICAL IMPRESSION(S) / ED DIAGNOSES  Left-sided abdominal pain Epistaxis Hyponatremia   Harvest Dark, MD 04/25/15 1235

## 2015-04-25 NOTE — Telephone Encounter (Signed)
[facility] faxing the PA forms. [facility]

## 2015-04-26 LAB — CULTURE, BLOOD (ROUTINE X 2)
Culture: NO GROWTH
Culture: NO GROWTH

## 2015-04-28 ENCOUNTER — Inpatient Hospital Stay: Payer: Commercial Managed Care - HMO | Attending: Oncology | Admitting: Oncology

## 2015-04-28 ENCOUNTER — Ambulatory Visit: Payer: Self-pay | Admitting: Family Medicine

## 2015-04-28 ENCOUNTER — Ambulatory Visit: Payer: Commercial Managed Care - HMO

## 2015-04-28 ENCOUNTER — Ambulatory Visit
Admission: RE | Admit: 2015-04-28 | Discharge: 2015-04-28 | Disposition: A | Discharge status: Home / Self Care | Payer: Commercial Managed Care - HMO | Source: Ambulatory Visit | Attending: Radiation Oncology | Admitting: Radiation Oncology

## 2015-04-28 VITALS — BP 98/60 | HR 70 | Temp 95.4°F | Resp 20 | Ht 67.0 in | Wt 150.7 lb

## 2015-04-28 DIAGNOSIS — I2510 Atherosclerotic heart disease of native coronary artery without angina pectoris: Secondary | ICD-10-CM

## 2015-04-28 DIAGNOSIS — E785 Hyperlipidemia, unspecified: Secondary | ICD-10-CM | POA: Diagnosis not present

## 2015-04-28 DIAGNOSIS — I739 Peripheral vascular disease, unspecified: Secondary | ICD-10-CM | POA: Diagnosis not present

## 2015-04-28 DIAGNOSIS — Z87442 Personal history of urinary calculi: Secondary | ICD-10-CM | POA: Diagnosis not present

## 2015-04-28 DIAGNOSIS — R531 Weakness: Secondary | ICD-10-CM

## 2015-04-28 DIAGNOSIS — C3412 Malignant neoplasm of upper lobe, left bronchus or lung: Secondary | ICD-10-CM

## 2015-04-28 DIAGNOSIS — R05 Cough: Secondary | ICD-10-CM | POA: Diagnosis not present

## 2015-04-28 DIAGNOSIS — R5383 Other fatigue: Secondary | ICD-10-CM | POA: Diagnosis not present

## 2015-04-28 DIAGNOSIS — I10 Essential (primary) hypertension: Secondary | ICD-10-CM

## 2015-04-28 DIAGNOSIS — E871 Hypo-osmolality and hyponatremia: Secondary | ICD-10-CM | POA: Diagnosis not present

## 2015-04-28 DIAGNOSIS — Z79899 Other long term (current) drug therapy: Secondary | ICD-10-CM | POA: Diagnosis not present

## 2015-04-28 DIAGNOSIS — I4891 Unspecified atrial fibrillation: Secondary | ICD-10-CM | POA: Diagnosis not present

## 2015-04-28 DIAGNOSIS — Z8673 Personal history of transient ischemic attack (TIA), and cerebral infarction without residual deficits: Secondary | ICD-10-CM | POA: Diagnosis not present

## 2015-04-28 DIAGNOSIS — C7951 Secondary malignant neoplasm of bone: Secondary | ICD-10-CM | POA: Diagnosis not present

## 2015-04-28 DIAGNOSIS — Z510 Encounter for antineoplastic radiation therapy: Secondary | ICD-10-CM | POA: Diagnosis present

## 2015-04-28 DIAGNOSIS — R0602 Shortness of breath: Secondary | ICD-10-CM | POA: Diagnosis not present

## 2015-04-28 DIAGNOSIS — Z8701 Personal history of pneumonia (recurrent): Secondary | ICD-10-CM | POA: Diagnosis not present

## 2015-04-28 DIAGNOSIS — J449 Chronic obstructive pulmonary disease, unspecified: Secondary | ICD-10-CM

## 2015-04-28 DIAGNOSIS — M129 Arthropathy, unspecified: Secondary | ICD-10-CM

## 2015-04-28 DIAGNOSIS — I252 Old myocardial infarction: Secondary | ICD-10-CM | POA: Diagnosis not present

## 2015-04-28 DIAGNOSIS — F17210 Nicotine dependence, cigarettes, uncomplicated: Secondary | ICD-10-CM

## 2015-04-28 DIAGNOSIS — K219 Gastro-esophageal reflux disease without esophagitis: Secondary | ICD-10-CM | POA: Diagnosis not present

## 2015-04-28 DIAGNOSIS — Z86711 Personal history of pulmonary embolism: Secondary | ICD-10-CM | POA: Diagnosis not present

## 2015-04-28 DIAGNOSIS — C3402 Malignant neoplasm of left main bronchus: Secondary | ICD-10-CM | POA: Diagnosis not present

## 2015-04-28 NOTE — Progress Notes (Signed)
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 Patient also has a suspicious nodule of the left lower lobe.  INTERVAL HISTORY: Patient returns to clinic today for further evaluation and consideration of cycle 3 of concurrent carboplatinum and Taxol with XRT. He continues to have significant weakness and fatigue as well as a decreased performance status. He has a poor appetite. He continues to have shortness of breath and chronic cough. He denies any fevers. He has no neurologic complaints. He has no chest pain. He denies any nausea, vomiting, constipation, or diarrhea. He has no urinary complaints. Patient offers no further specific complaints.  REVIEW OF SYSTEMS:   Review of Systems  Constitutional: Positive for malaise/fatigue. Negative for fever and weight loss.  Respiratory: Positive for cough and shortness of breath. Negative for hemoptysis.   Cardiovascular: Negative.   Gastrointestinal: Negative.   Neurological: Positive for weakness.    As per HPI. Otherwise, a complete review of systems is negatve.  PAST MEDICAL HISTORY: Past Medical History  Diagnosis Date  . Hyposmolality and/or hyponatremia   . Other and unspecified hyperlipidemia   . Headache(784.0)   . Backache, unspecified   . Allergic rhinitis, cause unspecified   . Tobacco use disorder   . Other constipation   . Myocardial infarction   . Hypertension   . Shortness of breath     with exertion, pt. remarks that he "gives out more from his legs than his breathing"   . Stroke     [name]- pt. told he had a stroke, but he doesn't think he did   . Peripheral vascular  disease, unspecified   . GERD (gastroesophageal reflux disease)     indigestion on occasion   . Arthritis     back, legs, knees   . A-fib   . Kidney stones     right kidney  . Coronary artery disease   . Dysrhythmia     a fib  . 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 COPD (chronic obstructive pulmonary disease)   . Cancer     PAST SURGICAL HISTORY: Past Surgical History  Procedure Laterality Date  . Hemorroidectomy    . Cardiac catheterization  2014    [facility]  . Knee surgery  1990's    [facility]- Left knee, cartilage removed   . Multiple tooth extractions  1980's  . Coronary artery bypass graft N/A 08/13/2013    Procedure: CORONARY ARTERY BYPASS GRAFTING (CABG);  Surgeon: Melrose Nakayama, MD;  Location: [place];  Service: Open Heart Surgery;  Laterality: N/A;  CABG x three,  using left internal mammary artery and right leg greater saphenous vein harvested endoscopically    FAMILY HISTORY Family History  Problem Relation Age of Onset  . Hypertension Mother        ADVANCED DIRECTIVES:    HEALTH MAINTENANCE: History  Substance Use Topics  . Smoking status: Former Smoker -- 0.25 packs/day for 60 years    Types: Cigarettes    Quit date: 12/10/2013  . Smokeless tobacco: Never Used     Comment: down to 2-4 cigs a day now  . Alcohol Use: No     Comment: none  for three months, reports he hasn't ever been a heavy drinker     Colonoscopy:  PAP:  Bone density:  Lipid panel:  No Known Allergies  Current Outpatient Prescriptions  Medication Sig Dispense Refill  . amiodarone (PACERONE) 200 MG tablet Take 1 tablet (200 mg total) by mouth 2 (two) times daily as needed. 60 tablet 3  . gabapentin (NEURONTIN) 100 MG capsule Take 300 mg by mouth 3 (three) times daily.     Marland Kitchen HYDROcodone-acetaminophen (NORCO/VICODIN) 5-325 MG per tablet TK 1 T PO Q 6 H PRN FOR MODERATE PAIN.  0  . metoprolol tartrate (LOPRESSOR) 25 MG tablet Take 1 tablet (25 mg total) by mouth 2 (two)  times daily. (Patient taking differently: Take 12.5 mg by mouth 2 (two) times daily. ) 60 tablet 12  . nitroGLYCERIN (NITROSTAT) 0.4 MG SL tablet Place 1 tablet (0.4 mg total) under the tongue every 5 (five) minutes as needed. 25 tablet 12  . prochlorperazine (COMPAZINE) 10 MG tablet Take 1 tablet (10 mg total) by mouth every 6 (six) hours as needed for nausea or vomiting. 120 tablet 1  . [START ON 04/30/2015] apixaban (ELIQUIS) 5 MG TABS tablet Take 1 tablet (5 mg total) by mouth 2 (two) times daily. 60 tablet 1  . aspirin EC 81 MG tablet Take 81 mg by mouth daily.    Marland Kitchen levofloxacin (LEVAQUIN) 750 MG tablet Take 1 tablet (750 mg total) by mouth daily. 7 tablet 0  . predniSONE (DELTASONE) 10 MG tablet Label  & dispense according to the schedule below. 5 Pills PO for 1 day then, 4 Pills PO for 1 day, 3 Pills PO for 1 day, 2 Pills PO for 1 day1, 1 Pill PO for 1 days then STOP. 15 tablet 0  . senna (SENOKOT) 8.6 MG tablet Take 1 tablet by mouth daily.     No current facility-administered medications for this visit.    OBJECTIVE: Filed Vitals:   04/17/15 1009  BP: 114/67  Pulse: 64  Temp: 97.5 F (36.4 C)  Resp: 18     Body mass index is 24.12 kg/(m^2).    ECOG FS:2 - Symptomatic, <50% confined to bed  General: Well-developed, well-nourished, no acute distress. Eyes: anicteric sclera. Lungs: Clear to auscultation bilaterally. Heart: Regular rate and rhythm. No rubs, murmurs, or gallops. Abdomen: Soft, nontender, nondistended. No organomegaly noted, normoactive bowel sounds. Musculoskeletal: No edema, cyanosis, or clubbing. Neuro: Alert, answering all questions appropriately. Cranial nerves grossly intact. Skin: No rashes or petechiae noted. Psych: Normal affect.   LAB RESULTS:  Lab Results  Component Value Date   NA 126* 04/25/2015   K 3.3* 04/25/2015   CL 88* 04/25/2015   CO2 26 04/25/2015   GLUCOSE 136* 04/25/2015   BUN 19 04/25/2015   CREATININE 1.00 04/25/2015   CALCIUM  8.2* 04/25/2015   PROT 6.9 04/25/2015   ALBUMIN 2.7* 04/25/2015   AST 36 04/25/2015   ALT 31 04/25/2015   ALKPHOS 219* 04/25/2015   BILITOT 1.4* 04/25/2015   GFRNONAA >60 04/25/2015   GFRAA >60 04/25/2015    Lab Results  Component Value Date   WBC 9.4 04/25/2015   NEUTROABS 8.9* 04/25/2015   HGB 8.3* 04/25/2015   HCT 24.3* 04/25/2015   MCV 114.4* 04/25/2015   PLT 134* 04/25/2015     STUDIES: PET scan completed January 22, 2015.  1. Intensely hypermetabolic left suprahilar mass within the central left upper lobe is most consists with bronchogenic carcinoma. 2. Moderately hypermetabolic nodular consolidation  in the left lower lobe is more dense than 03/04/2014. Favor a second primary low-grade adenocarcinoma for this lesion. 3. Single inflamed diverticulum of the sigmoid colon.   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 Patient also has a suspicious nodule of the left lower lobe.  PLAN:    1. Lung cancer: Patient is not a surgical candidate. Proceed with cycle 3 of weekly carboplatinum and Taxol today. Previously, Taxol has been dose reduced. Continue daily XRT.  Given patient's declining performance status, we will unlikely use consolidation chemotherapy at the end of his treatments. Patient does not wish port placement at this time, but will consider one in the future. Return to clinic in approximately in 1 week for consideration of cycle 4.  2. Decreased performance status:  Will receive 1 L of IV fluids today along with treatement.  Patient expressed understanding and was in agreement with this plan. He also understands that He can call clinic at any time with any questions, concerns, or complaints.   No matching staging information was found for the patient.  Lloyd Huger, MD   04/28/2015 8:29 AM

## 2015-04-29 ENCOUNTER — Ambulatory Visit: Payer: Commercial Managed Care - HMO

## 2015-04-29 ENCOUNTER — Ambulatory Visit
Admission: RE | Admit: 2015-04-29 | Discharge: 2015-04-29 | Disposition: A | Discharge status: Home / Self Care | Payer: Commercial Managed Care - HMO | Source: Ambulatory Visit | Attending: Radiation Oncology | Admitting: Radiation Oncology

## 2015-04-29 ENCOUNTER — Telehealth: Payer: Self-pay | Admitting: *Deleted

## 2015-04-29 DIAGNOSIS — Z510 Encounter for antineoplastic radiation therapy: Secondary | ICD-10-CM | POA: Diagnosis not present

## 2015-04-29 NOTE — Progress Notes (Signed)
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   INTERVAL HISTORY: Patient returns to clinic today for further evaluation and hospital follow-up. Reimaging in the hospital revealed widely metastatic disease including bony lesions in his pelvis which is significantly painful. He continues to have his cough from his postobstructive pneumonia.  He continues to have significant weakness and fatigue, but states this is improved after missing chemotherapy one week ago. He has a poor appetite. He denies any fevers. He has no neurologic complaints. He has no chest pain. He denies any nausea, vomiting, constipation, or diarrhea. He has no urinary complaints. Patient offers no further specific complaints.  REVIEW OF SYSTEMS:   Review of Systems  Constitutional: Positive for malaise/fatigue. Negative for fever and weight loss.  Respiratory: Positive for cough and shortness of breath. Negative for hemoptysis.   Cardiovascular: Negative.   Gastrointestinal: Negative.   Neurological: Positive for weakness.    As per HPI. Otherwise, a complete review of systems is negatve.  PAST MEDICAL HISTORY: Past Medical History  Diagnosis Date  . Hyposmolality and/or hyponatremia   . Other and unspecified hyperlipidemia   . Headache(784.0)   . Backache, unspecified   . Allergic rhinitis, cause unspecified   . Tobacco use disorder   . Other constipation   . Myocardial infarction   . Hypertension   . Shortness of breath     with exertion, pt. remarks that he "gives out more from his legs than his breathing"   . Stroke     [facility]- pt. told he had a stroke, but  he doesn't think he did   . Peripheral vascular disease, unspecified   . GERD (gastroesophageal reflux disease)     indigestion on occasion   . Arthritis     back, legs, knees   . A-fib   . Kidney stones     right kidney  . Coronary artery disease   . Dysrhythmia     a fib  . 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 COPD (chronic obstructive pulmonary disease)   . Cancer     PAST SURGICAL HISTORY: Past Surgical History  Procedure Laterality Date  . Hemorroidectomy    . Cardiac catheterization  2014    [facility]  . Knee surgery  1990's    [facility]- Left knee, cartilage removed   . Multiple tooth extractions  1980's  . Coronary artery bypass graft N/A 08/13/2013    Procedure: CORONARY ARTERY BYPASS GRAFTING (CABG);  Surgeon: Melrose Nakayama, MD;  Location: [place];  Service: Open Heart Surgery;  Laterality: N/A;  CABG x three,  using left internal mammary artery and right leg greater saphenous vein harvested endoscopically    FAMILY HISTORY Family History  Problem Relation Age of Onset  . Hypertension Mother        ADVANCED DIRECTIVES:    HEALTH MAINTENANCE: History  Substance Use Topics  . Smoking status: Former Smoker -- 0.25 packs/day for 60 years    Types: Cigarettes    Quit date: 12/10/2013  . Smokeless tobacco: Never Used     Comment: down to 2-4 cigs a day now  . Alcohol Use: No  Comment: none for three months, reports he hasn't ever been a heavy drinker     Colonoscopy:  PAP:  Bone density:  Lipid panel:  No Known Allergies  Current Outpatient Prescriptions  Medication Sig Dispense Refill  . aspirin EC 81 MG tablet Take 81 mg by mouth daily.    Marland Kitchen gabapentin (NEURONTIN) 100 MG capsule Take 300 mg by mouth 3 (three) times daily.     Marland Kitchen HYDROcodone-acetaminophen (NORCO/VICODIN) 5-325 MG per tablet TK 1 T PO Q 6 H PRN FOR MODERATE PAIN.  0  . levofloxacin (LEVAQUIN) 750 MG tablet Take 1 tablet (750 mg total) by mouth daily. 7 tablet 0  .  metoprolol tartrate (LOPRESSOR) 25 MG tablet Take 1 tablet (25 mg total) by mouth 2 (two) times daily. (Patient taking differently: Take 12.5 mg by mouth 2 (two) times daily. ) 60 tablet 12  . amiodarone (PACERONE) 200 MG tablet Take 1 tablet (200 mg total) by mouth 2 (two) times daily as needed. 60 tablet 3  . [START ON 04/30/2015] apixaban (ELIQUIS) 5 MG TABS tablet Take 1 tablet (5 mg total) by mouth 2 (two) times daily. (Patient not taking: Reported on 04/28/2015) 60 tablet 1  . nitroGLYCERIN (NITROSTAT) 0.4 MG SL tablet Place 1 tablet (0.4 mg total) under the tongue every 5 (five) minutes as needed. (Patient not taking: Reported on 04/28/2015) 25 tablet 12  . predniSONE (DELTASONE) 10 MG tablet Label  & dispense according to the schedule below. 5 Pills PO for 1 day then, 4 Pills PO for 1 day, 3 Pills PO for 1 day, 2 Pills PO for 1 day1, 1 Pill PO for 1 days then STOP. (Patient not taking: Reported on 04/28/2015) 15 tablet 0  . prochlorperazine (COMPAZINE) 10 MG tablet Take 1 tablet (10 mg total) by mouth every 6 (six) hours as needed for nausea or vomiting. (Patient not taking: Reported on 04/28/2015) 120 tablet 1  . senna (SENOKOT) 8.6 MG tablet Take 1 tablet by mouth daily.     No current facility-administered medications for this visit.    OBJECTIVE: Filed Vitals:   04/28/15 1035  BP: 98/60  Pulse:   Temp:   Resp:      Body mass index is 23.59 kg/(m^2).    ECOG FS:2 - Symptomatic, <50% confined to bed  General: Well-developed, well-nourished, no acute distress. Eyes: anicteric sclera. Lungs: Clear to auscultation bilaterally. Heart: Regular rate and rhythm. No rubs, murmurs, or gallops. Abdomen: Soft, nontender, nondistended. No organomegaly noted, normoactive bowel sounds. Musculoskeletal: No edema, cyanosis, or clubbing. Neuro: Alert, answering all questions appropriately. Cranial nerves grossly intact. Skin: No rashes or petechiae noted. Psych: Normal affect.   LAB  RESULTS:  Lab Results  Component Value Date   NA 126* 04/25/2015   K 3.3* 04/25/2015   CL 88* 04/25/2015   CO2 26 04/25/2015   GLUCOSE 136* 04/25/2015   BUN 19 04/25/2015   CREATININE 1.00 04/25/2015   CALCIUM 8.2* 04/25/2015   PROT 6.9 04/25/2015   ALBUMIN 2.7* 04/25/2015   AST 36 04/25/2015   ALT 31 04/25/2015   ALKPHOS 219* 04/25/2015   BILITOT 1.4* 04/25/2015   GFRNONAA >60 04/25/2015   GFRAA >60 04/25/2015    Lab Results  Component Value Date   WBC 9.4 04/25/2015   NEUTROABS 8.9* 04/25/2015   HGB 8.3* 04/25/2015   HCT 24.3* 04/25/2015   MCV 114.4* 04/25/2015   PLT 134* 04/25/2015     STUDIES: CT scan April 25, 2015.  Extensive  left lower lobe airspace consolidation. Small left effusion. Underlying emphysematous change with interstitial fibrotic change in the bases. Nodular lesions in the right base suspicious for metastatic foci.  Lytic bone lesion involving the left iliac crest with soft tissue mass component measuring 4.1 x 3.5 cm. This finding is new compared to the prior study. This lesion is consistent with a metastatic focus from the known lung carcinoma.  Extensive atherosclerotic change in the aorta with areas of peripheral thrombus. 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 There is extensive atherosclerotic change in the major pelvic arterial vessels as well.  Multiple sigmoid diverticula without diverticulitis. No bowel obstruction. No abscess. Appendix appears normal.  No neoplastic focus is appreciable in the abdomen or pelvis.  Stable left adrenal hypertrophy.   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 PLAN:    1. Lung cancer: CT scan reviewed independently and reported as above with newly found metastatic lesion in patient's bony pelvis. He has initiated XRT for pain control. Return to clinic in 2 days for continuation of concurrent  chemotherapy with cycle 4 of carboplatinum and Taxol and XRT for his lung lesion. Patient will also now received Zometa every 4 weeks. Hospice was discussed, but patient would like to further discuss with his family. Patient does not wish port placement at this time, but will consider one in the future.  2. Decreased performance status:  Improved. 3. Pain: XRT as above. Continue oxycodone as needed. 4. Postobstructive pneumonia: Continue anti-biotics as prescribed. 5. Pulmonary embolus: Patient is currently on Eliquis.  Approximately 30 minutes was spent in discussion and consultation.  Patient expressed understanding and was in agreement with this plan. He also understands that He can call clinic at any time with any questions, concerns, or complaints.     Lloyd Huger, MD   04/29/2015 5:55 PM

## 2015-04-29 NOTE — Telephone Encounter (Signed)
Did we get the PA form for this patient?

## 2015-04-29 NOTE — Telephone Encounter (Signed)
Cancel [facility] referral, Will sign orders for [facility] and sign orders for their recommendations

## 2015-04-29 NOTE — Telephone Encounter (Signed)
I will call Insurance and see whats going on. [facility]

## 2015-04-30 ENCOUNTER — Inpatient Hospital Stay: Payer: Commercial Managed Care - HMO

## 2015-04-30 ENCOUNTER — Ambulatory Visit: Admission: RE | Admit: 2015-04-30 | Payer: Commercial Managed Care - HMO | Source: Ambulatory Visit

## 2015-04-30 ENCOUNTER — Ambulatory Visit
Admission: RE | Admit: 2015-04-30 | Discharge: 2015-04-30 | Disposition: A | Discharge status: Home / Self Care | Payer: Commercial Managed Care - HMO | Source: Ambulatory Visit | Attending: Radiation Oncology | Admitting: Radiation Oncology

## 2015-04-30 ENCOUNTER — Emergency Department
Admission: EM | Admit: 2015-04-30 | Discharge: 2015-04-30 | Disposition: A | Discharge status: Home / Self Care | Payer: Commercial Managed Care - HMO | Attending: Emergency Medicine | Admitting: Emergency Medicine

## 2015-04-30 ENCOUNTER — Encounter: Payer: Self-pay | Admitting: Emergency Medicine

## 2015-04-30 ENCOUNTER — Inpatient Hospital Stay: Payer: Commercial Managed Care - HMO | Admitting: Oncology

## 2015-04-30 ENCOUNTER — Ambulatory Visit: Payer: Commercial Managed Care - HMO

## 2015-04-30 ENCOUNTER — Emergency Department: Payer: Commercial Managed Care - HMO

## 2015-04-30 DIAGNOSIS — Z7982 Long term (current) use of aspirin: Secondary | ICD-10-CM | POA: Insufficient documentation

## 2015-04-30 DIAGNOSIS — I2510 Atherosclerotic heart disease of native coronary artery without angina pectoris: Secondary | ICD-10-CM | POA: Insufficient documentation

## 2015-04-30 DIAGNOSIS — Z87891 Personal history of nicotine dependence: Secondary | ICD-10-CM | POA: Insufficient documentation

## 2015-04-30 DIAGNOSIS — Z79899 Other long term (current) drug therapy: Secondary | ICD-10-CM | POA: Insufficient documentation

## 2015-04-30 DIAGNOSIS — R5381 Other malaise: Secondary | ICD-10-CM

## 2015-04-30 DIAGNOSIS — M6250 Muscle wasting and atrophy, not elsewhere classified, unspecified site: Secondary | ICD-10-CM | POA: Insufficient documentation

## 2015-04-30 DIAGNOSIS — I252 Old myocardial infarction: Secondary | ICD-10-CM | POA: Insufficient documentation

## 2015-04-30 DIAGNOSIS — R0600 Dyspnea, unspecified: Secondary | ICD-10-CM | POA: Diagnosis not present

## 2015-04-30 DIAGNOSIS — R531 Weakness: Secondary | ICD-10-CM | POA: Diagnosis present

## 2015-04-30 DIAGNOSIS — C3490 Malignant neoplasm of unspecified part of unspecified bronchus or lung: Secondary | ICD-10-CM

## 2015-04-30 DIAGNOSIS — I10 Essential (primary) hypertension: Secondary | ICD-10-CM | POA: Diagnosis not present

## 2015-04-30 LAB — CBC
HCT: 27.3 % — ABNORMAL LOW (ref 40.0–52.0)
HEMOGLOBIN: 9 g/dL — AB (ref 13.0–18.0)
MCH: 38.7 pg — AB (ref 26.0–34.0)
MCHC: 33.1 g/dL (ref 32.0–36.0)
MCV: 117 fL — AB (ref 80.0–100.0)
PLATELETS: 161 10*3/uL (ref 150–440)
RBC: 2.33 MIL/uL — AB (ref 4.40–5.90)
RDW: 16.3 % — AB (ref 11.5–14.5)
WBC: 7.8 10*3/uL (ref 3.8–10.6)

## 2015-04-30 LAB — COMPREHENSIVE METABOLIC PANEL
ALBUMIN: 2.8 g/dL — AB (ref 3.5–5.0)
ALK PHOS: 159 U/L — AB (ref 38–126)
ALT: 50 U/L (ref 17–63)
ANION GAP: 8 (ref 5–15)
AST: 25 U/L (ref 15–41)
BUN: 24 mg/dL — ABNORMAL HIGH (ref 6–20)
CO2: 29 mmol/L (ref 22–32)
Calcium: 8.3 mg/dL — ABNORMAL LOW (ref 8.9–10.3)
Chloride: 96 mmol/L — ABNORMAL LOW (ref 101–111)
Creatinine, Ser: 1.07 mg/dL (ref 0.61–1.24)
GLUCOSE: 100 mg/dL — AB (ref 65–99)
Potassium: 4.4 mmol/L (ref 3.5–5.1)
Sodium: 133 mmol/L — ABNORMAL LOW (ref 135–145)
Total Bilirubin: 0.7 mg/dL (ref 0.3–1.2)
Total Protein: 6.5 g/dL (ref 6.5–8.1)

## 2015-04-30 LAB — TROPONIN I: Troponin I: <0.03 ng/mL (ref <0.031)

## 2015-04-30 MED ORDER — IPRATROPIUM-ALBUTEROL 0.5-2.5 (3) MG/3ML IN SOLN
3.0000 mL | Freq: Once | RESPIRATORY_TRACT | Status: AC
  Administered 2015-04-30: 3 mL via RESPIRATORY_TRACT
  Filled 2015-04-30: qty 3

## 2015-04-30 MED ORDER — SODIUM CHLORIDE 0.9 % IV BOLUS (SEPSIS)
500.0000 mL | Freq: Once | INTRAVENOUS | Status: AC
  Administered 2015-04-30: 500 mL via INTRAVENOUS

## 2015-04-30 MED ORDER — HYDROCODONE-ACETAMINOPHEN 10-325 MG PO TABS: 1.0000 | ORAL_TABLET | Freq: Three times a day (TID) | ORAL | Status: DC | PRN

## 2015-04-30 MED ORDER — MORPHINE SULFATE 4 MG/ML IJ SOLN
4.0000 mg | Freq: Once | INTRAMUSCULAR | Status: AC
  Administered 2015-04-30: 4 mg via INTRAVENOUS
  Filled 2015-04-30: qty 1

## 2015-04-30 NOTE — ED Provider Notes (Signed)
[facility] Emergency Department Provider Note  ____________________________________________  Time seen: Approximately 9:05 AM  I have reviewed the triage vital signs and the nursing notes.   HISTORY  Chief Complaint Weakness and Hip Pain    HPI Jerry Davenport is a 74 y.o. male has a history of blood clots, cancer, presently on chemotherapy and radiation therapy, previous MI.  Presents today feeling fatigued and weak. He notably states that he isn't having severe pain in his left hip whenever he tries to walk and this is been increasing since about October due to her known cancer. In addition, he does feel very winded whenever he gets up and tries to move. He feels weak. He has not been eating well. Sister who is with him states that he was unable to be brought to the car today because of pain in his left hip and feeling weakness with standing.  Denies any chest pain. He does feel mildly short of breath/70 tries to exert himself. No fevers or chills. No cough except for a slight dry cough which is quite chronic.  Past Medical History  Diagnosis Date  . Hyposmolality and/or hyponatremia   . Other and unspecified hyperlipidemia   . Headache(784.0)   . Backache, unspecified   . Allergic rhinitis, cause unspecified   . Tobacco use disorder   . Other constipation   . Myocardial infarction   . Hypertension   . Shortness of breath     with exertion, pt. remarks that he "gives out more from his legs than his breathing"   . Stroke     [facility]- pt. told he had a stroke, but he doesn't think he did   . Peripheral vascular disease, unspecified   . GERD (gastroesophageal reflux disease)     indigestion on occasion   . Arthritis     back, legs, knees   . A-fib   . Kidney stones     right kidney  . Coronary artery disease   . Dysrhythmia     a fib  . 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 COPD (chronic obstructive pulmonary disease)   . Cancer      Patient Active Problem List   Diagnosis Date Noted  . Depressive disorder 04/23/2015  . Pulmonary emboli 04/21/2015  . Lung cancer 03/24/2015  . Lung mass 02/21/2015  . Atrial flutter 12/10/2014  . Atrial fibrillation 08/30/2013  . Orthostatic hypotension 08/30/2013  . 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 Hematuria 08/07/2013  . CAD (coronary artery disease) 07/26/2013  . Chest pain 07/16/2013  . SOB (shortness of breath) 07/16/2013  . Smoker 07/16/2013  . PAD (peripheral artery disease) 07/16/2013  . Hyperlipidemia 07/16/2013  . Blood in the urine 08/17/2012  . Back ache 08/17/2012  . CAFL (chronic airflow limitation) 08/17/2012  . 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 Cerebral infarct 08/17/2012  . Hay fever 08/17/2012  . BP (high blood pressure) 08/17/2012  . Hypercholesteremia 08/17/2012  . Diagnostic skin and sensitization tests 08/17/2012  . Current tobacco use 08/17/2012    Past Surgical History  Procedure Laterality Date  . Hemorroidectomy    . Cardiac catheterization  2014    [facility]  . Knee surgery  1990's    [facility]- Left knee, cartilage removed   . Multiple tooth extractions  1980's  . Coronary artery bypass graft N/A 08/13/2013    Procedure: CORONARY ARTERY BYPASS GRAFTING (CABG);  Surgeon: Melrose Nakayama, MD;  Location:  [place] [facility];  Service: Open Heart Surgery;  Laterality: N/A;  CABG x three,  using left internal mammary artery and right leg greater saphenous vein harvested endoscopically    Current Outpatient Rx  Name  Route  Sig  Dispense  Refill  . amiodarone (PACERONE) 200 MG tablet   Oral   Take 1 tablet (200 mg total) by mouth 2 (two) times daily as needed.   60 tablet   3   . apixaban (ELIQUIS) 5 MG TABS tablet   Oral   Take 1 tablet (5 mg total) by mouth 2 (two) times daily.   60 tablet   1   . aspirin EC 81 MG tablet   Oral   Take 81 mg by mouth daily.         Marland Kitchen gabapentin (NEURONTIN) 100 MG capsule   Oral   Take 300 mg  by mouth 3 (three) times daily.          Marland Kitchen levofloxacin (LEVAQUIN) 750 MG tablet   Oral   Take 1 tablet (750 mg total) by mouth daily.   7 tablet   0   . magnesium citrate SOLN   Oral   Take 1 Bottle by mouth once.         . mirtazapine (REMERON SOL-TAB) 15 MG disintegrating tablet   Oral   Take 1 tablet by mouth at bedtime.      1   . prochlorperazine (COMPAZINE) 10 MG tablet   Oral   Take 1 tablet (10 mg total) by mouth every 6 (six) hours as needed for nausea or vomiting.   120 tablet   1   . senna (SENOKOT) 8.6 MG tablet   Oral   Take 1 tablet by mouth daily.           Allergies Review of patient's allergies indicates no known allergies.  Family History  Problem Relation Age of Onset  . Hypertension Mother     Social History History  Substance Use Topics  . Smoking status: Former Smoker -- 0.25 packs/day for 60 years    Types: Cigarettes    Quit date: 12/10/2013  . Smokeless tobacco: Never Used     Comment: down to 2-4 cigs a day now  . Alcohol Use: No     Comment: none for three months, reports he hasn't ever been a heavy drinker    Review of Systems Constitutional: No fever/chills Eyes: No visual changes. ENT: No sore throat. Cardiovascular: Denies chest pain. Respiratory: See history of present illness Gastrointestinal: No abdominal pain.  No nausea, no vomiting.  No diarrhea.  No constipation. Genitourinary: Negative for dysuria. Musculoskeletal: Negative for back pain. Skin: Negative for rash. Neurological: Negative for headaches, focal weakness or numbness.  10-point ROS otherwise negative.  ____________________________________________   PHYSICAL EXAM:  VITAL SIGNS: ED Triage Vitals  Enc Vitals Group     BP --      Pulse --      Resp --      Temp --      Temp src --      SpO2 --      Weight --      Height --      Head Cir --      Peak Flow --      Pain Score 04/30/15 0852 4     Pain Loc --      Pain Edu? --       Excl. in [place]? --  Constitutional: Alert and oriented. Chronically ill-appearing and fatigued. He is in no acute distress. Eyes: Conjunctivae are normal. PERRL. EOMI. Head: Atraumatic. Nose: No congestion/rhinnorhea. Mouth/Throat: Mucous membranes are moist.  Oropharynx non-erythematous. Neck: No stridor.   Cardiovascular: Normal rate, regular rhythm. Grossly normal heart sounds.  Good peripheral circulation. Respiratory: Normal respiratory effort.  No retractions. Slight increased and expiratory phase, but otherwise clear lungs. Of note when I take the patient off oxygen, and we have a conversation regarding fishing for red snapper his oxygen level did drop as low as 87%. This improves on 2 L nasal cannula. Gastrointestinal: Soft and nontender. No distention. No abdominal bruits. No CVA tenderness. Musculoskeletal: No lower extremity tenderness nor edema.  There is pain elicited in the left hip joint with range of motion. He is unable to bear weight because of pain in the left hip with attempts to move. There is no deformity or shortening. The hip joint itself does not exhibit any erythema though it is slightly tender over the left hip. No lumbar tenderness. Neurologic:  Normal speech and language. No gross focal neurologic deficits are appreciated. No facial droop. Extraocular movements intact.  Skin:  Skin is warm, dry and intact. No rash noted. Psychiatric: Mood and affect are normal. Speech and behavior are normal.  ____________________________________________   LABS (all labs ordered are listed, but only abnormal results are displayed)  Labs Reviewed  CBC - Abnormal; Notable for the following:    RBC 2.33 (*)    Hemoglobin 9.0 (*)    HCT 27.3 (*)    MCV 117.0 (*)    MCH 38.7 (*)    RDW 16.3 (*)    All other components within normal limits  COMPREHENSIVE METABOLIC PANEL - Abnormal; Notable for the following:    Sodium 133 (*)    Chloride 96 (*)    Glucose, Bld 100 (*)    BUN  24 (*)    Calcium 8.3 (*)    Albumin 2.8 (*)    Alkaline Phosphatase 159 (*)    All other components within normal limits  TROPONIN I  URINALYSIS COMPLETEWITH MICROSCOPIC ([facility] ONLY)   ____________________________________________  EKG  Reviewed injury by me Normal sinus rhythm Left axis deviation Nonspecific T-wave abnormality likely due to slight amount of artifact, but no acute ischemic ST abnormality is noted. Minimally prolonged QT. QRS 102 PR 182 QTc 474  This EKG appears to be normal sinus with left axis deviation without obvious ischemic change but some artifact present. ____________________________________________  RADIOLOGY   DG Chest 2 View (Final result) Result time: 04/30/15 09:48:45   Final result by Rad Results In Interface (04/30/15 09:48:45)   Narrative:   CLINICAL DATA: Lung cancer.  EXAM: CHEST 2 VIEW  COMPARISON: CT 04/21/2015. Chest x-ray 04/21/2015, 01/28/2015.  FINDINGS: Prior CABG. Stable cardiomegaly. Persistent left hilar and suprahilar fullness noted consistent with known tumor/ adenopathy. Persistent chronic interstitial changes left lung base. Persistent bibasilar atelectasis. Previously identified right pulmonary nodules best identified by recent CT. Small left pleural effusion.  IMPRESSION: 1. Persistent left hilar/suprahilar fullness noted consistent with known tumor/adenopathy. 2. Persistent chronic interstitial changes left lung base with bibasilar subsegmental atelectasis. Small left pleural effusion. 3. Previous identified right pulmonary nodules best identified by recent CT. 4. Prior CABG. Stable cardiomegaly.   Electronically Signed By: Marcello Moores Register On: 04/30/2015 09:48          DG HIP UNILAT WITH PELVIS 2-3 VIEWS LEFT (Final result) Result time: 04/30/15 09:46:14   Final result  by Rad Results In Interface (04/30/15 09:46:14)   Narrative:   CLINICAL DATA: Weakness, inability to stand, severe  left hip pain new, history of stage IV squamous cell lung malignancy  EXAM: DG HIP (WITH OR WITHOUT PELVIS) 2-3V LEFT  COMPARISON: Abdominal pelvic CT scan of July '8 20 16  '$ FINDINGS: The bony pelvis is reasonably well mineralized. There is no lytic nor blastic lesion. The sacrum and SI joints are unremarkable. AP and frog-leg lateral views of the left hip reveal the bone to be adequately mineralized. There is no lytic or blastic lesion. The joint space is preserved. There is no acute fracture. There are dense calcifications in the external iliac arteries.  IMPRESSION: There is no acute fracture nor dislocation of the left hip. The bony pelvis exhibits no acute abnormality either.     ____________________________________________   PROCEDURES  Procedure(s) performed: None  Critical Care performed: No  ____________________________________________   INITIAL IMPRESSION / ASSESSMENT AND PLAN / ED COURSE  Pertinent labs & imaging results that were available during my care of the patient were reviewed by me and considered in my medical decision making (see chart for details).  Patient presents with fatigue, exertional dyspnea, and notably left hip pain that prevented him from walking. He has been taking 15 mg of oxycodone at home for this and states the pain is still ongoing to the point that he cannot walk. He is having difficulty with walking and has had several falls in the last few months none recently however.  Patient does notably develop hypoxia with speaking. I suspect he is becoming hypoxic at home with attempts to ambulate and move in addition he has chronic with acute worsening of pain in his left hip which is believed due to metastasis based on history. We will treat his pain, obtain chest x-ray and hip x-rays, basic labs. He denies any acute cardiac symptoms. Does not appear to be in acute pulmonary distress, but I will give a nebulizer treatment to see if this helped  improve his hypoxia. He presently has no infectious symptoms.  I have placed a consultation to palliative care as well as social work for assistance in finding a disposition for this patient, as I suspect that he can likely not go home based on his current living situation. ____________________________________________  ----------------------------------------- 1:17 PM on 04/30/2015 -----------------------------------------  I have discussed with Dr. Grayland Ormond as well as palliative care team nurse. After discussion with the patient and his family at the bedside, the patient would like to pursue palliative care options and is agreeable with transfer to the hospice home. He is well coming of this. We have discussed and I have reviewed with him and his family and he affirms his DO NOT RESUSCITATE status. He would not wish to be on a ventilator or intubated. Should his heart stop he would not want restarted. I have completed his DO NOT RESUSCITATE paperwork.  He is comfortable with the plan to go to the hospice home where he will have nursing care there. Hospice home is currently arranging for his transfer.  It is my belief that the patient is likely suffering from ongoing tumor burden which is leading to his slight hypoxia which is comfortable returning to fatigue. His metastasis to the left pelvis is the cause of his left-sided lower back and hip pain. I am comfortable with his decision to transfer the hospice home. Dr. Grayland Ormond advises and also recommends this.   FINAL CLINICAL IMPRESSION(S) / ED DIAGNOSES  Final diagnoses:  Dyspnea  Malignant neoplasm of lung, unspecified laterality, unspecified part of lung  Physical deconditioning      Delman Kitten, MD 04/30/15 1555

## 2015-04-30 NOTE — Progress Notes (Signed)
New referral for Hospice services received from [facility]. Mr. Capelli is a 74 year old man with a known history of squamous cell cancer of the left upper lobe. He was admitted to [facility] from 7/4 to 7/8 for treatment of dyspnea and chest pain with an abnormal CT scan showing mets to the left hip. He was discharged with plan for XRT for pain control and had radiation yesterday 7/12. He returned to the ED today via EMS accompanied by his sister for increased weakness and inability to stand. He is also now requiring oxygen for dyspnea. Writer met with patient's sister Kyra Manges, hospice services at home were reviewed. Mary related the decline that Mr. Chancy has had over the past week and the increased weakness and pain he has been experiencing.She reports he has been sleeping more and and has had a decreased appetite. Writer reviewed Mr. Sandy' information with Hospice Medical Director and at this time it appears that he is appropriate for transfer to the hospice home. Writer met again with Stanton Kidney and also spoke directly to Mr. Stabenow. Both are in agreement for comfort at the hospice home. Consents signed and faxed along with patient information to referral intake.  Attending physician Dr. Jacqualine Code in to confirm patient's request for DNR status.  Mr. Valdez denied pain during visit, he stated that when he has pain it in his L hip and leg, he has had 4 mg of IV morphine for pain in the ED. Report called to hospice home, MD, CM and RN all aware of plan for patient to transfer via EMS to the hospice home with portable DNR in place. Thank you for the opportunity to serve Jerry Davenport.  Flo Shanks RN, BSN, [place] [facility] [place], [facility] [phone] c

## 2015-04-30 NOTE — ED Notes (Signed)
Pt presents from home where he lives with his sister. Per EMS pt was seen here on Friday and was treated for pneumonia, also was dx with tumor to his L hip. 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 EMS also states that per sister pt has increased weakness today, to the point where he was unable to stand. EMS states no fever. Pt has appt with cancer center for radiation today.

## 2015-04-30 NOTE — Care Management Note (Signed)
Case Management Note  Patient Details  Name: XZAVIER SWINGER MRN: [id number] Date of Birth: 02-12-1941  Subjective/Objective:    Dr Gary Fleet office says the [facility] is their preferred agency. Flo Shanks contacted to come see the pt. And his sister in the ER. Dr. Jacqualine Code made aware.                Action/Plan:   Expected Discharge Date:                  Expected Discharge Plan:     In-House Referral:     Discharge planning Services     Post Acute Care Choice:    Choice offered to:     DME Arranged:    DME Agency:     HH Arranged:    [place] Agency:     Status of Service:     Medicare Important Message Given:    Date Medicare IM Given:    Medicare IM give by:    Date Additional Medicare IM Given:    Additional Medicare Important Message give by:     If discussed at [place] of Stay Meetings, dates discussed:    Additional Comments:  Beau Fanny, RN 04/30/2015, 10:58 AM

## 2015-04-30 NOTE — Care Management Note (Signed)
Case Management Note  Patient Details  Name: Jerry Davenport MRN: [id number] Date of Birth: [birth year]  Subjective/Objective:     Pt. Assessed by Flo Shanks, [place] [facility], and pt. Is going to [facility].               Action/Plan:   Expected Discharge Date:                  Expected Discharge Plan:     In-House Referral:     Discharge planning Services     Post Acute Care Choice:    Choice offered to:     DME Arranged:    DME Agency:     HH Arranged:    [place] Agency:     Status of Service:     Medicare Important Message Given:    Date Medicare IM Given:    Medicare IM give by:    Date Additional Medicare IM Given:    Additional Medicare Important Message give by:     If discussed at [place] of Stay Meetings, dates discussed:    Additional Comments:  Beau Fanny, RN 04/30/2015, 12:47 PM

## 2015-04-30 NOTE — ED Notes (Signed)
Patient transported to X-ray 

## 2015-04-30 NOTE — Patient Outreach (Signed)
[place] [facility]) Care Management  04/30/2015  Jerry Davenport 1941-08-16 [id number]   Referral from [facility] ED, Merlene Morse Minor, RN assigned to outreach.  Ronnell Freshwater. [place], [place] Management [place] Assistant Phone: [phone] Fax: [phone]

## 2015-04-30 NOTE — Discharge Instructions (Signed)
Please set up close follow-up with Dr. Grayland Ormond. Continue your current outpatient medications as previously prescribed. Return to the emergency room should she feel that her pain cannot be managed at the hospice house, you have new concerns, develop a fever, pass out, or other new concerns or symptoms arise.  Metastatic Cancer, Questions and Answers KEY POINTS  Cancer happens when cells become abnormal and grow without control.  Where the cancer started is called the primary cancer or the primary tumor.  Metastatic cancer happens when cancer cells spread from the place where it started to other parts of the body.  When cancer spreads, the metastatic cancer keeps the same type of cells and the same name as the primary tumor.  The most common sites of metastasis are the lungs, bones, liver, and brain.  Treatment for metastatic cancer usually depends on the type of cancer. It also depends on the size and location of the metastasis. WHAT IS CANCER?   Cancer is a group of many related diseases. All cancers begin in cells. Cells are the building blocks that make up tissues. Cancer that arises from organs and solid tissues is called a solid tumor. Cancer that begins in blood cells is called leukemia, multiple myeloma, or lymphoma.  Normally, cells grow and divide to form new cells as the body needs them. When cells grow old and die, new cells take their place. Sometimes this orderly process goes wrong. New cells form when the body does not need them. Old cells do not die when they should.  The extra cells form a mass of tissue. This is called a growth or tumor. Tumors can be either not cancerous (benign) or cancerous (malignant). Benign tumors do not spread to other parts of the body. They are rarely a threat to life. Malignant tumors can spread (metastasize) and may be life threatening. WHAT IS PRIMARY CANCER?  Cancer can begin in any organ or tissue of the body. The original tumor is called the  primary cancer or primary tumor. It is usually named for the part of the body or the type of cell in which it begins. WHAT IS METASTASIS, AND HOW DOES IT HAPPEN?   Metastasis means the spread of cancer. Cancer cells can break away from a primary tumor and enter the bloodstream or lymphatic system. This is the system that produces, stores, and carries the cells that fight infections. That is how cancer cells spread to other parts of the body.  When cancer cells spread and form a new tumor in a different organ, the new tumor is a metastatic tumor. The cells in the metastatic tumor come from the original tumor. For example, if breast cancer spreads to the lungs, the metastatic tumor in the lung is made up of cancerous breast cells. It is not made of lung cells. In this case, the disease in the lungs is metastatic breast cancer (not lung cancer). Under a microscope, metastatic breast cancer cells generally look the same as the cancer cells in the breast. [place]?   Cancer cells can spread to almost any part of the body. Cancer cells frequently spread to lymph nodes (rounded masses of lymphatic tissue) near the primary tumor (regional lymph nodes). This is called lymph node involvement or regional disease. Cancer that spreads to other organs or to lymph nodes far from the primary tumor is called metastatic disease. Caregivers sometimes also call this distant disease.  The most common sites of metastasis from solid tumors are the  lungs, bones, liver, and brain. Some cancers tend to spread to certain parts of the body. For example, lung cancer often metastasizes to the brain or bones. Colon cancer often spreads to the liver. Prostate cancer tends to spread to the bones. Breast cancer commonly spreads to the bones, lungs, liver, or brain. But each of these cancers can spread to other parts of the body as well.  Because blood cells travel throughout the body, leukemia, multiple myeloma, and  lymphoma cells are usually not localized when the cancer is diagnosed. Tumor cells may be found in the blood, several lymph nodes, or other parts of the body such as the liver or bones. This type of spread is not referred to as metastasis. ARE THERE SYMPTOMS OF METASTATIC CANCER?   Some people with metastatic cancer do not have symptoms. Their metastases are found by X-rays and other tests performed for other reasons.  When symptoms of metastatic cancer occur, the type and frequency of the symptoms will depend on the size and location of the metastasis. For example, cancer that spreads to the bones is likely to cause pain and can lead to bone fractures. Cancer that spreads to the brain can cause a variety of symptoms. These include headaches, seizures, and unsteadiness. Shortness of breath may be a sign of lung involvement. Abdominal swelling or yellowing of the skin (jaundice) can indicate that cancer has spread to the liver.  Sometimes a person's primary cancer is discovered only after the metastatic tumor causes symptoms. For example, a man whose prostate cancer has spread to the bones in his pelvis may have lower back pain (caused by the cancer in his bones) before he experiences any symptoms from the primary tumor in his prostate. HOW DOES THE CAREGIVER KNOW WHETHER A CANCER IS PRIMARY OR A METASTATIC TUMOR?  To determine whether a tumor is primary or metastatic, the tumor will be examined under a microscope. In general, cancer cells look like abnormal versions of cells in the tissue where the cancer began. Using specialized diagnostic tests, a trained person is often able to tell where the cancer cells came from. Markers or antigens found in or on the cancer cells can indicate the primary site of the cancer.  Metastatic cancers may be found before or at the same time as the primary tumor, or months or years later. When a new tumor is found in a patient who has been treated for cancer in the past,  it is more often a metastasis than another primary tumor. IS IT POSSIBLE TO HAVE A METASTATIC TUMOR WITHOUT HAVING A PRIMARY CANCER?  No. A metastatic tumor always starts from cancer cells in another part of the body. In most cases, when a metastatic tumor is found first, the primary tumor can be found. The search for the primary tumor may involve lab tests, X-rays, and other procedures. However, in a small number of cases, a metastatic tumor is diagnosed but the primary tumor cannot be found, in spite of extensive tests. The tumor is metastatic because the cells are not like those in the organ or tissue in which the tumor is found. The primary tumor is called unknown or hidden (occult). The patient is said to have cancer of unknown primary origin (CUP). Because diagnostic techniques are constantly improving, the number of cases of CUP is going down.  WHAT TREATMENTS ARE USED FOR METASTATIC CANCER?   When cancer has metastasized, it may be treated with:  Chemotherapy.  Radiation therapy.  Biological therapy.  Hormone therapy.  Surgery.  Cryosurgery.  A combination of these.  The choice of treatment generally depends on the:  Type of primary cancer.  Size and location of the metastasis.  Patient's age and general health.  Types of treatments the patient has had in the past. In patients with CUP, it is possible to treat the disease even though the primary tumor has not been located. The goal of treatment may be to control the cancer, or to relieve symptoms or side effects of treatment. ARE NEW TREATMENTS FOR METASTATIC CANCER BEING DEVELOPED?  Yes, many new cancer treatments are under study. To develop new treatments, the [place] sponsors clinical trials (research studies) with cancer patients in many hospitals, universities, medical schools, and cancer centers around the country. Clinical trials are a critical step in the improvement of treatment. Before any new treatment can be  recommended for general use, doctors conduct studies to find out whether the treatment is both safe for patients and effective against the disease. The results of such studies have led to progress not only in the treatment of cancer, but in the detection, diagnosis, and prevention of the disease as well. Patients interested in taking part in a clinical trial should talk with their caregivers. [place] ([place]): www.cancer.gov Document Released: 02/08/2005 Document Revised: 12/27/2011 Document Reviewed: 09/26/2008 [facility] Patient Information 2015 [place], [place]. This information is not intended to replace advice given to you by your health care provider. Make sure you discuss any questions you have with your health care provider.

## 2015-04-30 NOTE — ED Notes (Signed)
Pending hospice arrangements.

## 2015-04-30 NOTE — Care Management Note (Signed)
Case Management Note  Patient Details  Name: Jerry Davenport MRN: [id number] Date of Birth: 05/05/1941  Subjective/Objective:   Contacted by Dr Jacqualine Code, as he believes pt. Should have Hospice care, and Palliative care is unavailable today. We have contacted CSW to also visit pt. And his sister. Pt. Currently with sister at her home for care and getting visits by [facility] care.  Sister has stated she cannot care for pt. In this state where he is unable to walk , and has SOB.  02 sats were 100% documented on arrival. Pt. Has dropped to 87% and 02 applied.  Dr. Grayland Ormond has talked with Dr Jacqualine Code , and agreed to set up Hospice services himself. We are awaiting a call back from [facility] to see if they would cover the home 02.   Action/Plan:   Expected Discharge Date:                  Expected Discharge Plan:     In-House Referral:     Discharge planning Services     Post Acute Care Choice:    Choice offered to:     DME Arranged:    DME Agency:     HH Arranged:    [place] Agency:     Status of Service:     Medicare Important Message Given:    Date Medicare IM Given:    Medicare IM give by:    Date Additional Medicare IM Given:    Additional Medicare Important Message give by:     If discussed at [place] of Stay Meetings, dates discussed:    Additional Comments:  Beau Fanny, RN 04/30/2015, 10:46 AM

## 2015-05-01 ENCOUNTER — Ambulatory Visit: Payer: Commercial Managed Care - HMO

## 2015-05-02 ENCOUNTER — Ambulatory Visit: Payer: Commercial Managed Care - HMO

## 2015-05-02 ENCOUNTER — Inpatient Hospital Stay: Payer: Commercial Managed Care - HMO | Admitting: Family Medicine

## 2015-05-05 ENCOUNTER — Ambulatory Visit: Payer: Commercial Managed Care - HMO

## 2015-05-06 ENCOUNTER — Ambulatory Visit: Payer: Commercial Managed Care - HMO | Admitting: Radiation Oncology

## 2015-05-06 ENCOUNTER — Ambulatory Visit: Payer: Commercial Managed Care - HMO

## 2015-05-06 ENCOUNTER — Other Ambulatory Visit: Payer: Self-pay | Admitting: *Deleted

## 2015-05-06 NOTE — Patient Outreach (Signed)
RNCM attempted to contact pt via the home number listed and the cell number listed and both returned a message that "this person was not available to be reached at this time" But there was no opportunity to leave a message.   Plan: RNCM will attempt to contact pt tomorrow.

## 2015-05-07 ENCOUNTER — Other Ambulatory Visit: Payer: Self-pay | Admitting: *Deleted

## 2015-05-07 ENCOUNTER — Ambulatory Visit: Payer: Commercial Managed Care - HMO

## 2015-05-07 NOTE — Patient Outreach (Signed)
RNCM called two available numbers for pt with no success. Automated message stated the person you are trying to reach is not accepting phone calls at this time.   Plan: RNCM will attempt to contact pt again on Friday.  Rutherford Limerick RN, BSN  [facility] Care Management [phone])

## 2015-05-08 ENCOUNTER — Ambulatory Visit: Payer: Commercial Managed Care - HMO

## 2015-05-08 ENCOUNTER — Telehealth: Payer: Self-pay

## 2015-05-08 NOTE — Telephone Encounter (Signed)
Patient died this morning at hospice I attempted to call family but none of numbers are working. I will send a sympathy card today by mail. Theresia Majors CMA

## 2015-05-08 NOTE — Telephone Encounter (Signed)
Thanks for letting me know.-jh

## 2015-05-09 ENCOUNTER — Ambulatory Visit: Payer: Commercial Managed Care - HMO

## 2015-05-12 ENCOUNTER — Ambulatory Visit: Payer: Commercial Managed Care - HMO

## 2015-05-13 ENCOUNTER — Ambulatory Visit: Payer: Commercial Managed Care - HMO

## 2015-05-14 ENCOUNTER — Ambulatory Visit: Payer: Commercial Managed Care - HMO

## 2015-05-15 ENCOUNTER — Ambulatory Visit: Payer: Commercial Managed Care - HMO

## 2015-05-16 ENCOUNTER — Encounter: Payer: Self-pay | Admitting: *Deleted

## 2015-05-16 ENCOUNTER — Ambulatory Visit: Payer: Commercial Managed Care - HMO

## 2015-05-19 ENCOUNTER — Ambulatory Visit: Payer: Commercial Managed Care - HMO

## 2015-05-19 DEATH — deceased

## 2015-05-20 ENCOUNTER — Ambulatory Visit: Payer: Commercial Managed Care - HMO

## 2015-05-21 ENCOUNTER — Ambulatory Visit: Payer: Commercial Managed Care - HMO

## 2015-05-22 ENCOUNTER — Ambulatory Visit: Payer: Commercial Managed Care - HMO

## 2015-05-26 NOTE — Patient Outreach (Signed)
[place] [facility]) Care Management  05/26/2015  Jerry Davenport [birth year] [id number]   Notification from [place], RN patient passed away.  Ronnell Freshwater. [place], [place] Management [place] Assistant Phone: [phone] Fax: [phone]

## 2015-06-27 ENCOUNTER — Encounter: Payer: Self-pay | Admitting: Internal Medicine

## 2015-10-16 ENCOUNTER — Other Ambulatory Visit: Payer: Self-pay | Admitting: Nurse Practitioner

## 2016-12-10 IMAGING — CT CT CHEST W/O CM
2 of 3 series · 15 of 36 positions shown, 18 images · non-contrast
Comparison: Chest x-ray 12/09/2014.  Chest CT 03/04/2014.

CLINICAL DATA: Follow-up chest x-ray.

EXAM:
CT CHEST WITHOUT CONTRAST
TECHNIQUE: Multidetector CT imaging of the chest was performed following the
standard protocol without IV contrast..

[Series 3: routine chest wo · axial · 0.75mm/px · z∈[-799,-514]mm · 12 of 67 slices shown, 15 images]
[im 5/67  mediastinal]
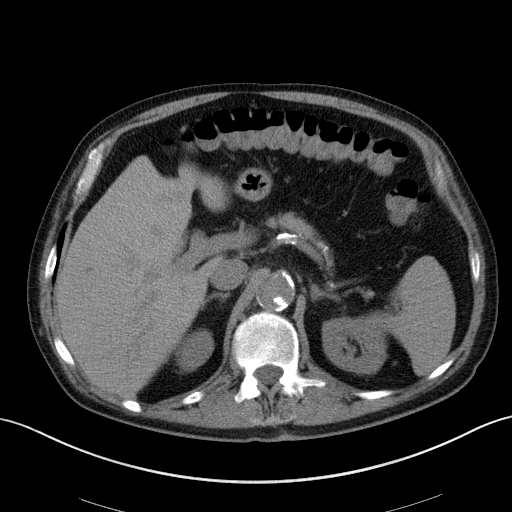
[im 5/67  lung]
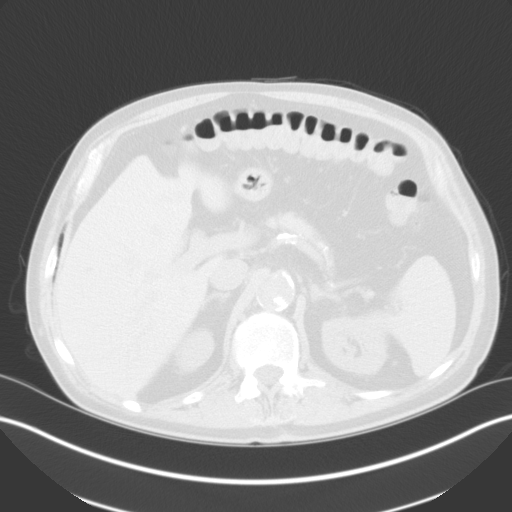
[im 10/67  lung]
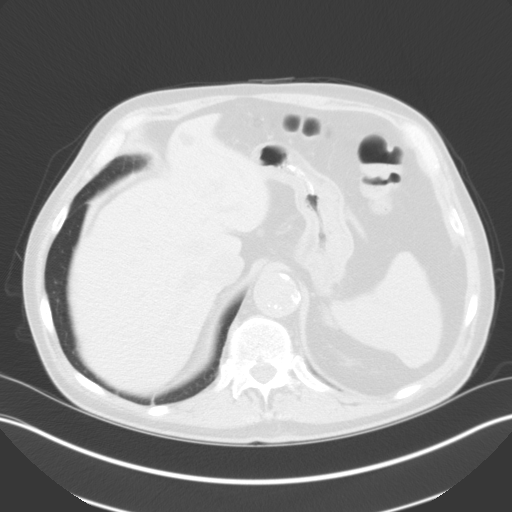
[im 15/67  lung]
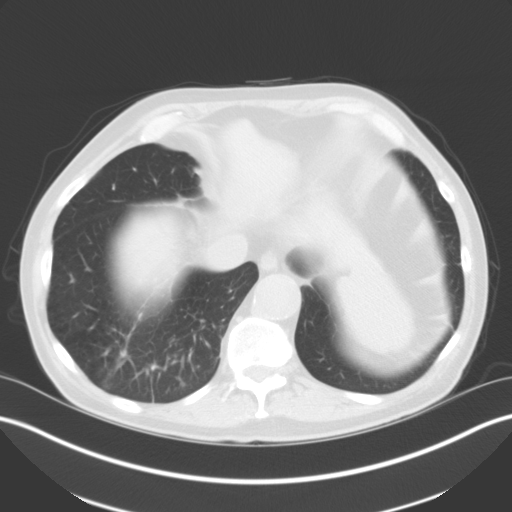
[im 20/67  lung]
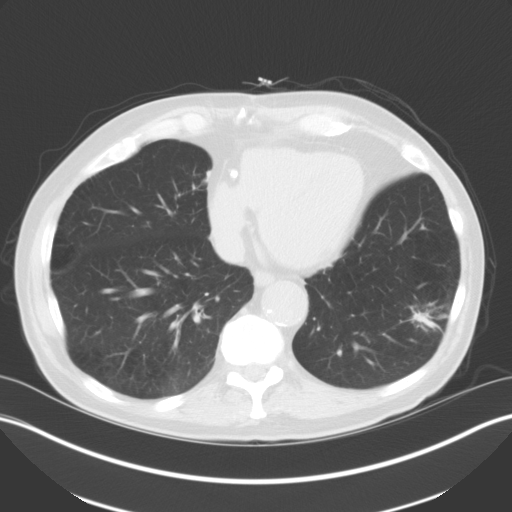
[im 25/67  mediastinal]
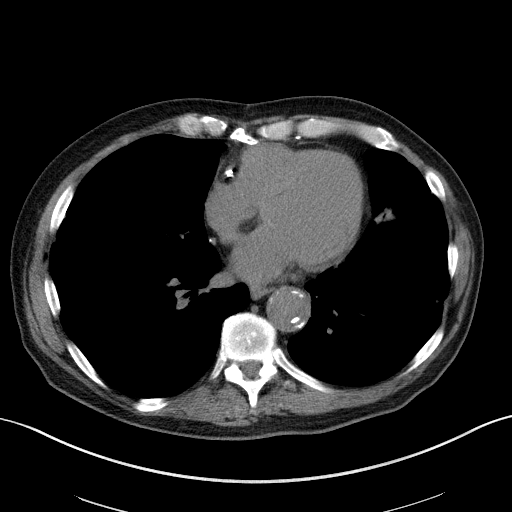
[im 25/67  lung]
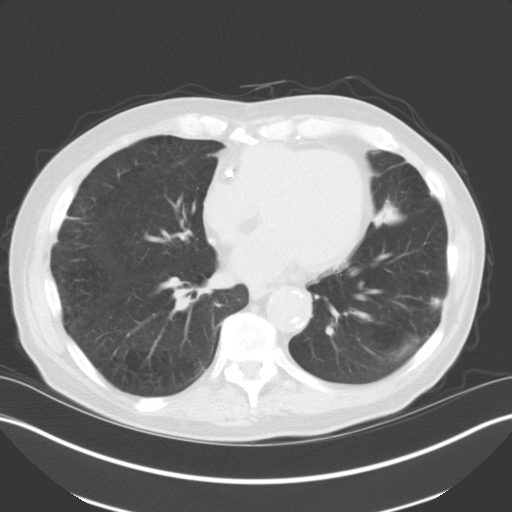
[im 30/67  lung]
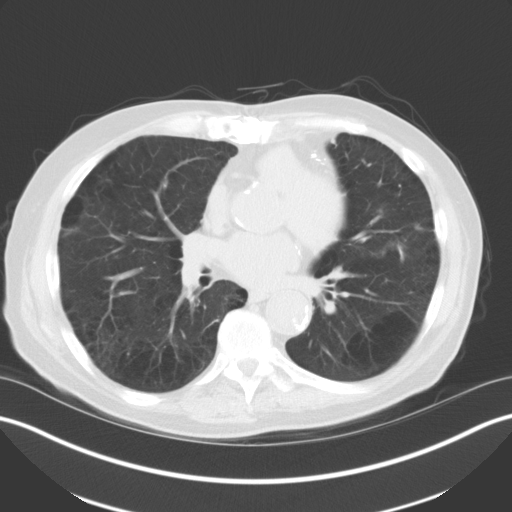
[im 37/67  lung]
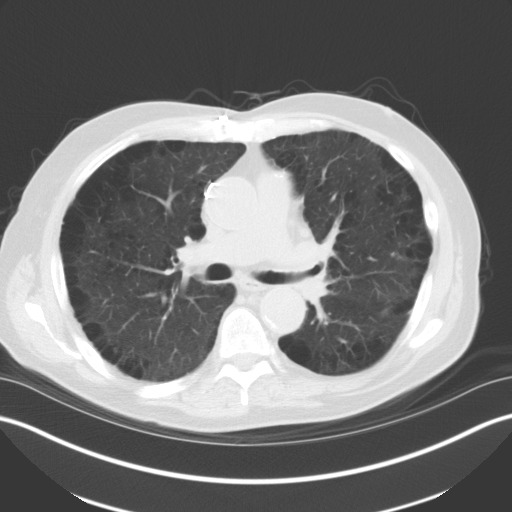
[im 42/67  lung]
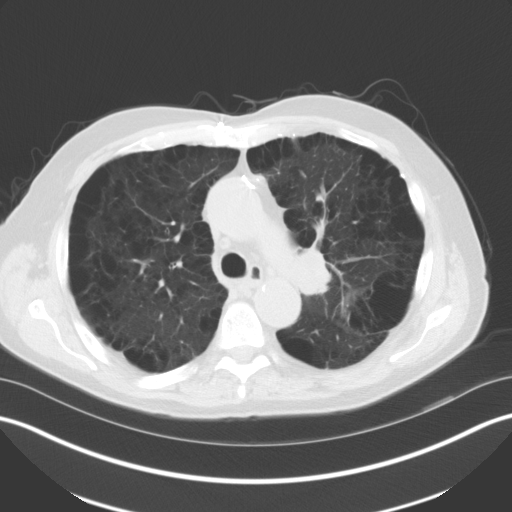
[im 47/67  mediastinal]
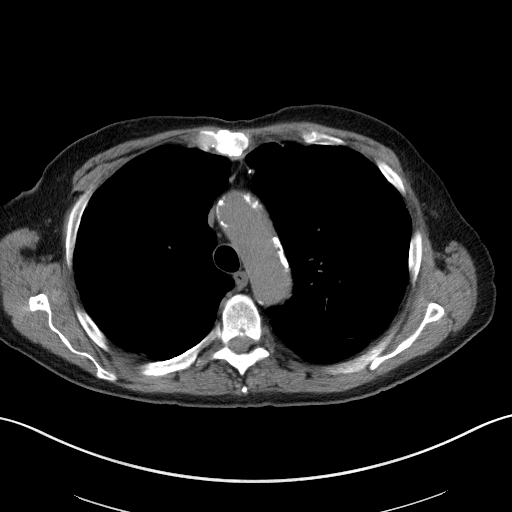
[im 47/67  lung]
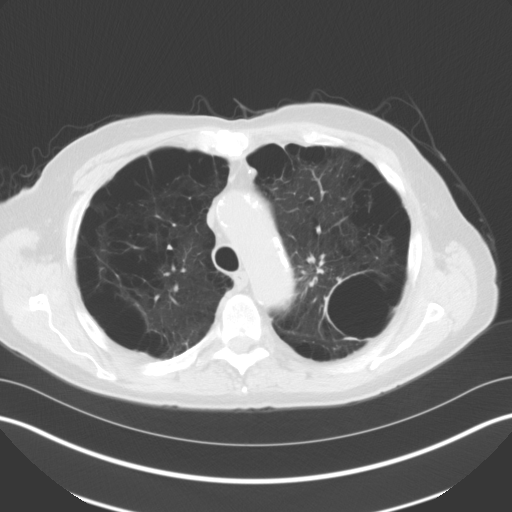
[im 52/67  lung]
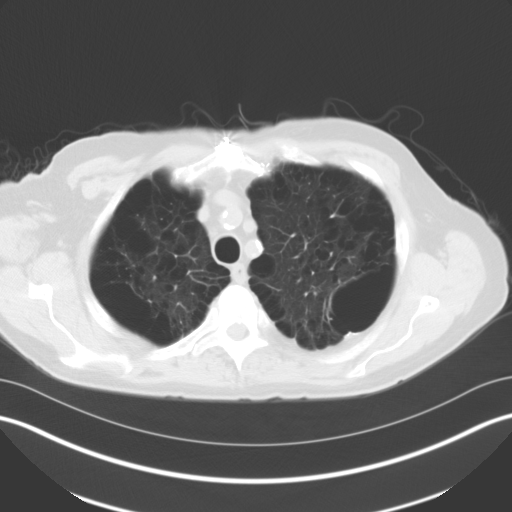
[im 57/67  lung]
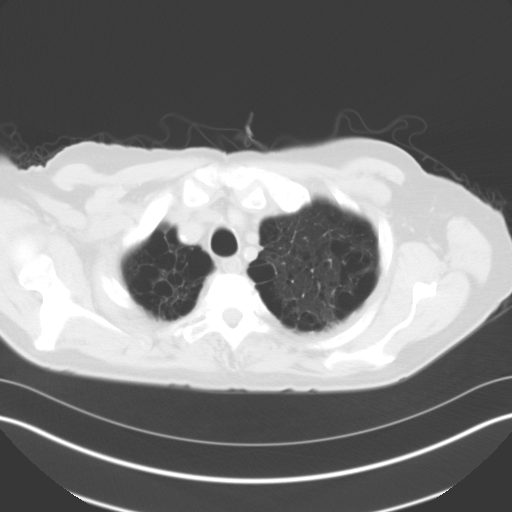
[im 62/67  lung]
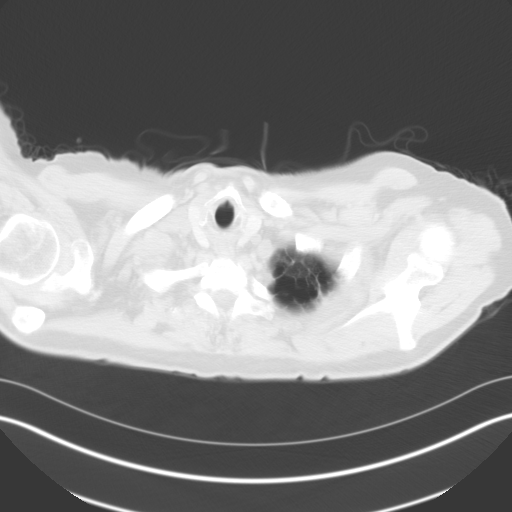

[Series 5: cor routine chest wo · coronal · 0.66mm/px · 3 of 138 slices shown]
[im 28/138  lung]
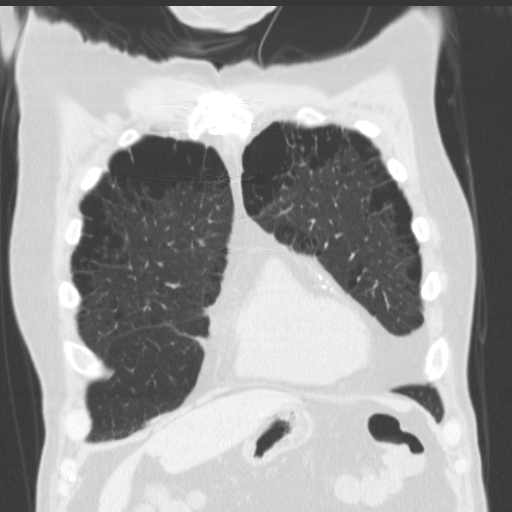
[im 55/138  lung]
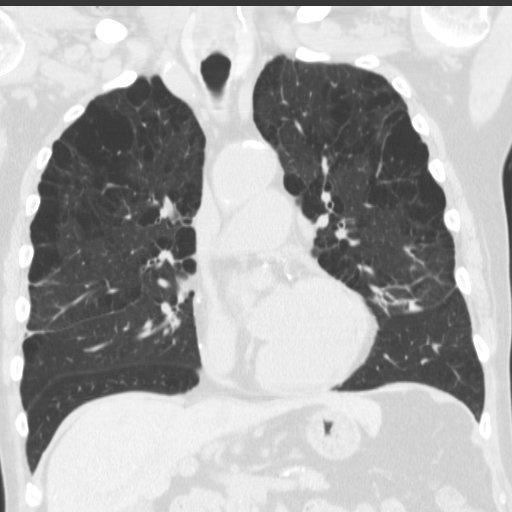
[im 83/138  lung]
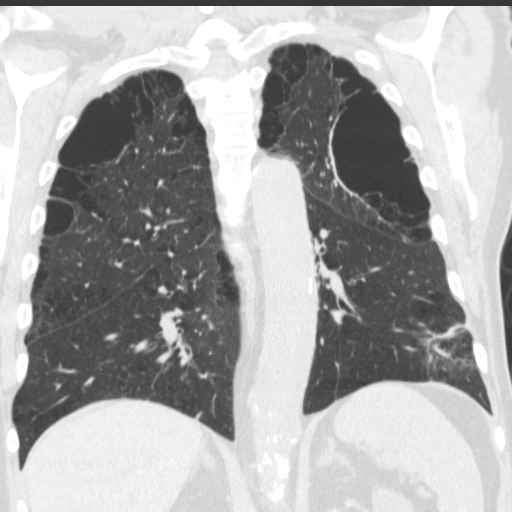

[15 of 36 positions shown; findings below may reference images not displayed]

FINDINGS: CT CHEST FINDINGS

Mediastinum/Nodes: Prior CABG. Heart is normal size. Aorta is normal
caliber. Densely calcified aorta. No mediastinal, hilar, or axillary
adenopathy.

Lungs/Pleura: Moderate centrilobular and severe paraseptal
emphysema. Previously seen left upper lobe stable lingular scarring
is stable. The previously seen soft tissue fullness in the left
upper lobe suprahilar region has enlarged. This is difficult to
separate from adjacent vessels, but measures approximately 3.2 x
cm compared with 9 mm maximally previously. This is concerning for
lung cancer.

Left lower lobe sub solid nodule again noted and not significantly
changed, approximately 3.4 x 3.0 cm on image 44. Sub solid nodular
area now present in the right lower lobe posteriorly, new since
prior study, along with linear densities, presumably atelectasis. No
pleural effusions.

Musculoskeletal: Chest wall soft tissues are unremarkable. No acute
bony abnormality or focal bone lesion.

Upper abdomen: Imaging into the upper abdomen shows no acute
findings.
IMPRESSION: Enlarging left upper lobe/suprahilar central mass now measuring up
to 3.2 cm compared with 9 mm previously. This is concerning for lung
cancer. This could be further evaluated with PET CT.

Stable left lower lobe sub solid area. New sub solid area
posteriorly in the right lower lobe likely reflects atelectasis.

Moderate to severe centrilobular and paraseptal emphysema.
